# Patient Record
Sex: Female | Born: 1965 | Race: Black or African American | Hispanic: No | Smoking: Former smoker
Health system: Southern US, Community
[De-identification: ages and names within clinical notes are randomized; demographics above are authoritative.]

## PROBLEM LIST (undated history)

## (undated) DIAGNOSIS — N186 End stage renal disease: Secondary | ICD-10-CM

## (undated) DIAGNOSIS — I10 Essential (primary) hypertension: Secondary | ICD-10-CM

## (undated) DIAGNOSIS — J984 Other disorders of lung: Secondary | ICD-10-CM

## (undated) DIAGNOSIS — J449 Chronic obstructive pulmonary disease, unspecified: Secondary | ICD-10-CM

## (undated) DIAGNOSIS — J84116 Cryptogenic organizing pneumonia: Secondary | ICD-10-CM

## (undated) HISTORY — PX: OTHER SURGICAL HISTORY: SHX169

---

## 2006-11-15 ENCOUNTER — Encounter: Admission: RE | Admit: 2006-11-15 | Discharge: 2006-11-15 | Payer: Self-pay | Admitting: Sports Medicine

## 2007-10-19 ENCOUNTER — Encounter: Admission: RE | Admit: 2007-10-19 | Discharge: 2007-10-19 | Payer: Self-pay | Admitting: Orthopedic Surgery

## 2007-11-16 ENCOUNTER — Encounter: Admission: RE | Admit: 2007-11-16 | Discharge: 2007-11-16 | Payer: Self-pay | Admitting: Orthopedic Surgery

## 2008-01-07 ENCOUNTER — Emergency Department (HOSPITAL_COMMUNITY): Admission: EM | Admit: 2008-01-07 | Discharge: 2008-01-07 | Payer: Self-pay | Admitting: Emergency Medicine

## 2008-01-23 ENCOUNTER — Emergency Department (HOSPITAL_COMMUNITY): Admission: EM | Admit: 2008-01-23 | Discharge: 2008-01-23 | Payer: Self-pay | Admitting: Emergency Medicine

## 2008-04-04 ENCOUNTER — Emergency Department (HOSPITAL_COMMUNITY): Admission: EM | Admit: 2008-04-04 | Discharge: 2008-04-04 | Payer: Self-pay | Admitting: Emergency Medicine

## 2009-02-13 ENCOUNTER — Emergency Department (HOSPITAL_COMMUNITY): Admission: EM | Admit: 2009-02-13 | Discharge: 2009-02-13 | Payer: Self-pay | Admitting: Emergency Medicine

## 2009-05-22 ENCOUNTER — Emergency Department (HOSPITAL_COMMUNITY): Admission: EM | Admit: 2009-05-22 | Discharge: 2009-05-22 | Payer: Self-pay | Admitting: Emergency Medicine

## 2009-10-05 ENCOUNTER — Emergency Department (HOSPITAL_COMMUNITY): Admission: EM | Admit: 2009-10-05 | Discharge: 2009-10-05 | Payer: Self-pay | Admitting: Emergency Medicine

## 2009-10-16 ENCOUNTER — Emergency Department (HOSPITAL_COMMUNITY): Admission: EM | Admit: 2009-10-16 | Discharge: 2009-10-16 | Payer: Self-pay | Admitting: Emergency Medicine

## 2009-10-17 ENCOUNTER — Inpatient Hospital Stay (HOSPITAL_COMMUNITY): Admission: EM | Admit: 2009-10-17 | Discharge: 2009-10-23 | Payer: Self-pay | Admitting: Emergency Medicine

## 2010-04-17 LAB — CULTURE, BLOOD (SINGLE): Culture  Setup Time: 201109160839

## 2010-04-17 LAB — RENAL FUNCTION PANEL
Albumin: 3.2 g/dL — ABNORMAL LOW (ref 3.5–5.2)
Albumin: 3.4 g/dL — ABNORMAL LOW (ref 3.5–5.2)
BUN: 20 mg/dL (ref 6–23)
BUN: 30 mg/dL — ABNORMAL HIGH (ref 6–23)
BUN: 63 mg/dL — ABNORMAL HIGH (ref 6–23)
CO2: 28 mEq/L (ref 19–32)
Calcium: 8.3 mg/dL — ABNORMAL LOW (ref 8.4–10.5)
Calcium: 8.5 mg/dL (ref 8.4–10.5)
Chloride: 100 mEq/L (ref 96–112)
Chloride: 92 mEq/L — ABNORMAL LOW (ref 96–112)
Chloride: 99 mEq/L (ref 96–112)
Creatinine, Ser: 11.04 mg/dL — ABNORMAL HIGH (ref 0.4–1.2)
Creatinine, Ser: 16.44 mg/dL — ABNORMAL HIGH (ref 0.4–1.2)
Creatinine, Ser: 8.64 mg/dL — ABNORMAL HIGH (ref 0.4–1.2)
GFR calc Af Amer: 5 mL/min — ABNORMAL LOW (ref >60)
GFR calc Af Amer: 6 mL/min — ABNORMAL LOW (ref >60)
GFR calc non Af Amer: 4 mL/min — ABNORMAL LOW (ref >60)
GFR calc non Af Amer: 5 mL/min — ABNORMAL LOW (ref >60)
Glucose, Bld: 105 mg/dL — ABNORMAL HIGH (ref 70–99)
Phosphorus: 3.2 mg/dL (ref 2.3–4.6)

## 2010-04-17 LAB — CBC
HCT: 26.7 % — ABNORMAL LOW (ref 36.0–46.0)
HCT: 28.7 % — ABNORMAL LOW (ref 36.0–46.0)
Hemoglobin: 9.1 g/dL — ABNORMAL LOW (ref 12.0–15.0)
Hemoglobin: 9.3 g/dL — ABNORMAL LOW (ref 12.0–15.0)
Hemoglobin: 9.8 g/dL — ABNORMAL LOW (ref 12.0–15.0)
MCH: 28.5 pg (ref 26.0–34.0)
MCH: 28.5 pg (ref 26.0–34.0)
MCH: 28.7 pg (ref 26.0–34.0)
MCH: 28.9 pg (ref 26.0–34.0)
MCHC: 34.1 g/dL (ref 30.0–36.0)
MCHC: 34.1 g/dL (ref 30.0–36.0)
MCHC: 34.6 g/dL (ref 30.0–36.0)
MCHC: 34.9 g/dL (ref 30.0–36.0)
MCV: 81 fL (ref 78.0–100.0)
MCV: 83.7 fL (ref 78.0–100.0)
Platelets: 124 10*3/uL — ABNORMAL LOW (ref 150–400)
Platelets: 143 10*3/uL — ABNORMAL LOW (ref 150–400)
Platelets: 147 10*3/uL — ABNORMAL LOW (ref 150–400)
RBC: 3.19 MIL/uL — ABNORMAL LOW (ref 3.87–5.11)
RBC: 3.62 MIL/uL — ABNORMAL LOW (ref 3.87–5.11)
RDW: 16 % — ABNORMAL HIGH (ref 11.5–15.5)
RDW: 16.2 % — ABNORMAL HIGH (ref 11.5–15.5)
RDW: 16.2 % — ABNORMAL HIGH (ref 11.5–15.5)
RDW: 16.2 % — ABNORMAL HIGH (ref 11.5–15.5)
WBC: 4.4 10*3/uL (ref 4.0–10.5)
WBC: 6.4 10*3/uL (ref 4.0–10.5)

## 2010-04-17 LAB — DIFFERENTIAL
Basophils Absolute: 0 10*3/uL (ref 0.0–0.1)
Basophils Relative: 0 % (ref 0–1)
Lymphocytes Relative: 22 % (ref 12–46)
Neutro Abs: 5.1 10*3/uL (ref 1.7–7.7)
Neutrophils Relative %: 64 % (ref 43–77)

## 2010-04-17 LAB — BASIC METABOLIC PANEL WITH GFR
BUN: 42 mg/dL — ABNORMAL HIGH (ref 6–23)
CO2: 30 meq/L (ref 19–32)
Calcium: 9.2 mg/dL (ref 8.4–10.5)
Chloride: 94 meq/L — ABNORMAL LOW (ref 96–112)
Creatinine, Ser: 13.77 mg/dL — ABNORMAL HIGH (ref 0.4–1.2)
GFR calc non Af Amer: 3 mL/min — ABNORMAL LOW
Glucose, Bld: 102 mg/dL — ABNORMAL HIGH (ref 70–99)
Potassium: 5 meq/L (ref 3.5–5.1)
Sodium: 137 meq/L (ref 135–145)

## 2010-04-17 LAB — POCT I-STAT, CHEM 8
Calcium, Ion: 1.06 mmol/L — ABNORMAL LOW (ref 1.12–1.32)
Chloride: 99 mEq/L (ref 96–112)
Creatinine, Ser: 15.1 mg/dL — ABNORMAL HIGH (ref 0.4–1.2)
Potassium: 4.9 mEq/L (ref 3.5–5.1)
Sodium: 135 mEq/L (ref 135–145)
TCO2: 29 mmol/L (ref 0–100)

## 2010-04-17 LAB — BASIC METABOLIC PANEL
BUN: 14 mg/dL (ref 6–23)
BUN: 26 mg/dL — ABNORMAL HIGH (ref 6–23)
BUN: 66 mg/dL — ABNORMAL HIGH (ref 6–23)
CO2: 28 mEq/L (ref 19–32)
Calcium: 8.6 mg/dL (ref 8.4–10.5)
Calcium: 9.6 mg/dL (ref 8.4–10.5)
Chloride: 100 mEq/L (ref 96–112)
Chloride: 93 mEq/L — ABNORMAL LOW (ref 96–112)
Creatinine, Ser: 6.77 mg/dL — ABNORMAL HIGH (ref 0.4–1.2)
Creatinine, Ser: 9.34 mg/dL — ABNORMAL HIGH (ref 0.4–1.2)
Creatinine, Ser: 19.04 mg/dL — ABNORMAL HIGH (ref 0.4–1.2)
GFR calc non Af Amer: 7 mL/min — ABNORMAL LOW (ref >60)
Glucose, Bld: 90 mg/dL (ref 70–99)
Potassium: 4.5 mEq/L (ref 3.5–5.1)
Potassium: 4.7 mEq/L (ref 3.5–5.1)

## 2010-04-17 LAB — POCT I-STAT 4, (NA,K, GLUC, HGB,HCT)
Glucose, Bld: 95 mg/dL (ref 70–99)
HCT: 36 % (ref 36.0–46.0)
Hemoglobin: 12.2 g/dL (ref 12.0–15.0)
Potassium: 4.1 meq/L (ref 3.5–5.1)
Sodium: 137 meq/L (ref 135–145)

## 2010-04-17 LAB — CULTURE, BLOOD (ROUTINE X 2)
Culture  Setup Time: 201109181201
Culture  Setup Time: 201109181204
Culture: NO GROWTH

## 2010-04-17 LAB — BLOOD GAS, ARTERIAL
Acid-Base Excess: 5.5 mmol/L — ABNORMAL HIGH (ref 0.0–2.0)
Drawn by: 252031
O2 Content: 4 L/min
O2 Saturation: 85.1 %
pO2, Arterial: 51.8 mmHg — ABNORMAL LOW (ref 80.0–100.0)

## 2010-04-17 LAB — COMPREHENSIVE METABOLIC PANEL
ALT: <8 U/L (ref 0–35)
AST: 14 U/L (ref 0–37)
Albumin: 3.8 g/dL (ref 3.5–5.2)
BUN: 54 mg/dL — ABNORMAL HIGH (ref 6–23)
Chloride: 93 mEq/L — ABNORMAL LOW (ref 96–112)
GFR calc non Af Amer: 3 mL/min — ABNORMAL LOW (ref >60)
Potassium: 5.9 mEq/L — ABNORMAL HIGH (ref 3.5–5.1)
Sodium: 136 mEq/L (ref 135–145)
Total Protein: 7.7 g/dL (ref 6.0–8.3)

## 2010-04-17 LAB — PHOSPHORUS: Phosphorus: 4.5 mg/dL (ref 2.3–4.6)

## 2010-04-17 LAB — MRSA PCR SCREENING: MRSA by PCR: NEGATIVE

## 2010-04-17 LAB — VANCOMYCIN, RANDOM

## 2010-04-20 LAB — DIFFERENTIAL
Eosinophils Absolute: 0.5 10*3/uL (ref 0.0–0.7)
Eosinophils Relative: 7 % — ABNORMAL HIGH (ref 0–5)
Lymphs Abs: 0.7 10*3/uL (ref 0.7–4.0)
Monocytes Absolute: 0.6 10*3/uL (ref 0.1–1.0)
Monocytes Relative: 8 % (ref 3–12)

## 2010-04-20 LAB — POCT CARDIAC MARKERS
Myoglobin, poc: 251 ng/mL (ref 12–200)
Troponin i, poc: <0.05 ng/mL (ref 0.00–0.09)
Troponin i, poc: <0.05 ng/mL (ref 0.00–0.09)

## 2010-04-20 LAB — PROTIME-INR: INR: 1 (ref 0.00–1.49)

## 2010-04-20 LAB — CBC
HCT: 36 % (ref 36.0–46.0)
Hemoglobin: 12.3 g/dL (ref 12.0–15.0)
MCHC: 34.1 g/dL (ref 30.0–36.0)
MCV: 94.3 fL (ref 78.0–100.0)
RBC: 3.82 MIL/uL — ABNORMAL LOW (ref 3.87–5.11)
WBC: 7.3 10*3/uL (ref 4.0–10.5)

## 2010-04-20 LAB — POCT I-STAT, CHEM 8
BUN: 19 mg/dL (ref 6–23)
Calcium, Ion: 1.24 mmol/L (ref 1.12–1.32)
Chloride: 96 mEq/L (ref 96–112)
Creatinine, Ser: 8.3 mg/dL — ABNORMAL HIGH (ref 0.4–1.2)
Glucose, Bld: 85 mg/dL (ref 70–99)

## 2010-04-22 LAB — CBC
HCT: 32.5 % — ABNORMAL LOW (ref 36.0–46.0)
Platelets: 160 10*3/uL (ref 150–400)
RDW: 15.6 % — ABNORMAL HIGH (ref 11.5–15.5)

## 2010-04-22 LAB — DIFFERENTIAL
Basophils Absolute: 0 10*3/uL (ref 0.0–0.1)
Eosinophils Absolute: 0.5 10*3/uL (ref 0.0–0.7)
Eosinophils Relative: 6 % — ABNORMAL HIGH (ref 0–5)
Lymphocytes Relative: 19 % (ref 12–46)

## 2010-04-22 LAB — POCT I-STAT, CHEM 8
Hemoglobin: 11.9 g/dL — ABNORMAL LOW (ref 12.0–15.0)
Sodium: 134 mEq/L — ABNORMAL LOW (ref 135–145)
TCO2: 34 mmol/L (ref 0–100)

## 2010-05-15 LAB — BASIC METABOLIC PANEL
Calcium: 10.6 mg/dL — ABNORMAL HIGH (ref 8.4–10.5)
GFR calc Af Amer: 4 mL/min — ABNORMAL LOW (ref >60)
GFR calc non Af Amer: 3 mL/min — ABNORMAL LOW (ref >60)
Sodium: 135 mEq/L (ref 135–145)

## 2010-05-15 LAB — CBC
Hemoglobin: 13.5 g/dL (ref 12.0–15.0)
RBC: 4.31 MIL/uL (ref 3.87–5.11)

## 2010-11-07 LAB — POCT I-STAT, CHEM 8
BUN: 46 mg/dL — ABNORMAL HIGH (ref 6–23)
Calcium, Ion: 1.15 mmol/L (ref 1.12–1.32)
Chloride: 97 mEq/L (ref 96–112)
Creatinine, Ser: 10.4 mg/dL — ABNORMAL HIGH (ref 0.4–1.2)
Glucose, Bld: 76 mg/dL (ref 70–99)
HCT: 43 % (ref 36.0–46.0)
Hemoglobin: 14.6 g/dL (ref 12.0–15.0)
Potassium: 5.4 mEq/L — ABNORMAL HIGH (ref 3.5–5.1)
Sodium: 136 meq/L (ref 135–145)
TCO2: 36 mmol/L (ref 0–100)

## 2011-06-25 ENCOUNTER — Other Ambulatory Visit: Payer: Self-pay | Admitting: Nephrology

## 2013-02-03 DIAGNOSIS — D631 Anemia in chronic kidney disease: Secondary | ICD-10-CM | POA: Diagnosis not present

## 2013-02-03 DIAGNOSIS — L299 Pruritus, unspecified: Secondary | ICD-10-CM | POA: Diagnosis not present

## 2013-02-03 DIAGNOSIS — N039 Chronic nephritic syndrome with unspecified morphologic changes: Secondary | ICD-10-CM | POA: Diagnosis not present

## 2013-02-03 DIAGNOSIS — N186 End stage renal disease: Secondary | ICD-10-CM | POA: Diagnosis not present

## 2013-02-04 DIAGNOSIS — L299 Pruritus, unspecified: Secondary | ICD-10-CM | POA: Diagnosis not present

## 2013-02-04 DIAGNOSIS — D631 Anemia in chronic kidney disease: Secondary | ICD-10-CM | POA: Diagnosis not present

## 2013-02-04 DIAGNOSIS — N186 End stage renal disease: Secondary | ICD-10-CM | POA: Diagnosis not present

## 2013-02-07 DIAGNOSIS — N186 End stage renal disease: Secondary | ICD-10-CM | POA: Diagnosis not present

## 2013-02-07 DIAGNOSIS — N039 Chronic nephritic syndrome with unspecified morphologic changes: Secondary | ICD-10-CM | POA: Diagnosis not present

## 2013-02-07 DIAGNOSIS — D631 Anemia in chronic kidney disease: Secondary | ICD-10-CM | POA: Diagnosis not present

## 2013-02-07 DIAGNOSIS — L299 Pruritus, unspecified: Secondary | ICD-10-CM | POA: Diagnosis not present

## 2013-02-09 DIAGNOSIS — N186 End stage renal disease: Secondary | ICD-10-CM | POA: Diagnosis not present

## 2013-02-09 DIAGNOSIS — N039 Chronic nephritic syndrome with unspecified morphologic changes: Secondary | ICD-10-CM | POA: Diagnosis not present

## 2013-02-09 DIAGNOSIS — D631 Anemia in chronic kidney disease: Secondary | ICD-10-CM | POA: Diagnosis not present

## 2013-02-09 DIAGNOSIS — L299 Pruritus, unspecified: Secondary | ICD-10-CM | POA: Diagnosis not present

## 2013-02-11 DIAGNOSIS — N186 End stage renal disease: Secondary | ICD-10-CM | POA: Diagnosis not present

## 2013-02-11 DIAGNOSIS — L299 Pruritus, unspecified: Secondary | ICD-10-CM | POA: Diagnosis not present

## 2013-02-11 DIAGNOSIS — D631 Anemia in chronic kidney disease: Secondary | ICD-10-CM | POA: Diagnosis not present

## 2013-02-14 DIAGNOSIS — N039 Chronic nephritic syndrome with unspecified morphologic changes: Secondary | ICD-10-CM | POA: Diagnosis not present

## 2013-02-14 DIAGNOSIS — L299 Pruritus, unspecified: Secondary | ICD-10-CM | POA: Diagnosis not present

## 2013-02-14 DIAGNOSIS — N186 End stage renal disease: Secondary | ICD-10-CM | POA: Diagnosis not present

## 2013-02-14 DIAGNOSIS — D631 Anemia in chronic kidney disease: Secondary | ICD-10-CM | POA: Diagnosis not present

## 2013-02-16 DIAGNOSIS — N186 End stage renal disease: Secondary | ICD-10-CM | POA: Diagnosis not present

## 2013-02-16 DIAGNOSIS — D631 Anemia in chronic kidney disease: Secondary | ICD-10-CM | POA: Diagnosis not present

## 2013-02-16 DIAGNOSIS — L299 Pruritus, unspecified: Secondary | ICD-10-CM | POA: Diagnosis not present

## 2013-02-17 DIAGNOSIS — N186 End stage renal disease: Secondary | ICD-10-CM | POA: Diagnosis not present

## 2013-02-17 DIAGNOSIS — D631 Anemia in chronic kidney disease: Secondary | ICD-10-CM | POA: Diagnosis not present

## 2013-02-20 DIAGNOSIS — N186 End stage renal disease: Secondary | ICD-10-CM | POA: Diagnosis not present

## 2013-02-20 DIAGNOSIS — D631 Anemia in chronic kidney disease: Secondary | ICD-10-CM | POA: Diagnosis not present

## 2013-02-20 DIAGNOSIS — N039 Chronic nephritic syndrome with unspecified morphologic changes: Secondary | ICD-10-CM | POA: Diagnosis not present

## 2013-02-22 DIAGNOSIS — N186 End stage renal disease: Secondary | ICD-10-CM | POA: Diagnosis not present

## 2013-02-22 DIAGNOSIS — D631 Anemia in chronic kidney disease: Secondary | ICD-10-CM | POA: Diagnosis not present

## 2013-02-24 DIAGNOSIS — N186 End stage renal disease: Secondary | ICD-10-CM | POA: Diagnosis not present

## 2013-02-24 DIAGNOSIS — D631 Anemia in chronic kidney disease: Secondary | ICD-10-CM | POA: Diagnosis not present

## 2013-02-27 DIAGNOSIS — N186 End stage renal disease: Secondary | ICD-10-CM | POA: Diagnosis not present

## 2013-02-27 DIAGNOSIS — N039 Chronic nephritic syndrome with unspecified morphologic changes: Secondary | ICD-10-CM | POA: Diagnosis not present

## 2013-02-27 DIAGNOSIS — D631 Anemia in chronic kidney disease: Secondary | ICD-10-CM | POA: Diagnosis not present

## 2013-03-01 DIAGNOSIS — D631 Anemia in chronic kidney disease: Secondary | ICD-10-CM | POA: Diagnosis not present

## 2013-03-01 DIAGNOSIS — N186 End stage renal disease: Secondary | ICD-10-CM | POA: Diagnosis not present

## 2013-03-03 DIAGNOSIS — D631 Anemia in chronic kidney disease: Secondary | ICD-10-CM | POA: Diagnosis not present

## 2013-03-03 DIAGNOSIS — N186 End stage renal disease: Secondary | ICD-10-CM | POA: Diagnosis not present

## 2013-03-04 DIAGNOSIS — N186 End stage renal disease: Secondary | ICD-10-CM | POA: Diagnosis not present

## 2013-03-06 DIAGNOSIS — N186 End stage renal disease: Secondary | ICD-10-CM | POA: Diagnosis not present

## 2013-03-06 DIAGNOSIS — N039 Chronic nephritic syndrome with unspecified morphologic changes: Secondary | ICD-10-CM | POA: Diagnosis not present

## 2013-03-06 DIAGNOSIS — D631 Anemia in chronic kidney disease: Secondary | ICD-10-CM | POA: Diagnosis not present

## 2013-03-08 DIAGNOSIS — N186 End stage renal disease: Secondary | ICD-10-CM | POA: Diagnosis not present

## 2013-03-08 DIAGNOSIS — N039 Chronic nephritic syndrome with unspecified morphologic changes: Secondary | ICD-10-CM | POA: Diagnosis not present

## 2013-03-08 DIAGNOSIS — D631 Anemia in chronic kidney disease: Secondary | ICD-10-CM | POA: Diagnosis not present

## 2013-03-10 DIAGNOSIS — N039 Chronic nephritic syndrome with unspecified morphologic changes: Secondary | ICD-10-CM | POA: Diagnosis not present

## 2013-03-10 DIAGNOSIS — D631 Anemia in chronic kidney disease: Secondary | ICD-10-CM | POA: Diagnosis not present

## 2013-03-10 DIAGNOSIS — N186 End stage renal disease: Secondary | ICD-10-CM | POA: Diagnosis not present

## 2013-03-13 DIAGNOSIS — N186 End stage renal disease: Secondary | ICD-10-CM | POA: Diagnosis not present

## 2013-03-13 DIAGNOSIS — D631 Anemia in chronic kidney disease: Secondary | ICD-10-CM | POA: Diagnosis not present

## 2013-03-15 DIAGNOSIS — D631 Anemia in chronic kidney disease: Secondary | ICD-10-CM | POA: Diagnosis not present

## 2013-03-15 DIAGNOSIS — N186 End stage renal disease: Secondary | ICD-10-CM | POA: Diagnosis not present

## 2013-03-15 DIAGNOSIS — N039 Chronic nephritic syndrome with unspecified morphologic changes: Secondary | ICD-10-CM | POA: Diagnosis not present

## 2013-03-17 DIAGNOSIS — N039 Chronic nephritic syndrome with unspecified morphologic changes: Secondary | ICD-10-CM | POA: Diagnosis not present

## 2013-03-17 DIAGNOSIS — D631 Anemia in chronic kidney disease: Secondary | ICD-10-CM | POA: Diagnosis not present

## 2013-03-17 DIAGNOSIS — N186 End stage renal disease: Secondary | ICD-10-CM | POA: Diagnosis not present

## 2013-03-20 DIAGNOSIS — D631 Anemia in chronic kidney disease: Secondary | ICD-10-CM | POA: Diagnosis not present

## 2013-03-20 DIAGNOSIS — N186 End stage renal disease: Secondary | ICD-10-CM | POA: Diagnosis not present

## 2013-03-22 DIAGNOSIS — N039 Chronic nephritic syndrome with unspecified morphologic changes: Secondary | ICD-10-CM | POA: Diagnosis not present

## 2013-03-22 DIAGNOSIS — N186 End stage renal disease: Secondary | ICD-10-CM | POA: Diagnosis not present

## 2013-03-22 DIAGNOSIS — D631 Anemia in chronic kidney disease: Secondary | ICD-10-CM | POA: Diagnosis not present

## 2013-03-24 DIAGNOSIS — N186 End stage renal disease: Secondary | ICD-10-CM | POA: Diagnosis not present

## 2013-03-24 DIAGNOSIS — D631 Anemia in chronic kidney disease: Secondary | ICD-10-CM | POA: Diagnosis not present

## 2013-03-27 DIAGNOSIS — D631 Anemia in chronic kidney disease: Secondary | ICD-10-CM | POA: Diagnosis not present

## 2013-03-27 DIAGNOSIS — N039 Chronic nephritic syndrome with unspecified morphologic changes: Secondary | ICD-10-CM | POA: Diagnosis not present

## 2013-03-27 DIAGNOSIS — N186 End stage renal disease: Secondary | ICD-10-CM | POA: Diagnosis not present

## 2013-03-29 DIAGNOSIS — D631 Anemia in chronic kidney disease: Secondary | ICD-10-CM | POA: Diagnosis not present

## 2013-03-29 DIAGNOSIS — N186 End stage renal disease: Secondary | ICD-10-CM | POA: Diagnosis not present

## 2013-03-31 DIAGNOSIS — N039 Chronic nephritic syndrome with unspecified morphologic changes: Secondary | ICD-10-CM | POA: Diagnosis not present

## 2013-03-31 DIAGNOSIS — D631 Anemia in chronic kidney disease: Secondary | ICD-10-CM | POA: Diagnosis not present

## 2013-03-31 DIAGNOSIS — N186 End stage renal disease: Secondary | ICD-10-CM | POA: Diagnosis not present

## 2013-04-01 DIAGNOSIS — N186 End stage renal disease: Secondary | ICD-10-CM | POA: Diagnosis not present

## 2013-04-03 DIAGNOSIS — D631 Anemia in chronic kidney disease: Secondary | ICD-10-CM | POA: Diagnosis not present

## 2013-04-03 DIAGNOSIS — N186 End stage renal disease: Secondary | ICD-10-CM | POA: Diagnosis not present

## 2013-04-05 DIAGNOSIS — N186 End stage renal disease: Secondary | ICD-10-CM | POA: Diagnosis not present

## 2013-04-05 DIAGNOSIS — D631 Anemia in chronic kidney disease: Secondary | ICD-10-CM | POA: Diagnosis not present

## 2013-04-05 DIAGNOSIS — N039 Chronic nephritic syndrome with unspecified morphologic changes: Secondary | ICD-10-CM | POA: Diagnosis not present

## 2013-04-07 DIAGNOSIS — N039 Chronic nephritic syndrome with unspecified morphologic changes: Secondary | ICD-10-CM | POA: Diagnosis not present

## 2013-04-07 DIAGNOSIS — N186 End stage renal disease: Secondary | ICD-10-CM | POA: Diagnosis not present

## 2013-04-07 DIAGNOSIS — D631 Anemia in chronic kidney disease: Secondary | ICD-10-CM | POA: Diagnosis not present

## 2013-04-10 DIAGNOSIS — N039 Chronic nephritic syndrome with unspecified morphologic changes: Secondary | ICD-10-CM | POA: Diagnosis not present

## 2013-04-10 DIAGNOSIS — D631 Anemia in chronic kidney disease: Secondary | ICD-10-CM | POA: Diagnosis not present

## 2013-04-10 DIAGNOSIS — N186 End stage renal disease: Secondary | ICD-10-CM | POA: Diagnosis not present

## 2013-04-12 DIAGNOSIS — D631 Anemia in chronic kidney disease: Secondary | ICD-10-CM | POA: Diagnosis not present

## 2013-04-12 DIAGNOSIS — N186 End stage renal disease: Secondary | ICD-10-CM | POA: Diagnosis not present

## 2013-04-14 DIAGNOSIS — N186 End stage renal disease: Secondary | ICD-10-CM | POA: Diagnosis not present

## 2013-04-14 DIAGNOSIS — D631 Anemia in chronic kidney disease: Secondary | ICD-10-CM | POA: Diagnosis not present

## 2013-04-17 DIAGNOSIS — N186 End stage renal disease: Secondary | ICD-10-CM | POA: Diagnosis not present

## 2013-04-17 DIAGNOSIS — D631 Anemia in chronic kidney disease: Secondary | ICD-10-CM | POA: Diagnosis not present

## 2013-04-19 DIAGNOSIS — N186 End stage renal disease: Secondary | ICD-10-CM | POA: Diagnosis not present

## 2013-04-19 DIAGNOSIS — D631 Anemia in chronic kidney disease: Secondary | ICD-10-CM | POA: Diagnosis not present

## 2013-04-21 DIAGNOSIS — N039 Chronic nephritic syndrome with unspecified morphologic changes: Secondary | ICD-10-CM | POA: Diagnosis not present

## 2013-04-21 DIAGNOSIS — D631 Anemia in chronic kidney disease: Secondary | ICD-10-CM | POA: Diagnosis not present

## 2013-04-21 DIAGNOSIS — N186 End stage renal disease: Secondary | ICD-10-CM | POA: Diagnosis not present

## 2013-04-24 DIAGNOSIS — N186 End stage renal disease: Secondary | ICD-10-CM | POA: Diagnosis not present

## 2013-04-24 DIAGNOSIS — D631 Anemia in chronic kidney disease: Secondary | ICD-10-CM | POA: Diagnosis not present

## 2013-04-26 DIAGNOSIS — D631 Anemia in chronic kidney disease: Secondary | ICD-10-CM | POA: Diagnosis not present

## 2013-04-26 DIAGNOSIS — N186 End stage renal disease: Secondary | ICD-10-CM | POA: Diagnosis not present

## 2013-04-28 DIAGNOSIS — D631 Anemia in chronic kidney disease: Secondary | ICD-10-CM | POA: Diagnosis not present

## 2013-04-28 DIAGNOSIS — N186 End stage renal disease: Secondary | ICD-10-CM | POA: Diagnosis not present

## 2013-04-28 DIAGNOSIS — N039 Chronic nephritic syndrome with unspecified morphologic changes: Secondary | ICD-10-CM | POA: Diagnosis not present

## 2013-05-01 DIAGNOSIS — N186 End stage renal disease: Secondary | ICD-10-CM | POA: Diagnosis not present

## 2013-05-01 DIAGNOSIS — D631 Anemia in chronic kidney disease: Secondary | ICD-10-CM | POA: Diagnosis not present

## 2013-05-02 DIAGNOSIS — N186 End stage renal disease: Secondary | ICD-10-CM | POA: Diagnosis not present

## 2013-05-03 DIAGNOSIS — D631 Anemia in chronic kidney disease: Secondary | ICD-10-CM | POA: Diagnosis not present

## 2013-05-03 DIAGNOSIS — N039 Chronic nephritic syndrome with unspecified morphologic changes: Secondary | ICD-10-CM | POA: Diagnosis not present

## 2013-05-03 DIAGNOSIS — N186 End stage renal disease: Secondary | ICD-10-CM | POA: Diagnosis not present

## 2013-05-04 DIAGNOSIS — J449 Chronic obstructive pulmonary disease, unspecified: Secondary | ICD-10-CM | POA: Diagnosis not present

## 2013-05-05 DIAGNOSIS — N039 Chronic nephritic syndrome with unspecified morphologic changes: Secondary | ICD-10-CM | POA: Diagnosis not present

## 2013-05-05 DIAGNOSIS — N186 End stage renal disease: Secondary | ICD-10-CM | POA: Diagnosis not present

## 2013-05-05 DIAGNOSIS — D631 Anemia in chronic kidney disease: Secondary | ICD-10-CM | POA: Diagnosis not present

## 2013-05-08 DIAGNOSIS — D631 Anemia in chronic kidney disease: Secondary | ICD-10-CM | POA: Diagnosis not present

## 2013-05-08 DIAGNOSIS — N186 End stage renal disease: Secondary | ICD-10-CM | POA: Diagnosis not present

## 2013-05-10 DIAGNOSIS — N186 End stage renal disease: Secondary | ICD-10-CM | POA: Diagnosis not present

## 2013-05-10 DIAGNOSIS — D631 Anemia in chronic kidney disease: Secondary | ICD-10-CM | POA: Diagnosis not present

## 2013-05-12 DIAGNOSIS — N186 End stage renal disease: Secondary | ICD-10-CM | POA: Diagnosis not present

## 2013-05-12 DIAGNOSIS — N039 Chronic nephritic syndrome with unspecified morphologic changes: Secondary | ICD-10-CM | POA: Diagnosis not present

## 2013-05-12 DIAGNOSIS — D631 Anemia in chronic kidney disease: Secondary | ICD-10-CM | POA: Diagnosis not present

## 2013-05-15 DIAGNOSIS — N186 End stage renal disease: Secondary | ICD-10-CM | POA: Diagnosis not present

## 2013-05-15 DIAGNOSIS — D631 Anemia in chronic kidney disease: Secondary | ICD-10-CM | POA: Diagnosis not present

## 2013-05-17 DIAGNOSIS — D631 Anemia in chronic kidney disease: Secondary | ICD-10-CM | POA: Diagnosis not present

## 2013-05-17 DIAGNOSIS — N186 End stage renal disease: Secondary | ICD-10-CM | POA: Diagnosis not present

## 2013-05-19 DIAGNOSIS — N039 Chronic nephritic syndrome with unspecified morphologic changes: Secondary | ICD-10-CM | POA: Diagnosis not present

## 2013-05-19 DIAGNOSIS — D631 Anemia in chronic kidney disease: Secondary | ICD-10-CM | POA: Diagnosis not present

## 2013-05-19 DIAGNOSIS — N186 End stage renal disease: Secondary | ICD-10-CM | POA: Diagnosis not present

## 2013-05-22 DIAGNOSIS — D631 Anemia in chronic kidney disease: Secondary | ICD-10-CM | POA: Diagnosis not present

## 2013-05-22 DIAGNOSIS — N039 Chronic nephritic syndrome with unspecified morphologic changes: Secondary | ICD-10-CM | POA: Diagnosis not present

## 2013-05-22 DIAGNOSIS — N186 End stage renal disease: Secondary | ICD-10-CM | POA: Diagnosis not present

## 2013-05-24 DIAGNOSIS — N039 Chronic nephritic syndrome with unspecified morphologic changes: Secondary | ICD-10-CM | POA: Diagnosis not present

## 2013-05-24 DIAGNOSIS — N186 End stage renal disease: Secondary | ICD-10-CM | POA: Diagnosis not present

## 2013-05-24 DIAGNOSIS — D631 Anemia in chronic kidney disease: Secondary | ICD-10-CM | POA: Diagnosis not present

## 2013-05-26 DIAGNOSIS — D631 Anemia in chronic kidney disease: Secondary | ICD-10-CM | POA: Diagnosis not present

## 2013-05-26 DIAGNOSIS — N186 End stage renal disease: Secondary | ICD-10-CM | POA: Diagnosis not present

## 2013-05-26 DIAGNOSIS — N039 Chronic nephritic syndrome with unspecified morphologic changes: Secondary | ICD-10-CM | POA: Diagnosis not present

## 2013-05-29 DIAGNOSIS — D631 Anemia in chronic kidney disease: Secondary | ICD-10-CM | POA: Diagnosis not present

## 2013-05-29 DIAGNOSIS — N186 End stage renal disease: Secondary | ICD-10-CM | POA: Diagnosis not present

## 2013-05-31 DIAGNOSIS — I871 Compression of vein: Secondary | ICD-10-CM | POA: Diagnosis not present

## 2013-05-31 DIAGNOSIS — I771 Stricture of artery: Secondary | ICD-10-CM | POA: Diagnosis not present

## 2013-05-31 DIAGNOSIS — T82898A Other specified complication of vascular prosthetic devices, implants and grafts, initial encounter: Secondary | ICD-10-CM | POA: Diagnosis not present

## 2013-06-01 DIAGNOSIS — N186 End stage renal disease: Secondary | ICD-10-CM | POA: Diagnosis not present

## 2013-06-01 DIAGNOSIS — T82898A Other specified complication of vascular prosthetic devices, implants and grafts, initial encounter: Secondary | ICD-10-CM | POA: Diagnosis not present

## 2013-06-01 DIAGNOSIS — I871 Compression of vein: Secondary | ICD-10-CM | POA: Diagnosis not present

## 2013-06-02 DIAGNOSIS — N039 Chronic nephritic syndrome with unspecified morphologic changes: Secondary | ICD-10-CM | POA: Diagnosis not present

## 2013-06-02 DIAGNOSIS — T827XXA Infection and inflammatory reaction due to other cardiac and vascular devices, implants and grafts, initial encounter: Secondary | ICD-10-CM | POA: Diagnosis not present

## 2013-06-02 DIAGNOSIS — D631 Anemia in chronic kidney disease: Secondary | ICD-10-CM | POA: Diagnosis not present

## 2013-06-02 DIAGNOSIS — N186 End stage renal disease: Secondary | ICD-10-CM | POA: Diagnosis not present

## 2013-06-05 DIAGNOSIS — N186 End stage renal disease: Secondary | ICD-10-CM | POA: Diagnosis not present

## 2013-06-05 DIAGNOSIS — T827XXA Infection and inflammatory reaction due to other cardiac and vascular devices, implants and grafts, initial encounter: Secondary | ICD-10-CM | POA: Diagnosis not present

## 2013-06-05 DIAGNOSIS — N039 Chronic nephritic syndrome with unspecified morphologic changes: Secondary | ICD-10-CM | POA: Diagnosis not present

## 2013-06-05 DIAGNOSIS — D631 Anemia in chronic kidney disease: Secondary | ICD-10-CM | POA: Diagnosis not present

## 2013-06-07 DIAGNOSIS — D631 Anemia in chronic kidney disease: Secondary | ICD-10-CM | POA: Diagnosis not present

## 2013-06-07 DIAGNOSIS — N039 Chronic nephritic syndrome with unspecified morphologic changes: Secondary | ICD-10-CM | POA: Diagnosis not present

## 2013-06-07 DIAGNOSIS — T827XXA Infection and inflammatory reaction due to other cardiac and vascular devices, implants and grafts, initial encounter: Secondary | ICD-10-CM | POA: Diagnosis not present

## 2013-06-07 DIAGNOSIS — N186 End stage renal disease: Secondary | ICD-10-CM | POA: Diagnosis not present

## 2013-06-09 DIAGNOSIS — N186 End stage renal disease: Secondary | ICD-10-CM | POA: Diagnosis not present

## 2013-06-09 DIAGNOSIS — T827XXA Infection and inflammatory reaction due to other cardiac and vascular devices, implants and grafts, initial encounter: Secondary | ICD-10-CM | POA: Diagnosis not present

## 2013-06-09 DIAGNOSIS — D631 Anemia in chronic kidney disease: Secondary | ICD-10-CM | POA: Diagnosis not present

## 2013-06-12 DIAGNOSIS — N186 End stage renal disease: Secondary | ICD-10-CM | POA: Diagnosis not present

## 2013-06-12 DIAGNOSIS — T827XXA Infection and inflammatory reaction due to other cardiac and vascular devices, implants and grafts, initial encounter: Secondary | ICD-10-CM | POA: Diagnosis not present

## 2013-06-12 DIAGNOSIS — D631 Anemia in chronic kidney disease: Secondary | ICD-10-CM | POA: Diagnosis not present

## 2013-06-14 DIAGNOSIS — D631 Anemia in chronic kidney disease: Secondary | ICD-10-CM | POA: Diagnosis not present

## 2013-06-14 DIAGNOSIS — N186 End stage renal disease: Secondary | ICD-10-CM | POA: Diagnosis not present

## 2013-06-14 DIAGNOSIS — T827XXA Infection and inflammatory reaction due to other cardiac and vascular devices, implants and grafts, initial encounter: Secondary | ICD-10-CM | POA: Diagnosis not present

## 2013-06-16 DIAGNOSIS — D631 Anemia in chronic kidney disease: Secondary | ICD-10-CM | POA: Diagnosis not present

## 2013-06-16 DIAGNOSIS — N186 End stage renal disease: Secondary | ICD-10-CM | POA: Diagnosis not present

## 2013-06-16 DIAGNOSIS — T827XXA Infection and inflammatory reaction due to other cardiac and vascular devices, implants and grafts, initial encounter: Secondary | ICD-10-CM | POA: Diagnosis not present

## 2013-06-19 DIAGNOSIS — D631 Anemia in chronic kidney disease: Secondary | ICD-10-CM | POA: Diagnosis not present

## 2013-06-19 DIAGNOSIS — N186 End stage renal disease: Secondary | ICD-10-CM | POA: Diagnosis not present

## 2013-06-19 DIAGNOSIS — T827XXA Infection and inflammatory reaction due to other cardiac and vascular devices, implants and grafts, initial encounter: Secondary | ICD-10-CM | POA: Diagnosis not present

## 2013-06-21 DIAGNOSIS — D631 Anemia in chronic kidney disease: Secondary | ICD-10-CM | POA: Diagnosis not present

## 2013-06-21 DIAGNOSIS — N186 End stage renal disease: Secondary | ICD-10-CM | POA: Diagnosis not present

## 2013-06-21 DIAGNOSIS — T827XXA Infection and inflammatory reaction due to other cardiac and vascular devices, implants and grafts, initial encounter: Secondary | ICD-10-CM | POA: Diagnosis not present

## 2013-06-22 DIAGNOSIS — T82898A Other specified complication of vascular prosthetic devices, implants and grafts, initial encounter: Secondary | ICD-10-CM | POA: Diagnosis not present

## 2013-06-22 DIAGNOSIS — I871 Compression of vein: Secondary | ICD-10-CM | POA: Diagnosis not present

## 2013-06-23 DIAGNOSIS — T827XXA Infection and inflammatory reaction due to other cardiac and vascular devices, implants and grafts, initial encounter: Secondary | ICD-10-CM | POA: Diagnosis not present

## 2013-06-23 DIAGNOSIS — N039 Chronic nephritic syndrome with unspecified morphologic changes: Secondary | ICD-10-CM | POA: Diagnosis not present

## 2013-06-23 DIAGNOSIS — D631 Anemia in chronic kidney disease: Secondary | ICD-10-CM | POA: Diagnosis not present

## 2013-06-23 DIAGNOSIS — N186 End stage renal disease: Secondary | ICD-10-CM | POA: Diagnosis not present

## 2013-06-26 DIAGNOSIS — N039 Chronic nephritic syndrome with unspecified morphologic changes: Secondary | ICD-10-CM | POA: Diagnosis not present

## 2013-06-26 DIAGNOSIS — D631 Anemia in chronic kidney disease: Secondary | ICD-10-CM | POA: Diagnosis not present

## 2013-06-26 DIAGNOSIS — N186 End stage renal disease: Secondary | ICD-10-CM | POA: Diagnosis not present

## 2013-06-26 DIAGNOSIS — T827XXA Infection and inflammatory reaction due to other cardiac and vascular devices, implants and grafts, initial encounter: Secondary | ICD-10-CM | POA: Diagnosis not present

## 2013-06-28 DIAGNOSIS — N039 Chronic nephritic syndrome with unspecified morphologic changes: Secondary | ICD-10-CM | POA: Diagnosis not present

## 2013-06-28 DIAGNOSIS — N186 End stage renal disease: Secondary | ICD-10-CM | POA: Diagnosis not present

## 2013-06-28 DIAGNOSIS — T827XXA Infection and inflammatory reaction due to other cardiac and vascular devices, implants and grafts, initial encounter: Secondary | ICD-10-CM | POA: Diagnosis not present

## 2013-06-28 DIAGNOSIS — D631 Anemia in chronic kidney disease: Secondary | ICD-10-CM | POA: Diagnosis not present

## 2013-06-30 DIAGNOSIS — D631 Anemia in chronic kidney disease: Secondary | ICD-10-CM | POA: Diagnosis not present

## 2013-06-30 DIAGNOSIS — N186 End stage renal disease: Secondary | ICD-10-CM | POA: Diagnosis not present

## 2013-06-30 DIAGNOSIS — T827XXA Infection and inflammatory reaction due to other cardiac and vascular devices, implants and grafts, initial encounter: Secondary | ICD-10-CM | POA: Diagnosis not present

## 2013-07-02 DIAGNOSIS — N186 End stage renal disease: Secondary | ICD-10-CM | POA: Diagnosis not present

## 2013-07-03 DIAGNOSIS — N2581 Secondary hyperparathyroidism of renal origin: Secondary | ICD-10-CM | POA: Diagnosis not present

## 2013-07-03 DIAGNOSIS — N039 Chronic nephritic syndrome with unspecified morphologic changes: Secondary | ICD-10-CM | POA: Diagnosis not present

## 2013-07-03 DIAGNOSIS — N186 End stage renal disease: Secondary | ICD-10-CM | POA: Diagnosis not present

## 2013-07-03 DIAGNOSIS — D631 Anemia in chronic kidney disease: Secondary | ICD-10-CM | POA: Diagnosis not present

## 2013-07-05 DIAGNOSIS — N2581 Secondary hyperparathyroidism of renal origin: Secondary | ICD-10-CM | POA: Diagnosis not present

## 2013-07-05 DIAGNOSIS — D631 Anemia in chronic kidney disease: Secondary | ICD-10-CM | POA: Diagnosis not present

## 2013-07-05 DIAGNOSIS — N186 End stage renal disease: Secondary | ICD-10-CM | POA: Diagnosis not present

## 2013-07-07 DIAGNOSIS — N186 End stage renal disease: Secondary | ICD-10-CM | POA: Diagnosis not present

## 2013-07-07 DIAGNOSIS — N2581 Secondary hyperparathyroidism of renal origin: Secondary | ICD-10-CM | POA: Diagnosis not present

## 2013-07-07 DIAGNOSIS — D631 Anemia in chronic kidney disease: Secondary | ICD-10-CM | POA: Diagnosis not present

## 2013-07-10 DIAGNOSIS — N186 End stage renal disease: Secondary | ICD-10-CM | POA: Diagnosis not present

## 2013-07-10 DIAGNOSIS — D631 Anemia in chronic kidney disease: Secondary | ICD-10-CM | POA: Diagnosis not present

## 2013-07-10 DIAGNOSIS — N2581 Secondary hyperparathyroidism of renal origin: Secondary | ICD-10-CM | POA: Diagnosis not present

## 2013-07-12 DIAGNOSIS — N2581 Secondary hyperparathyroidism of renal origin: Secondary | ICD-10-CM | POA: Diagnosis not present

## 2013-07-12 DIAGNOSIS — D631 Anemia in chronic kidney disease: Secondary | ICD-10-CM | POA: Diagnosis not present

## 2013-07-12 DIAGNOSIS — N186 End stage renal disease: Secondary | ICD-10-CM | POA: Diagnosis not present

## 2013-07-14 DIAGNOSIS — N186 End stage renal disease: Secondary | ICD-10-CM | POA: Diagnosis not present

## 2013-07-14 DIAGNOSIS — N2581 Secondary hyperparathyroidism of renal origin: Secondary | ICD-10-CM | POA: Diagnosis not present

## 2013-07-14 DIAGNOSIS — D631 Anemia in chronic kidney disease: Secondary | ICD-10-CM | POA: Diagnosis not present

## 2013-07-14 DIAGNOSIS — N039 Chronic nephritic syndrome with unspecified morphologic changes: Secondary | ICD-10-CM | POA: Diagnosis not present

## 2013-07-17 DIAGNOSIS — N039 Chronic nephritic syndrome with unspecified morphologic changes: Secondary | ICD-10-CM | POA: Diagnosis not present

## 2013-07-17 DIAGNOSIS — D631 Anemia in chronic kidney disease: Secondary | ICD-10-CM | POA: Diagnosis not present

## 2013-07-17 DIAGNOSIS — N2581 Secondary hyperparathyroidism of renal origin: Secondary | ICD-10-CM | POA: Diagnosis not present

## 2013-07-17 DIAGNOSIS — N186 End stage renal disease: Secondary | ICD-10-CM | POA: Diagnosis not present

## 2013-07-19 DIAGNOSIS — D631 Anemia in chronic kidney disease: Secondary | ICD-10-CM | POA: Diagnosis not present

## 2013-07-19 DIAGNOSIS — N186 End stage renal disease: Secondary | ICD-10-CM | POA: Diagnosis not present

## 2013-07-19 DIAGNOSIS — N039 Chronic nephritic syndrome with unspecified morphologic changes: Secondary | ICD-10-CM | POA: Diagnosis not present

## 2013-07-19 DIAGNOSIS — N2581 Secondary hyperparathyroidism of renal origin: Secondary | ICD-10-CM | POA: Diagnosis not present

## 2013-07-21 DIAGNOSIS — N039 Chronic nephritic syndrome with unspecified morphologic changes: Secondary | ICD-10-CM | POA: Diagnosis not present

## 2013-07-21 DIAGNOSIS — D631 Anemia in chronic kidney disease: Secondary | ICD-10-CM | POA: Diagnosis not present

## 2013-07-21 DIAGNOSIS — N186 End stage renal disease: Secondary | ICD-10-CM | POA: Diagnosis not present

## 2013-07-21 DIAGNOSIS — N2581 Secondary hyperparathyroidism of renal origin: Secondary | ICD-10-CM | POA: Diagnosis not present

## 2013-07-24 DIAGNOSIS — D631 Anemia in chronic kidney disease: Secondary | ICD-10-CM | POA: Diagnosis not present

## 2013-07-24 DIAGNOSIS — N186 End stage renal disease: Secondary | ICD-10-CM | POA: Diagnosis not present

## 2013-07-24 DIAGNOSIS — N039 Chronic nephritic syndrome with unspecified morphologic changes: Secondary | ICD-10-CM | POA: Diagnosis not present

## 2013-07-24 DIAGNOSIS — N2581 Secondary hyperparathyroidism of renal origin: Secondary | ICD-10-CM | POA: Diagnosis not present

## 2013-07-26 DIAGNOSIS — D631 Anemia in chronic kidney disease: Secondary | ICD-10-CM | POA: Diagnosis not present

## 2013-07-26 DIAGNOSIS — N186 End stage renal disease: Secondary | ICD-10-CM | POA: Diagnosis not present

## 2013-07-26 DIAGNOSIS — N2581 Secondary hyperparathyroidism of renal origin: Secondary | ICD-10-CM | POA: Diagnosis not present

## 2013-07-26 DIAGNOSIS — N039 Chronic nephritic syndrome with unspecified morphologic changes: Secondary | ICD-10-CM | POA: Diagnosis not present

## 2013-07-28 DIAGNOSIS — D631 Anemia in chronic kidney disease: Secondary | ICD-10-CM | POA: Diagnosis not present

## 2013-07-28 DIAGNOSIS — N186 End stage renal disease: Secondary | ICD-10-CM | POA: Diagnosis not present

## 2013-07-28 DIAGNOSIS — N2581 Secondary hyperparathyroidism of renal origin: Secondary | ICD-10-CM | POA: Diagnosis not present

## 2013-07-31 DIAGNOSIS — N186 End stage renal disease: Secondary | ICD-10-CM | POA: Diagnosis not present

## 2013-07-31 DIAGNOSIS — D631 Anemia in chronic kidney disease: Secondary | ICD-10-CM | POA: Diagnosis not present

## 2013-07-31 DIAGNOSIS — N2581 Secondary hyperparathyroidism of renal origin: Secondary | ICD-10-CM | POA: Diagnosis not present

## 2013-08-01 DIAGNOSIS — N186 End stage renal disease: Secondary | ICD-10-CM | POA: Diagnosis not present

## 2013-08-02 DIAGNOSIS — N039 Chronic nephritic syndrome with unspecified morphologic changes: Secondary | ICD-10-CM | POA: Diagnosis not present

## 2013-08-02 DIAGNOSIS — D631 Anemia in chronic kidney disease: Secondary | ICD-10-CM | POA: Diagnosis not present

## 2013-08-02 DIAGNOSIS — N186 End stage renal disease: Secondary | ICD-10-CM | POA: Diagnosis not present

## 2013-08-02 DIAGNOSIS — N2581 Secondary hyperparathyroidism of renal origin: Secondary | ICD-10-CM | POA: Diagnosis not present

## 2013-08-04 DIAGNOSIS — D631 Anemia in chronic kidney disease: Secondary | ICD-10-CM | POA: Diagnosis not present

## 2013-08-04 DIAGNOSIS — N2581 Secondary hyperparathyroidism of renal origin: Secondary | ICD-10-CM | POA: Diagnosis not present

## 2013-08-04 DIAGNOSIS — N186 End stage renal disease: Secondary | ICD-10-CM | POA: Diagnosis not present

## 2013-08-07 DIAGNOSIS — D631 Anemia in chronic kidney disease: Secondary | ICD-10-CM | POA: Diagnosis not present

## 2013-08-07 DIAGNOSIS — N186 End stage renal disease: Secondary | ICD-10-CM | POA: Diagnosis not present

## 2013-08-07 DIAGNOSIS — N2581 Secondary hyperparathyroidism of renal origin: Secondary | ICD-10-CM | POA: Diagnosis not present

## 2013-08-09 DIAGNOSIS — N2581 Secondary hyperparathyroidism of renal origin: Secondary | ICD-10-CM | POA: Diagnosis not present

## 2013-08-09 DIAGNOSIS — N186 End stage renal disease: Secondary | ICD-10-CM | POA: Diagnosis not present

## 2013-08-09 DIAGNOSIS — D631 Anemia in chronic kidney disease: Secondary | ICD-10-CM | POA: Diagnosis not present

## 2013-08-11 DIAGNOSIS — N039 Chronic nephritic syndrome with unspecified morphologic changes: Secondary | ICD-10-CM | POA: Diagnosis not present

## 2013-08-11 DIAGNOSIS — N2581 Secondary hyperparathyroidism of renal origin: Secondary | ICD-10-CM | POA: Diagnosis not present

## 2013-08-11 DIAGNOSIS — N186 End stage renal disease: Secondary | ICD-10-CM | POA: Diagnosis not present

## 2013-08-11 DIAGNOSIS — D631 Anemia in chronic kidney disease: Secondary | ICD-10-CM | POA: Diagnosis not present

## 2013-08-14 DIAGNOSIS — N186 End stage renal disease: Secondary | ICD-10-CM | POA: Diagnosis not present

## 2013-08-14 DIAGNOSIS — N2581 Secondary hyperparathyroidism of renal origin: Secondary | ICD-10-CM | POA: Diagnosis not present

## 2013-08-14 DIAGNOSIS — D631 Anemia in chronic kidney disease: Secondary | ICD-10-CM | POA: Diagnosis not present

## 2013-08-16 DIAGNOSIS — N2581 Secondary hyperparathyroidism of renal origin: Secondary | ICD-10-CM | POA: Diagnosis not present

## 2013-08-16 DIAGNOSIS — D631 Anemia in chronic kidney disease: Secondary | ICD-10-CM | POA: Diagnosis not present

## 2013-08-16 DIAGNOSIS — N186 End stage renal disease: Secondary | ICD-10-CM | POA: Diagnosis not present

## 2013-08-18 DIAGNOSIS — N039 Chronic nephritic syndrome with unspecified morphologic changes: Secondary | ICD-10-CM | POA: Diagnosis not present

## 2013-08-18 DIAGNOSIS — N2581 Secondary hyperparathyroidism of renal origin: Secondary | ICD-10-CM | POA: Diagnosis not present

## 2013-08-18 DIAGNOSIS — D631 Anemia in chronic kidney disease: Secondary | ICD-10-CM | POA: Diagnosis not present

## 2013-08-18 DIAGNOSIS — N186 End stage renal disease: Secondary | ICD-10-CM | POA: Diagnosis not present

## 2013-08-21 DIAGNOSIS — D631 Anemia in chronic kidney disease: Secondary | ICD-10-CM | POA: Diagnosis not present

## 2013-08-21 DIAGNOSIS — N2581 Secondary hyperparathyroidism of renal origin: Secondary | ICD-10-CM | POA: Diagnosis not present

## 2013-08-21 DIAGNOSIS — N186 End stage renal disease: Secondary | ICD-10-CM | POA: Diagnosis not present

## 2013-08-25 DIAGNOSIS — N2581 Secondary hyperparathyroidism of renal origin: Secondary | ICD-10-CM | POA: Diagnosis not present

## 2013-08-25 DIAGNOSIS — D631 Anemia in chronic kidney disease: Secondary | ICD-10-CM | POA: Diagnosis not present

## 2013-08-25 DIAGNOSIS — N186 End stage renal disease: Secondary | ICD-10-CM | POA: Diagnosis not present

## 2013-08-28 DIAGNOSIS — D631 Anemia in chronic kidney disease: Secondary | ICD-10-CM | POA: Diagnosis not present

## 2013-08-28 DIAGNOSIS — N186 End stage renal disease: Secondary | ICD-10-CM | POA: Diagnosis not present

## 2013-08-28 DIAGNOSIS — N2581 Secondary hyperparathyroidism of renal origin: Secondary | ICD-10-CM | POA: Diagnosis not present

## 2013-08-30 DIAGNOSIS — N186 End stage renal disease: Secondary | ICD-10-CM | POA: Diagnosis not present

## 2013-08-30 DIAGNOSIS — N2581 Secondary hyperparathyroidism of renal origin: Secondary | ICD-10-CM | POA: Diagnosis not present

## 2013-08-30 DIAGNOSIS — D631 Anemia in chronic kidney disease: Secondary | ICD-10-CM | POA: Diagnosis not present

## 2013-09-01 DIAGNOSIS — N2581 Secondary hyperparathyroidism of renal origin: Secondary | ICD-10-CM | POA: Diagnosis not present

## 2013-09-01 DIAGNOSIS — N186 End stage renal disease: Secondary | ICD-10-CM | POA: Diagnosis not present

## 2013-09-01 DIAGNOSIS — N039 Chronic nephritic syndrome with unspecified morphologic changes: Secondary | ICD-10-CM | POA: Diagnosis not present

## 2013-09-01 DIAGNOSIS — D631 Anemia in chronic kidney disease: Secondary | ICD-10-CM | POA: Diagnosis not present

## 2013-09-04 DIAGNOSIS — N186 End stage renal disease: Secondary | ICD-10-CM | POA: Diagnosis not present

## 2013-09-04 DIAGNOSIS — N2581 Secondary hyperparathyroidism of renal origin: Secondary | ICD-10-CM | POA: Diagnosis not present

## 2013-09-04 DIAGNOSIS — D631 Anemia in chronic kidney disease: Secondary | ICD-10-CM | POA: Diagnosis not present

## 2013-09-06 DIAGNOSIS — N186 End stage renal disease: Secondary | ICD-10-CM | POA: Diagnosis not present

## 2013-09-06 DIAGNOSIS — N2581 Secondary hyperparathyroidism of renal origin: Secondary | ICD-10-CM | POA: Diagnosis not present

## 2013-09-06 DIAGNOSIS — D631 Anemia in chronic kidney disease: Secondary | ICD-10-CM | POA: Diagnosis not present

## 2013-09-08 DIAGNOSIS — N186 End stage renal disease: Secondary | ICD-10-CM | POA: Diagnosis not present

## 2013-09-08 DIAGNOSIS — N2581 Secondary hyperparathyroidism of renal origin: Secondary | ICD-10-CM | POA: Diagnosis not present

## 2013-09-08 DIAGNOSIS — D631 Anemia in chronic kidney disease: Secondary | ICD-10-CM | POA: Diagnosis not present

## 2013-09-11 DIAGNOSIS — D631 Anemia in chronic kidney disease: Secondary | ICD-10-CM | POA: Diagnosis not present

## 2013-09-11 DIAGNOSIS — N2581 Secondary hyperparathyroidism of renal origin: Secondary | ICD-10-CM | POA: Diagnosis not present

## 2013-09-11 DIAGNOSIS — N186 End stage renal disease: Secondary | ICD-10-CM | POA: Diagnosis not present

## 2013-09-13 DIAGNOSIS — N2581 Secondary hyperparathyroidism of renal origin: Secondary | ICD-10-CM | POA: Diagnosis not present

## 2013-09-13 DIAGNOSIS — N186 End stage renal disease: Secondary | ICD-10-CM | POA: Diagnosis not present

## 2013-09-13 DIAGNOSIS — D631 Anemia in chronic kidney disease: Secondary | ICD-10-CM | POA: Diagnosis not present

## 2013-09-15 DIAGNOSIS — N186 End stage renal disease: Secondary | ICD-10-CM | POA: Diagnosis not present

## 2013-09-15 DIAGNOSIS — D631 Anemia in chronic kidney disease: Secondary | ICD-10-CM | POA: Diagnosis not present

## 2013-09-15 DIAGNOSIS — N2581 Secondary hyperparathyroidism of renal origin: Secondary | ICD-10-CM | POA: Diagnosis not present

## 2013-09-18 DIAGNOSIS — D631 Anemia in chronic kidney disease: Secondary | ICD-10-CM | POA: Diagnosis not present

## 2013-09-18 DIAGNOSIS — N186 End stage renal disease: Secondary | ICD-10-CM | POA: Diagnosis not present

## 2013-09-18 DIAGNOSIS — N2581 Secondary hyperparathyroidism of renal origin: Secondary | ICD-10-CM | POA: Diagnosis not present

## 2013-09-20 DIAGNOSIS — N2581 Secondary hyperparathyroidism of renal origin: Secondary | ICD-10-CM | POA: Diagnosis not present

## 2013-09-20 DIAGNOSIS — N186 End stage renal disease: Secondary | ICD-10-CM | POA: Diagnosis not present

## 2013-09-20 DIAGNOSIS — D631 Anemia in chronic kidney disease: Secondary | ICD-10-CM | POA: Diagnosis not present

## 2013-09-21 DIAGNOSIS — N2581 Secondary hyperparathyroidism of renal origin: Secondary | ICD-10-CM | POA: Diagnosis not present

## 2013-09-21 DIAGNOSIS — N186 End stage renal disease: Secondary | ICD-10-CM | POA: Diagnosis not present

## 2013-09-21 DIAGNOSIS — N039 Chronic nephritic syndrome with unspecified morphologic changes: Secondary | ICD-10-CM | POA: Diagnosis not present

## 2013-09-21 DIAGNOSIS — D631 Anemia in chronic kidney disease: Secondary | ICD-10-CM | POA: Diagnosis not present

## 2013-09-22 DIAGNOSIS — D631 Anemia in chronic kidney disease: Secondary | ICD-10-CM | POA: Diagnosis not present

## 2013-09-22 DIAGNOSIS — N186 End stage renal disease: Secondary | ICD-10-CM | POA: Diagnosis not present

## 2013-09-22 DIAGNOSIS — N2581 Secondary hyperparathyroidism of renal origin: Secondary | ICD-10-CM | POA: Diagnosis not present

## 2013-09-25 DIAGNOSIS — N186 End stage renal disease: Secondary | ICD-10-CM | POA: Diagnosis not present

## 2013-09-25 DIAGNOSIS — N039 Chronic nephritic syndrome with unspecified morphologic changes: Secondary | ICD-10-CM | POA: Diagnosis not present

## 2013-09-25 DIAGNOSIS — D631 Anemia in chronic kidney disease: Secondary | ICD-10-CM | POA: Diagnosis not present

## 2013-09-25 DIAGNOSIS — N2581 Secondary hyperparathyroidism of renal origin: Secondary | ICD-10-CM | POA: Diagnosis not present

## 2013-09-27 DIAGNOSIS — N186 End stage renal disease: Secondary | ICD-10-CM | POA: Diagnosis not present

## 2013-09-27 DIAGNOSIS — N2581 Secondary hyperparathyroidism of renal origin: Secondary | ICD-10-CM | POA: Diagnosis not present

## 2013-09-27 DIAGNOSIS — D631 Anemia in chronic kidney disease: Secondary | ICD-10-CM | POA: Diagnosis not present

## 2013-09-29 DIAGNOSIS — N039 Chronic nephritic syndrome with unspecified morphologic changes: Secondary | ICD-10-CM | POA: Diagnosis not present

## 2013-09-29 DIAGNOSIS — N186 End stage renal disease: Secondary | ICD-10-CM | POA: Diagnosis not present

## 2013-09-29 DIAGNOSIS — N2581 Secondary hyperparathyroidism of renal origin: Secondary | ICD-10-CM | POA: Diagnosis not present

## 2013-09-29 DIAGNOSIS — D631 Anemia in chronic kidney disease: Secondary | ICD-10-CM | POA: Diagnosis not present

## 2013-10-02 DIAGNOSIS — N186 End stage renal disease: Secondary | ICD-10-CM | POA: Diagnosis not present

## 2013-10-02 DIAGNOSIS — N2581 Secondary hyperparathyroidism of renal origin: Secondary | ICD-10-CM | POA: Diagnosis not present

## 2013-10-02 DIAGNOSIS — D631 Anemia in chronic kidney disease: Secondary | ICD-10-CM | POA: Diagnosis not present

## 2013-10-04 DIAGNOSIS — D631 Anemia in chronic kidney disease: Secondary | ICD-10-CM | POA: Diagnosis not present

## 2013-10-04 DIAGNOSIS — N186 End stage renal disease: Secondary | ICD-10-CM | POA: Diagnosis not present

## 2013-10-04 DIAGNOSIS — N2581 Secondary hyperparathyroidism of renal origin: Secondary | ICD-10-CM | POA: Diagnosis not present

## 2013-10-06 DIAGNOSIS — N186 End stage renal disease: Secondary | ICD-10-CM | POA: Diagnosis not present

## 2013-10-06 DIAGNOSIS — N2581 Secondary hyperparathyroidism of renal origin: Secondary | ICD-10-CM | POA: Diagnosis not present

## 2013-10-06 DIAGNOSIS — D631 Anemia in chronic kidney disease: Secondary | ICD-10-CM | POA: Diagnosis not present

## 2013-10-09 DIAGNOSIS — D631 Anemia in chronic kidney disease: Secondary | ICD-10-CM | POA: Diagnosis not present

## 2013-10-09 DIAGNOSIS — N039 Chronic nephritic syndrome with unspecified morphologic changes: Secondary | ICD-10-CM | POA: Diagnosis not present

## 2013-10-09 DIAGNOSIS — N2581 Secondary hyperparathyroidism of renal origin: Secondary | ICD-10-CM | POA: Diagnosis not present

## 2013-10-09 DIAGNOSIS — N186 End stage renal disease: Secondary | ICD-10-CM | POA: Diagnosis not present

## 2013-10-11 DIAGNOSIS — N2581 Secondary hyperparathyroidism of renal origin: Secondary | ICD-10-CM | POA: Diagnosis not present

## 2013-10-11 DIAGNOSIS — N186 End stage renal disease: Secondary | ICD-10-CM | POA: Diagnosis not present

## 2013-10-11 DIAGNOSIS — D631 Anemia in chronic kidney disease: Secondary | ICD-10-CM | POA: Diagnosis not present

## 2013-10-13 DIAGNOSIS — D631 Anemia in chronic kidney disease: Secondary | ICD-10-CM | POA: Diagnosis not present

## 2013-10-13 DIAGNOSIS — N2581 Secondary hyperparathyroidism of renal origin: Secondary | ICD-10-CM | POA: Diagnosis not present

## 2013-10-13 DIAGNOSIS — N186 End stage renal disease: Secondary | ICD-10-CM | POA: Diagnosis not present

## 2013-10-16 DIAGNOSIS — N186 End stage renal disease: Secondary | ICD-10-CM | POA: Diagnosis not present

## 2013-10-16 DIAGNOSIS — D631 Anemia in chronic kidney disease: Secondary | ICD-10-CM | POA: Diagnosis not present

## 2013-10-16 DIAGNOSIS — N2581 Secondary hyperparathyroidism of renal origin: Secondary | ICD-10-CM | POA: Diagnosis not present

## 2013-10-18 DIAGNOSIS — N2581 Secondary hyperparathyroidism of renal origin: Secondary | ICD-10-CM | POA: Diagnosis not present

## 2013-10-18 DIAGNOSIS — D631 Anemia in chronic kidney disease: Secondary | ICD-10-CM | POA: Diagnosis not present

## 2013-10-18 DIAGNOSIS — N186 End stage renal disease: Secondary | ICD-10-CM | POA: Diagnosis not present

## 2013-10-18 DIAGNOSIS — N039 Chronic nephritic syndrome with unspecified morphologic changes: Secondary | ICD-10-CM | POA: Diagnosis not present

## 2013-10-20 DIAGNOSIS — D631 Anemia in chronic kidney disease: Secondary | ICD-10-CM | POA: Diagnosis not present

## 2013-10-20 DIAGNOSIS — N2581 Secondary hyperparathyroidism of renal origin: Secondary | ICD-10-CM | POA: Diagnosis not present

## 2013-10-20 DIAGNOSIS — N186 End stage renal disease: Secondary | ICD-10-CM | POA: Diagnosis not present

## 2013-10-23 DIAGNOSIS — D631 Anemia in chronic kidney disease: Secondary | ICD-10-CM | POA: Diagnosis not present

## 2013-10-23 DIAGNOSIS — N039 Chronic nephritic syndrome with unspecified morphologic changes: Secondary | ICD-10-CM | POA: Diagnosis not present

## 2013-10-23 DIAGNOSIS — N2581 Secondary hyperparathyroidism of renal origin: Secondary | ICD-10-CM | POA: Diagnosis not present

## 2013-10-23 DIAGNOSIS — N186 End stage renal disease: Secondary | ICD-10-CM | POA: Diagnosis not present

## 2013-10-25 DIAGNOSIS — N2581 Secondary hyperparathyroidism of renal origin: Secondary | ICD-10-CM | POA: Diagnosis not present

## 2013-10-25 DIAGNOSIS — N186 End stage renal disease: Secondary | ICD-10-CM | POA: Diagnosis not present

## 2013-10-25 DIAGNOSIS — D631 Anemia in chronic kidney disease: Secondary | ICD-10-CM | POA: Diagnosis not present

## 2013-10-27 DIAGNOSIS — N186 End stage renal disease: Secondary | ICD-10-CM | POA: Diagnosis not present

## 2013-10-27 DIAGNOSIS — D631 Anemia in chronic kidney disease: Secondary | ICD-10-CM | POA: Diagnosis not present

## 2013-10-27 DIAGNOSIS — N2581 Secondary hyperparathyroidism of renal origin: Secondary | ICD-10-CM | POA: Diagnosis not present

## 2013-10-27 DIAGNOSIS — N039 Chronic nephritic syndrome with unspecified morphologic changes: Secondary | ICD-10-CM | POA: Diagnosis not present

## 2013-10-30 DIAGNOSIS — N186 End stage renal disease: Secondary | ICD-10-CM | POA: Diagnosis not present

## 2013-10-30 DIAGNOSIS — N2581 Secondary hyperparathyroidism of renal origin: Secondary | ICD-10-CM | POA: Diagnosis not present

## 2013-10-30 DIAGNOSIS — D631 Anemia in chronic kidney disease: Secondary | ICD-10-CM | POA: Diagnosis not present

## 2013-11-01 DIAGNOSIS — N186 End stage renal disease: Secondary | ICD-10-CM | POA: Diagnosis not present

## 2013-11-01 DIAGNOSIS — N2581 Secondary hyperparathyroidism of renal origin: Secondary | ICD-10-CM | POA: Diagnosis not present

## 2013-11-01 DIAGNOSIS — D631 Anemia in chronic kidney disease: Secondary | ICD-10-CM | POA: Diagnosis not present

## 2013-11-03 DIAGNOSIS — N2581 Secondary hyperparathyroidism of renal origin: Secondary | ICD-10-CM | POA: Diagnosis not present

## 2013-11-03 DIAGNOSIS — D631 Anemia in chronic kidney disease: Secondary | ICD-10-CM | POA: Diagnosis not present

## 2013-11-03 DIAGNOSIS — N186 End stage renal disease: Secondary | ICD-10-CM | POA: Diagnosis not present

## 2013-11-06 DIAGNOSIS — N2581 Secondary hyperparathyroidism of renal origin: Secondary | ICD-10-CM | POA: Diagnosis not present

## 2013-11-06 DIAGNOSIS — N186 End stage renal disease: Secondary | ICD-10-CM | POA: Diagnosis not present

## 2013-11-06 DIAGNOSIS — D631 Anemia in chronic kidney disease: Secondary | ICD-10-CM | POA: Diagnosis not present

## 2013-11-10 DIAGNOSIS — D631 Anemia in chronic kidney disease: Secondary | ICD-10-CM | POA: Diagnosis not present

## 2013-11-10 DIAGNOSIS — N2581 Secondary hyperparathyroidism of renal origin: Secondary | ICD-10-CM | POA: Diagnosis not present

## 2013-11-10 DIAGNOSIS — N186 End stage renal disease: Secondary | ICD-10-CM | POA: Diagnosis not present

## 2013-11-13 DIAGNOSIS — D631 Anemia in chronic kidney disease: Secondary | ICD-10-CM | POA: Diagnosis not present

## 2013-11-13 DIAGNOSIS — N2581 Secondary hyperparathyroidism of renal origin: Secondary | ICD-10-CM | POA: Diagnosis not present

## 2013-11-13 DIAGNOSIS — N186 End stage renal disease: Secondary | ICD-10-CM | POA: Diagnosis not present

## 2013-11-15 DIAGNOSIS — D631 Anemia in chronic kidney disease: Secondary | ICD-10-CM | POA: Diagnosis not present

## 2013-11-15 DIAGNOSIS — N186 End stage renal disease: Secondary | ICD-10-CM | POA: Diagnosis not present

## 2013-11-15 DIAGNOSIS — N2581 Secondary hyperparathyroidism of renal origin: Secondary | ICD-10-CM | POA: Diagnosis not present

## 2013-11-17 DIAGNOSIS — N186 End stage renal disease: Secondary | ICD-10-CM | POA: Diagnosis not present

## 2013-11-17 DIAGNOSIS — D631 Anemia in chronic kidney disease: Secondary | ICD-10-CM | POA: Diagnosis not present

## 2013-11-17 DIAGNOSIS — N2581 Secondary hyperparathyroidism of renal origin: Secondary | ICD-10-CM | POA: Diagnosis not present

## 2013-11-20 DIAGNOSIS — N186 End stage renal disease: Secondary | ICD-10-CM | POA: Diagnosis not present

## 2013-11-20 DIAGNOSIS — N2581 Secondary hyperparathyroidism of renal origin: Secondary | ICD-10-CM | POA: Diagnosis not present

## 2013-11-20 DIAGNOSIS — D631 Anemia in chronic kidney disease: Secondary | ICD-10-CM | POA: Diagnosis not present

## 2013-11-22 DIAGNOSIS — N186 End stage renal disease: Secondary | ICD-10-CM | POA: Diagnosis not present

## 2013-11-22 DIAGNOSIS — N2581 Secondary hyperparathyroidism of renal origin: Secondary | ICD-10-CM | POA: Diagnosis not present

## 2013-11-22 DIAGNOSIS — D631 Anemia in chronic kidney disease: Secondary | ICD-10-CM | POA: Diagnosis not present

## 2013-11-24 DIAGNOSIS — N186 End stage renal disease: Secondary | ICD-10-CM | POA: Diagnosis not present

## 2013-11-24 DIAGNOSIS — N2581 Secondary hyperparathyroidism of renal origin: Secondary | ICD-10-CM | POA: Diagnosis not present

## 2013-11-24 DIAGNOSIS — D631 Anemia in chronic kidney disease: Secondary | ICD-10-CM | POA: Diagnosis not present

## 2013-11-27 DIAGNOSIS — D631 Anemia in chronic kidney disease: Secondary | ICD-10-CM | POA: Diagnosis not present

## 2013-11-27 DIAGNOSIS — N186 End stage renal disease: Secondary | ICD-10-CM | POA: Diagnosis not present

## 2013-11-27 DIAGNOSIS — N2581 Secondary hyperparathyroidism of renal origin: Secondary | ICD-10-CM | POA: Diagnosis not present

## 2013-11-29 DIAGNOSIS — N186 End stage renal disease: Secondary | ICD-10-CM | POA: Diagnosis not present

## 2013-11-29 DIAGNOSIS — N2581 Secondary hyperparathyroidism of renal origin: Secondary | ICD-10-CM | POA: Diagnosis not present

## 2013-11-29 DIAGNOSIS — D631 Anemia in chronic kidney disease: Secondary | ICD-10-CM | POA: Diagnosis not present

## 2013-12-01 DIAGNOSIS — N186 End stage renal disease: Secondary | ICD-10-CM | POA: Diagnosis not present

## 2013-12-01 DIAGNOSIS — N2581 Secondary hyperparathyroidism of renal origin: Secondary | ICD-10-CM | POA: Diagnosis not present

## 2013-12-01 DIAGNOSIS — D631 Anemia in chronic kidney disease: Secondary | ICD-10-CM | POA: Diagnosis not present

## 2013-12-02 DIAGNOSIS — Z992 Dependence on renal dialysis: Secondary | ICD-10-CM | POA: Diagnosis not present

## 2013-12-02 DIAGNOSIS — N186 End stage renal disease: Secondary | ICD-10-CM | POA: Diagnosis not present

## 2013-12-04 DIAGNOSIS — L299 Pruritus, unspecified: Secondary | ICD-10-CM | POA: Diagnosis not present

## 2013-12-04 DIAGNOSIS — D631 Anemia in chronic kidney disease: Secondary | ICD-10-CM | POA: Diagnosis not present

## 2013-12-04 DIAGNOSIS — N2581 Secondary hyperparathyroidism of renal origin: Secondary | ICD-10-CM | POA: Diagnosis not present

## 2013-12-04 DIAGNOSIS — N186 End stage renal disease: Secondary | ICD-10-CM | POA: Diagnosis not present

## 2013-12-06 DIAGNOSIS — D631 Anemia in chronic kidney disease: Secondary | ICD-10-CM | POA: Diagnosis not present

## 2013-12-06 DIAGNOSIS — L299 Pruritus, unspecified: Secondary | ICD-10-CM | POA: Diagnosis not present

## 2013-12-06 DIAGNOSIS — N2581 Secondary hyperparathyroidism of renal origin: Secondary | ICD-10-CM | POA: Diagnosis not present

## 2013-12-06 DIAGNOSIS — N186 End stage renal disease: Secondary | ICD-10-CM | POA: Diagnosis not present

## 2013-12-08 DIAGNOSIS — D631 Anemia in chronic kidney disease: Secondary | ICD-10-CM | POA: Diagnosis not present

## 2013-12-08 DIAGNOSIS — N186 End stage renal disease: Secondary | ICD-10-CM | POA: Diagnosis not present

## 2013-12-08 DIAGNOSIS — L299 Pruritus, unspecified: Secondary | ICD-10-CM | POA: Diagnosis not present

## 2013-12-08 DIAGNOSIS — N2581 Secondary hyperparathyroidism of renal origin: Secondary | ICD-10-CM | POA: Diagnosis not present

## 2013-12-11 DIAGNOSIS — D631 Anemia in chronic kidney disease: Secondary | ICD-10-CM | POA: Diagnosis not present

## 2013-12-11 DIAGNOSIS — L299 Pruritus, unspecified: Secondary | ICD-10-CM | POA: Diagnosis not present

## 2013-12-11 DIAGNOSIS — N186 End stage renal disease: Secondary | ICD-10-CM | POA: Diagnosis not present

## 2013-12-11 DIAGNOSIS — N2581 Secondary hyperparathyroidism of renal origin: Secondary | ICD-10-CM | POA: Diagnosis not present

## 2013-12-14 DIAGNOSIS — N250 Renal osteodystrophy: Secondary | ICD-10-CM | POA: Diagnosis not present

## 2013-12-14 DIAGNOSIS — N2581 Secondary hyperparathyroidism of renal origin: Secondary | ICD-10-CM | POA: Diagnosis not present

## 2013-12-14 DIAGNOSIS — R05 Cough: Secondary | ICD-10-CM | POA: Diagnosis not present

## 2013-12-14 DIAGNOSIS — J441 Chronic obstructive pulmonary disease with (acute) exacerbation: Secondary | ICD-10-CM | POA: Diagnosis not present

## 2013-12-15 DIAGNOSIS — D631 Anemia in chronic kidney disease: Secondary | ICD-10-CM | POA: Diagnosis not present

## 2013-12-15 DIAGNOSIS — N186 End stage renal disease: Secondary | ICD-10-CM | POA: Diagnosis not present

## 2013-12-15 DIAGNOSIS — N2581 Secondary hyperparathyroidism of renal origin: Secondary | ICD-10-CM | POA: Diagnosis not present

## 2013-12-15 DIAGNOSIS — L299 Pruritus, unspecified: Secondary | ICD-10-CM | POA: Diagnosis not present

## 2013-12-18 DIAGNOSIS — L299 Pruritus, unspecified: Secondary | ICD-10-CM | POA: Diagnosis not present

## 2013-12-18 DIAGNOSIS — N186 End stage renal disease: Secondary | ICD-10-CM | POA: Diagnosis not present

## 2013-12-18 DIAGNOSIS — D631 Anemia in chronic kidney disease: Secondary | ICD-10-CM | POA: Diagnosis not present

## 2013-12-18 DIAGNOSIS — N2581 Secondary hyperparathyroidism of renal origin: Secondary | ICD-10-CM | POA: Diagnosis not present

## 2013-12-20 DIAGNOSIS — N2581 Secondary hyperparathyroidism of renal origin: Secondary | ICD-10-CM | POA: Diagnosis not present

## 2013-12-20 DIAGNOSIS — D631 Anemia in chronic kidney disease: Secondary | ICD-10-CM | POA: Diagnosis not present

## 2013-12-20 DIAGNOSIS — L299 Pruritus, unspecified: Secondary | ICD-10-CM | POA: Diagnosis not present

## 2013-12-20 DIAGNOSIS — N186 End stage renal disease: Secondary | ICD-10-CM | POA: Diagnosis not present

## 2013-12-22 DIAGNOSIS — L299 Pruritus, unspecified: Secondary | ICD-10-CM | POA: Diagnosis not present

## 2013-12-22 DIAGNOSIS — D631 Anemia in chronic kidney disease: Secondary | ICD-10-CM | POA: Diagnosis not present

## 2013-12-22 DIAGNOSIS — N186 End stage renal disease: Secondary | ICD-10-CM | POA: Diagnosis not present

## 2013-12-22 DIAGNOSIS — N2581 Secondary hyperparathyroidism of renal origin: Secondary | ICD-10-CM | POA: Diagnosis not present

## 2013-12-25 DIAGNOSIS — D631 Anemia in chronic kidney disease: Secondary | ICD-10-CM | POA: Diagnosis not present

## 2013-12-25 DIAGNOSIS — N186 End stage renal disease: Secondary | ICD-10-CM | POA: Diagnosis not present

## 2013-12-25 DIAGNOSIS — L299 Pruritus, unspecified: Secondary | ICD-10-CM | POA: Diagnosis not present

## 2013-12-25 DIAGNOSIS — N2581 Secondary hyperparathyroidism of renal origin: Secondary | ICD-10-CM | POA: Diagnosis not present

## 2013-12-27 DIAGNOSIS — D631 Anemia in chronic kidney disease: Secondary | ICD-10-CM | POA: Diagnosis not present

## 2013-12-27 DIAGNOSIS — L299 Pruritus, unspecified: Secondary | ICD-10-CM | POA: Diagnosis not present

## 2013-12-27 DIAGNOSIS — N2581 Secondary hyperparathyroidism of renal origin: Secondary | ICD-10-CM | POA: Diagnosis not present

## 2013-12-27 DIAGNOSIS — N186 End stage renal disease: Secondary | ICD-10-CM | POA: Diagnosis not present

## 2013-12-30 DIAGNOSIS — N2581 Secondary hyperparathyroidism of renal origin: Secondary | ICD-10-CM | POA: Diagnosis not present

## 2013-12-30 DIAGNOSIS — N186 End stage renal disease: Secondary | ICD-10-CM | POA: Diagnosis not present

## 2013-12-30 DIAGNOSIS — D631 Anemia in chronic kidney disease: Secondary | ICD-10-CM | POA: Diagnosis not present

## 2013-12-30 DIAGNOSIS — L299 Pruritus, unspecified: Secondary | ICD-10-CM | POA: Diagnosis not present

## 2014-01-01 DIAGNOSIS — D631 Anemia in chronic kidney disease: Secondary | ICD-10-CM | POA: Diagnosis not present

## 2014-01-01 DIAGNOSIS — N2581 Secondary hyperparathyroidism of renal origin: Secondary | ICD-10-CM | POA: Diagnosis not present

## 2014-01-01 DIAGNOSIS — L299 Pruritus, unspecified: Secondary | ICD-10-CM | POA: Diagnosis not present

## 2014-01-01 DIAGNOSIS — N186 End stage renal disease: Secondary | ICD-10-CM | POA: Diagnosis not present

## 2014-01-01 DIAGNOSIS — Z992 Dependence on renal dialysis: Secondary | ICD-10-CM | POA: Diagnosis not present

## 2014-01-05 DIAGNOSIS — N186 End stage renal disease: Secondary | ICD-10-CM | POA: Diagnosis not present

## 2014-01-05 DIAGNOSIS — D631 Anemia in chronic kidney disease: Secondary | ICD-10-CM | POA: Diagnosis not present

## 2014-01-05 DIAGNOSIS — N2581 Secondary hyperparathyroidism of renal origin: Secondary | ICD-10-CM | POA: Diagnosis not present

## 2014-01-08 DIAGNOSIS — D631 Anemia in chronic kidney disease: Secondary | ICD-10-CM | POA: Diagnosis not present

## 2014-01-08 DIAGNOSIS — N186 End stage renal disease: Secondary | ICD-10-CM | POA: Diagnosis not present

## 2014-01-08 DIAGNOSIS — N2581 Secondary hyperparathyroidism of renal origin: Secondary | ICD-10-CM | POA: Diagnosis not present

## 2014-01-10 DIAGNOSIS — D631 Anemia in chronic kidney disease: Secondary | ICD-10-CM | POA: Diagnosis not present

## 2014-01-10 DIAGNOSIS — N186 End stage renal disease: Secondary | ICD-10-CM | POA: Diagnosis not present

## 2014-01-10 DIAGNOSIS — N2581 Secondary hyperparathyroidism of renal origin: Secondary | ICD-10-CM | POA: Diagnosis not present

## 2014-01-13 DIAGNOSIS — N186 End stage renal disease: Secondary | ICD-10-CM | POA: Diagnosis not present

## 2014-01-13 DIAGNOSIS — D631 Anemia in chronic kidney disease: Secondary | ICD-10-CM | POA: Diagnosis not present

## 2014-01-13 DIAGNOSIS — N2581 Secondary hyperparathyroidism of renal origin: Secondary | ICD-10-CM | POA: Diagnosis not present

## 2014-01-15 DIAGNOSIS — D631 Anemia in chronic kidney disease: Secondary | ICD-10-CM | POA: Diagnosis not present

## 2014-01-15 DIAGNOSIS — N2581 Secondary hyperparathyroidism of renal origin: Secondary | ICD-10-CM | POA: Diagnosis not present

## 2014-01-15 DIAGNOSIS — N186 End stage renal disease: Secondary | ICD-10-CM | POA: Diagnosis not present

## 2014-01-17 DIAGNOSIS — D631 Anemia in chronic kidney disease: Secondary | ICD-10-CM | POA: Diagnosis not present

## 2014-01-17 DIAGNOSIS — N2581 Secondary hyperparathyroidism of renal origin: Secondary | ICD-10-CM | POA: Diagnosis not present

## 2014-01-17 DIAGNOSIS — N186 End stage renal disease: Secondary | ICD-10-CM | POA: Diagnosis not present

## 2014-01-19 DIAGNOSIS — N186 End stage renal disease: Secondary | ICD-10-CM | POA: Diagnosis not present

## 2014-01-19 DIAGNOSIS — D631 Anemia in chronic kidney disease: Secondary | ICD-10-CM | POA: Diagnosis not present

## 2014-01-19 DIAGNOSIS — N2581 Secondary hyperparathyroidism of renal origin: Secondary | ICD-10-CM | POA: Diagnosis not present

## 2014-01-22 DIAGNOSIS — D631 Anemia in chronic kidney disease: Secondary | ICD-10-CM | POA: Diagnosis not present

## 2014-01-22 DIAGNOSIS — N2581 Secondary hyperparathyroidism of renal origin: Secondary | ICD-10-CM | POA: Diagnosis not present

## 2014-01-22 DIAGNOSIS — N186 End stage renal disease: Secondary | ICD-10-CM | POA: Diagnosis not present

## 2014-01-24 DIAGNOSIS — D631 Anemia in chronic kidney disease: Secondary | ICD-10-CM | POA: Diagnosis not present

## 2014-01-24 DIAGNOSIS — N186 End stage renal disease: Secondary | ICD-10-CM | POA: Diagnosis not present

## 2014-01-24 DIAGNOSIS — N2581 Secondary hyperparathyroidism of renal origin: Secondary | ICD-10-CM | POA: Diagnosis not present

## 2014-01-27 DIAGNOSIS — N186 End stage renal disease: Secondary | ICD-10-CM | POA: Diagnosis not present

## 2014-01-27 DIAGNOSIS — D631 Anemia in chronic kidney disease: Secondary | ICD-10-CM | POA: Diagnosis not present

## 2014-01-27 DIAGNOSIS — N2581 Secondary hyperparathyroidism of renal origin: Secondary | ICD-10-CM | POA: Diagnosis not present

## 2014-01-29 DIAGNOSIS — N186 End stage renal disease: Secondary | ICD-10-CM | POA: Diagnosis not present

## 2014-01-29 DIAGNOSIS — N2581 Secondary hyperparathyroidism of renal origin: Secondary | ICD-10-CM | POA: Diagnosis not present

## 2014-01-29 DIAGNOSIS — D631 Anemia in chronic kidney disease: Secondary | ICD-10-CM | POA: Diagnosis not present

## 2014-01-31 ENCOUNTER — Ambulatory Visit: Payer: Self-pay | Admitting: Family Medicine

## 2014-01-31 DIAGNOSIS — M25511 Pain in right shoulder: Secondary | ICD-10-CM | POA: Diagnosis not present

## 2014-01-31 DIAGNOSIS — M898X1 Other specified disorders of bone, shoulder: Secondary | ICD-10-CM | POA: Diagnosis not present

## 2014-01-31 DIAGNOSIS — Z940 Kidney transplant status: Secondary | ICD-10-CM | POA: Diagnosis not present

## 2014-01-31 DIAGNOSIS — F17200 Nicotine dependence, unspecified, uncomplicated: Secondary | ICD-10-CM | POA: Diagnosis not present

## 2014-01-31 DIAGNOSIS — M19011 Primary osteoarthritis, right shoulder: Secondary | ICD-10-CM | POA: Diagnosis not present

## 2014-01-31 DIAGNOSIS — Z992 Dependence on renal dialysis: Secondary | ICD-10-CM | POA: Diagnosis not present

## 2014-02-01 DIAGNOSIS — Z992 Dependence on renal dialysis: Secondary | ICD-10-CM | POA: Diagnosis not present

## 2014-02-01 DIAGNOSIS — D631 Anemia in chronic kidney disease: Secondary | ICD-10-CM | POA: Diagnosis not present

## 2014-02-01 DIAGNOSIS — N2581 Secondary hyperparathyroidism of renal origin: Secondary | ICD-10-CM | POA: Diagnosis not present

## 2014-02-01 DIAGNOSIS — N186 End stage renal disease: Secondary | ICD-10-CM | POA: Diagnosis not present

## 2014-02-03 DIAGNOSIS — N2581 Secondary hyperparathyroidism of renal origin: Secondary | ICD-10-CM | POA: Diagnosis not present

## 2014-02-03 DIAGNOSIS — D631 Anemia in chronic kidney disease: Secondary | ICD-10-CM | POA: Diagnosis not present

## 2014-02-03 DIAGNOSIS — N186 End stage renal disease: Secondary | ICD-10-CM | POA: Diagnosis not present

## 2014-02-05 DIAGNOSIS — N2581 Secondary hyperparathyroidism of renal origin: Secondary | ICD-10-CM | POA: Diagnosis not present

## 2014-02-05 DIAGNOSIS — D631 Anemia in chronic kidney disease: Secondary | ICD-10-CM | POA: Diagnosis not present

## 2014-02-05 DIAGNOSIS — N186 End stage renal disease: Secondary | ICD-10-CM | POA: Diagnosis not present

## 2014-02-07 DIAGNOSIS — D631 Anemia in chronic kidney disease: Secondary | ICD-10-CM | POA: Diagnosis not present

## 2014-02-07 DIAGNOSIS — N2581 Secondary hyperparathyroidism of renal origin: Secondary | ICD-10-CM | POA: Diagnosis not present

## 2014-02-07 DIAGNOSIS — N186 End stage renal disease: Secondary | ICD-10-CM | POA: Diagnosis not present

## 2014-02-09 DIAGNOSIS — N2581 Secondary hyperparathyroidism of renal origin: Secondary | ICD-10-CM | POA: Diagnosis not present

## 2014-02-09 DIAGNOSIS — D631 Anemia in chronic kidney disease: Secondary | ICD-10-CM | POA: Diagnosis not present

## 2014-02-09 DIAGNOSIS — N186 End stage renal disease: Secondary | ICD-10-CM | POA: Diagnosis not present

## 2014-02-12 DIAGNOSIS — N2581 Secondary hyperparathyroidism of renal origin: Secondary | ICD-10-CM | POA: Diagnosis not present

## 2014-02-12 DIAGNOSIS — N186 End stage renal disease: Secondary | ICD-10-CM | POA: Diagnosis not present

## 2014-02-12 DIAGNOSIS — D631 Anemia in chronic kidney disease: Secondary | ICD-10-CM | POA: Diagnosis not present

## 2014-02-14 DIAGNOSIS — N186 End stage renal disease: Secondary | ICD-10-CM | POA: Diagnosis not present

## 2014-02-14 DIAGNOSIS — D631 Anemia in chronic kidney disease: Secondary | ICD-10-CM | POA: Diagnosis not present

## 2014-02-14 DIAGNOSIS — N2581 Secondary hyperparathyroidism of renal origin: Secondary | ICD-10-CM | POA: Diagnosis not present

## 2014-02-16 DIAGNOSIS — D631 Anemia in chronic kidney disease: Secondary | ICD-10-CM | POA: Diagnosis not present

## 2014-02-16 DIAGNOSIS — N186 End stage renal disease: Secondary | ICD-10-CM | POA: Diagnosis not present

## 2014-02-16 DIAGNOSIS — N2581 Secondary hyperparathyroidism of renal origin: Secondary | ICD-10-CM | POA: Diagnosis not present

## 2014-02-19 DIAGNOSIS — N186 End stage renal disease: Secondary | ICD-10-CM | POA: Diagnosis not present

## 2014-02-19 DIAGNOSIS — D631 Anemia in chronic kidney disease: Secondary | ICD-10-CM | POA: Diagnosis not present

## 2014-02-19 DIAGNOSIS — N2581 Secondary hyperparathyroidism of renal origin: Secondary | ICD-10-CM | POA: Diagnosis not present

## 2014-02-23 DIAGNOSIS — N186 End stage renal disease: Secondary | ICD-10-CM | POA: Diagnosis not present

## 2014-02-23 DIAGNOSIS — D631 Anemia in chronic kidney disease: Secondary | ICD-10-CM | POA: Diagnosis not present

## 2014-02-23 DIAGNOSIS — N2581 Secondary hyperparathyroidism of renal origin: Secondary | ICD-10-CM | POA: Diagnosis not present

## 2014-02-26 DIAGNOSIS — D631 Anemia in chronic kidney disease: Secondary | ICD-10-CM | POA: Diagnosis not present

## 2014-02-26 DIAGNOSIS — N2581 Secondary hyperparathyroidism of renal origin: Secondary | ICD-10-CM | POA: Diagnosis not present

## 2014-02-26 DIAGNOSIS — N186 End stage renal disease: Secondary | ICD-10-CM | POA: Diagnosis not present

## 2014-02-28 DIAGNOSIS — N2581 Secondary hyperparathyroidism of renal origin: Secondary | ICD-10-CM | POA: Diagnosis not present

## 2014-02-28 DIAGNOSIS — N186 End stage renal disease: Secondary | ICD-10-CM | POA: Diagnosis not present

## 2014-02-28 DIAGNOSIS — D631 Anemia in chronic kidney disease: Secondary | ICD-10-CM | POA: Diagnosis not present

## 2014-03-02 DIAGNOSIS — N2581 Secondary hyperparathyroidism of renal origin: Secondary | ICD-10-CM | POA: Diagnosis not present

## 2014-03-02 DIAGNOSIS — D631 Anemia in chronic kidney disease: Secondary | ICD-10-CM | POA: Diagnosis not present

## 2014-03-02 DIAGNOSIS — N186 End stage renal disease: Secondary | ICD-10-CM | POA: Diagnosis not present

## 2014-03-04 DIAGNOSIS — N186 End stage renal disease: Secondary | ICD-10-CM | POA: Diagnosis not present

## 2014-03-04 DIAGNOSIS — Z992 Dependence on renal dialysis: Secondary | ICD-10-CM | POA: Diagnosis not present

## 2014-03-05 DIAGNOSIS — N186 End stage renal disease: Secondary | ICD-10-CM | POA: Diagnosis not present

## 2014-03-05 DIAGNOSIS — N2581 Secondary hyperparathyroidism of renal origin: Secondary | ICD-10-CM | POA: Diagnosis not present

## 2014-03-05 DIAGNOSIS — D631 Anemia in chronic kidney disease: Secondary | ICD-10-CM | POA: Diagnosis not present

## 2014-03-07 DIAGNOSIS — D631 Anemia in chronic kidney disease: Secondary | ICD-10-CM | POA: Diagnosis not present

## 2014-03-07 DIAGNOSIS — N2581 Secondary hyperparathyroidism of renal origin: Secondary | ICD-10-CM | POA: Diagnosis not present

## 2014-03-07 DIAGNOSIS — N186 End stage renal disease: Secondary | ICD-10-CM | POA: Diagnosis not present

## 2014-03-09 DIAGNOSIS — N2581 Secondary hyperparathyroidism of renal origin: Secondary | ICD-10-CM | POA: Diagnosis not present

## 2014-03-09 DIAGNOSIS — N186 End stage renal disease: Secondary | ICD-10-CM | POA: Diagnosis not present

## 2014-03-09 DIAGNOSIS — D631 Anemia in chronic kidney disease: Secondary | ICD-10-CM | POA: Diagnosis not present

## 2014-03-12 DIAGNOSIS — N186 End stage renal disease: Secondary | ICD-10-CM | POA: Diagnosis not present

## 2014-03-12 DIAGNOSIS — D631 Anemia in chronic kidney disease: Secondary | ICD-10-CM | POA: Diagnosis not present

## 2014-03-12 DIAGNOSIS — N2581 Secondary hyperparathyroidism of renal origin: Secondary | ICD-10-CM | POA: Diagnosis not present

## 2014-03-14 DIAGNOSIS — N2581 Secondary hyperparathyroidism of renal origin: Secondary | ICD-10-CM | POA: Diagnosis not present

## 2014-03-14 DIAGNOSIS — D631 Anemia in chronic kidney disease: Secondary | ICD-10-CM | POA: Diagnosis not present

## 2014-03-14 DIAGNOSIS — N186 End stage renal disease: Secondary | ICD-10-CM | POA: Diagnosis not present

## 2014-03-16 DIAGNOSIS — N2581 Secondary hyperparathyroidism of renal origin: Secondary | ICD-10-CM | POA: Diagnosis not present

## 2014-03-16 DIAGNOSIS — N186 End stage renal disease: Secondary | ICD-10-CM | POA: Diagnosis not present

## 2014-03-16 DIAGNOSIS — D631 Anemia in chronic kidney disease: Secondary | ICD-10-CM | POA: Diagnosis not present

## 2014-03-19 DIAGNOSIS — N2581 Secondary hyperparathyroidism of renal origin: Secondary | ICD-10-CM | POA: Diagnosis not present

## 2014-03-19 DIAGNOSIS — N186 End stage renal disease: Secondary | ICD-10-CM | POA: Diagnosis not present

## 2014-03-19 DIAGNOSIS — D631 Anemia in chronic kidney disease: Secondary | ICD-10-CM | POA: Diagnosis not present

## 2014-03-21 DIAGNOSIS — N2581 Secondary hyperparathyroidism of renal origin: Secondary | ICD-10-CM | POA: Diagnosis not present

## 2014-03-21 DIAGNOSIS — D631 Anemia in chronic kidney disease: Secondary | ICD-10-CM | POA: Diagnosis not present

## 2014-03-21 DIAGNOSIS — N186 End stage renal disease: Secondary | ICD-10-CM | POA: Diagnosis not present

## 2014-03-22 DIAGNOSIS — M79604 Pain in right leg: Secondary | ICD-10-CM | POA: Diagnosis not present

## 2014-03-22 DIAGNOSIS — M79651 Pain in right thigh: Secondary | ICD-10-CM | POA: Diagnosis not present

## 2014-03-22 DIAGNOSIS — Z992 Dependence on renal dialysis: Secondary | ICD-10-CM | POA: Diagnosis not present

## 2014-03-22 DIAGNOSIS — Z833 Family history of diabetes mellitus: Secondary | ICD-10-CM | POA: Diagnosis not present

## 2014-03-22 DIAGNOSIS — N186 End stage renal disease: Secondary | ICD-10-CM | POA: Diagnosis not present

## 2014-03-22 DIAGNOSIS — M25551 Pain in right hip: Secondary | ICD-10-CM | POA: Diagnosis not present

## 2014-03-22 DIAGNOSIS — I120 Hypertensive chronic kidney disease with stage 5 chronic kidney disease or end stage renal disease: Secondary | ICD-10-CM | POA: Diagnosis not present

## 2014-03-22 DIAGNOSIS — Z79899 Other long term (current) drug therapy: Secondary | ICD-10-CM | POA: Diagnosis not present

## 2014-03-22 DIAGNOSIS — Z5181 Encounter for therapeutic drug level monitoring: Secondary | ICD-10-CM | POA: Diagnosis not present

## 2014-03-22 DIAGNOSIS — Z8249 Family history of ischemic heart disease and other diseases of the circulatory system: Secondary | ICD-10-CM | POA: Diagnosis not present

## 2014-03-23 DIAGNOSIS — M79604 Pain in right leg: Secondary | ICD-10-CM | POA: Diagnosis not present

## 2014-03-23 DIAGNOSIS — N2581 Secondary hyperparathyroidism of renal origin: Secondary | ICD-10-CM | POA: Diagnosis not present

## 2014-03-23 DIAGNOSIS — D631 Anemia in chronic kidney disease: Secondary | ICD-10-CM | POA: Diagnosis not present

## 2014-03-23 DIAGNOSIS — N186 End stage renal disease: Secondary | ICD-10-CM | POA: Diagnosis not present

## 2014-03-25 DIAGNOSIS — Z992 Dependence on renal dialysis: Secondary | ICD-10-CM | POA: Diagnosis not present

## 2014-03-25 DIAGNOSIS — N186 End stage renal disease: Secondary | ICD-10-CM | POA: Diagnosis not present

## 2014-03-26 DIAGNOSIS — R0602 Shortness of breath: Secondary | ICD-10-CM | POA: Diagnosis not present

## 2014-03-26 DIAGNOSIS — G92 Toxic encephalopathy: Secondary | ICD-10-CM | POA: Diagnosis not present

## 2014-03-26 DIAGNOSIS — J9601 Acute respiratory failure with hypoxia: Secondary | ICD-10-CM | POA: Diagnosis not present

## 2014-03-26 DIAGNOSIS — N185 Chronic kidney disease, stage 5: Secondary | ICD-10-CM | POA: Diagnosis not present

## 2014-03-26 DIAGNOSIS — J810 Acute pulmonary edema: Secondary | ICD-10-CM | POA: Diagnosis not present

## 2014-03-26 DIAGNOSIS — T8612 Kidney transplant failure: Secondary | ICD-10-CM | POA: Diagnosis not present

## 2014-03-26 DIAGNOSIS — J811 Chronic pulmonary edema: Secondary | ICD-10-CM | POA: Diagnosis not present

## 2014-03-26 DIAGNOSIS — R0600 Dyspnea, unspecified: Secondary | ICD-10-CM | POA: Diagnosis not present

## 2014-03-26 DIAGNOSIS — J189 Pneumonia, unspecified organism: Secondary | ICD-10-CM | POA: Diagnosis not present

## 2014-03-26 DIAGNOSIS — J9602 Acute respiratory failure with hypercapnia: Secondary | ICD-10-CM | POA: Diagnosis not present

## 2014-03-26 DIAGNOSIS — N2581 Secondary hyperparathyroidism of renal origin: Secondary | ICD-10-CM | POA: Diagnosis not present

## 2014-03-26 DIAGNOSIS — D631 Anemia in chronic kidney disease: Secondary | ICD-10-CM | POA: Diagnosis not present

## 2014-03-26 DIAGNOSIS — N186 End stage renal disease: Secondary | ICD-10-CM | POA: Diagnosis not present

## 2014-03-26 DIAGNOSIS — I959 Hypotension, unspecified: Secondary | ICD-10-CM | POA: Diagnosis not present

## 2014-03-27 DIAGNOSIS — J8489 Other specified interstitial pulmonary diseases: Secondary | ICD-10-CM | POA: Diagnosis present

## 2014-03-27 DIAGNOSIS — I5032 Chronic diastolic (congestive) heart failure: Secondary | ICD-10-CM | POA: Diagnosis present

## 2014-03-27 DIAGNOSIS — J811 Chronic pulmonary edema: Secondary | ICD-10-CM | POA: Diagnosis present

## 2014-03-27 DIAGNOSIS — I953 Hypotension of hemodialysis: Secondary | ICD-10-CM | POA: Diagnosis present

## 2014-03-27 DIAGNOSIS — M13851 Other specified arthritis, right hip: Secondary | ICD-10-CM | POA: Diagnosis present

## 2014-03-27 DIAGNOSIS — G92 Toxic encephalopathy: Secondary | ICD-10-CM | POA: Diagnosis not present

## 2014-03-27 DIAGNOSIS — I959 Hypotension, unspecified: Secondary | ICD-10-CM | POA: Diagnosis not present

## 2014-03-27 DIAGNOSIS — J9692 Respiratory failure, unspecified with hypercapnia: Secondary | ICD-10-CM | POA: Diagnosis not present

## 2014-03-27 DIAGNOSIS — J9621 Acute and chronic respiratory failure with hypoxia: Secondary | ICD-10-CM | POA: Diagnosis not present

## 2014-03-27 DIAGNOSIS — J984 Other disorders of lung: Secondary | ICD-10-CM | POA: Diagnosis present

## 2014-03-27 DIAGNOSIS — J9690 Respiratory failure, unspecified, unspecified whether with hypoxia or hypercapnia: Secondary | ICD-10-CM | POA: Diagnosis not present

## 2014-03-27 DIAGNOSIS — I272 Other secondary pulmonary hypertension: Secondary | ICD-10-CM | POA: Diagnosis not present

## 2014-03-27 DIAGNOSIS — J9601 Acute respiratory failure with hypoxia: Secondary | ICD-10-CM | POA: Diagnosis not present

## 2014-03-27 DIAGNOSIS — D649 Anemia, unspecified: Secondary | ICD-10-CM | POA: Diagnosis not present

## 2014-03-27 DIAGNOSIS — J159 Unspecified bacterial pneumonia: Secondary | ICD-10-CM | POA: Diagnosis not present

## 2014-03-27 DIAGNOSIS — N186 End stage renal disease: Secondary | ICD-10-CM | POA: Diagnosis present

## 2014-03-27 DIAGNOSIS — Z992 Dependence on renal dialysis: Secondary | ICD-10-CM | POA: Diagnosis not present

## 2014-03-27 DIAGNOSIS — T8612 Kidney transplant failure: Secondary | ICD-10-CM | POA: Diagnosis present

## 2014-03-27 DIAGNOSIS — N185 Chronic kidney disease, stage 5: Secondary | ICD-10-CM | POA: Diagnosis not present

## 2014-03-27 DIAGNOSIS — J386 Stenosis of larynx: Secondary | ICD-10-CM | POA: Diagnosis present

## 2014-03-27 DIAGNOSIS — F17210 Nicotine dependence, cigarettes, uncomplicated: Secondary | ICD-10-CM | POA: Diagnosis present

## 2014-03-27 DIAGNOSIS — J189 Pneumonia, unspecified organism: Secondary | ICD-10-CM | POA: Diagnosis present

## 2014-03-27 DIAGNOSIS — J810 Acute pulmonary edema: Secondary | ICD-10-CM | POA: Diagnosis not present

## 2014-03-27 DIAGNOSIS — I150 Renovascular hypertension: Secondary | ICD-10-CM | POA: Diagnosis not present

## 2014-03-27 DIAGNOSIS — J9691 Respiratory failure, unspecified with hypoxia: Secondary | ICD-10-CM | POA: Diagnosis not present

## 2014-03-27 DIAGNOSIS — J9602 Acute respiratory failure with hypercapnia: Secondary | ICD-10-CM | POA: Diagnosis present

## 2014-03-27 DIAGNOSIS — D631 Anemia in chronic kidney disease: Secondary | ICD-10-CM | POA: Diagnosis present

## 2014-03-27 DIAGNOSIS — R0602 Shortness of breath: Secondary | ICD-10-CM | POA: Diagnosis not present

## 2014-03-27 DIAGNOSIS — I120 Hypertensive chronic kidney disease with stage 5 chronic kidney disease or end stage renal disease: Secondary | ICD-10-CM | POA: Diagnosis present

## 2014-03-27 DIAGNOSIS — R0600 Dyspnea, unspecified: Secondary | ICD-10-CM | POA: Diagnosis not present

## 2014-03-27 DIAGNOSIS — T50995A Adverse effect of other drugs, medicaments and biological substances, initial encounter: Secondary | ICD-10-CM | POA: Diagnosis present

## 2014-03-28 DIAGNOSIS — J810 Acute pulmonary edema: Secondary | ICD-10-CM | POA: Diagnosis not present

## 2014-04-04 DIAGNOSIS — N2581 Secondary hyperparathyroidism of renal origin: Secondary | ICD-10-CM | POA: Diagnosis not present

## 2014-04-04 DIAGNOSIS — N186 End stage renal disease: Secondary | ICD-10-CM | POA: Diagnosis not present

## 2014-04-04 DIAGNOSIS — D631 Anemia in chronic kidney disease: Secondary | ICD-10-CM | POA: Diagnosis not present

## 2014-04-06 DIAGNOSIS — N2581 Secondary hyperparathyroidism of renal origin: Secondary | ICD-10-CM | POA: Diagnosis not present

## 2014-04-06 DIAGNOSIS — N186 End stage renal disease: Secondary | ICD-10-CM | POA: Diagnosis not present

## 2014-04-06 DIAGNOSIS — D631 Anemia in chronic kidney disease: Secondary | ICD-10-CM | POA: Diagnosis not present

## 2014-04-09 DIAGNOSIS — D631 Anemia in chronic kidney disease: Secondary | ICD-10-CM | POA: Diagnosis not present

## 2014-04-09 DIAGNOSIS — N2581 Secondary hyperparathyroidism of renal origin: Secondary | ICD-10-CM | POA: Diagnosis not present

## 2014-04-09 DIAGNOSIS — N186 End stage renal disease: Secondary | ICD-10-CM | POA: Diagnosis not present

## 2014-04-11 DIAGNOSIS — J849 Interstitial pulmonary disease, unspecified: Secondary | ICD-10-CM | POA: Diagnosis not present

## 2014-04-13 DIAGNOSIS — N2581 Secondary hyperparathyroidism of renal origin: Secondary | ICD-10-CM | POA: Diagnosis not present

## 2014-04-13 DIAGNOSIS — D631 Anemia in chronic kidney disease: Secondary | ICD-10-CM | POA: Diagnosis not present

## 2014-04-13 DIAGNOSIS — N186 End stage renal disease: Secondary | ICD-10-CM | POA: Diagnosis not present

## 2014-04-16 DIAGNOSIS — N186 End stage renal disease: Secondary | ICD-10-CM | POA: Diagnosis not present

## 2014-04-16 DIAGNOSIS — N2581 Secondary hyperparathyroidism of renal origin: Secondary | ICD-10-CM | POA: Diagnosis not present

## 2014-04-16 DIAGNOSIS — D631 Anemia in chronic kidney disease: Secondary | ICD-10-CM | POA: Diagnosis not present

## 2014-04-18 DIAGNOSIS — N2581 Secondary hyperparathyroidism of renal origin: Secondary | ICD-10-CM | POA: Diagnosis not present

## 2014-04-18 DIAGNOSIS — N186 End stage renal disease: Secondary | ICD-10-CM | POA: Diagnosis not present

## 2014-04-18 DIAGNOSIS — D631 Anemia in chronic kidney disease: Secondary | ICD-10-CM | POA: Diagnosis not present

## 2014-04-20 DIAGNOSIS — D631 Anemia in chronic kidney disease: Secondary | ICD-10-CM | POA: Diagnosis not present

## 2014-04-20 DIAGNOSIS — N2581 Secondary hyperparathyroidism of renal origin: Secondary | ICD-10-CM | POA: Diagnosis not present

## 2014-04-20 DIAGNOSIS — N186 End stage renal disease: Secondary | ICD-10-CM | POA: Diagnosis not present

## 2014-04-23 DIAGNOSIS — N186 End stage renal disease: Secondary | ICD-10-CM | POA: Diagnosis not present

## 2014-04-23 DIAGNOSIS — D631 Anemia in chronic kidney disease: Secondary | ICD-10-CM | POA: Diagnosis not present

## 2014-04-23 DIAGNOSIS — N2581 Secondary hyperparathyroidism of renal origin: Secondary | ICD-10-CM | POA: Diagnosis not present

## 2014-04-27 DIAGNOSIS — N2581 Secondary hyperparathyroidism of renal origin: Secondary | ICD-10-CM | POA: Diagnosis not present

## 2014-04-27 DIAGNOSIS — D631 Anemia in chronic kidney disease: Secondary | ICD-10-CM | POA: Diagnosis not present

## 2014-04-27 DIAGNOSIS — N186 End stage renal disease: Secondary | ICD-10-CM | POA: Diagnosis not present

## 2014-04-30 DIAGNOSIS — N186 End stage renal disease: Secondary | ICD-10-CM | POA: Diagnosis not present

## 2014-04-30 DIAGNOSIS — D631 Anemia in chronic kidney disease: Secondary | ICD-10-CM | POA: Diagnosis not present

## 2014-04-30 DIAGNOSIS — N2581 Secondary hyperparathyroidism of renal origin: Secondary | ICD-10-CM | POA: Diagnosis not present

## 2014-05-02 DIAGNOSIS — N2581 Secondary hyperparathyroidism of renal origin: Secondary | ICD-10-CM | POA: Diagnosis not present

## 2014-05-02 DIAGNOSIS — D631 Anemia in chronic kidney disease: Secondary | ICD-10-CM | POA: Diagnosis not present

## 2014-05-02 DIAGNOSIS — N186 End stage renal disease: Secondary | ICD-10-CM | POA: Diagnosis not present

## 2014-05-03 DIAGNOSIS — N186 End stage renal disease: Secondary | ICD-10-CM | POA: Diagnosis not present

## 2014-05-03 DIAGNOSIS — Z992 Dependence on renal dialysis: Secondary | ICD-10-CM | POA: Diagnosis not present

## 2014-05-04 DIAGNOSIS — D631 Anemia in chronic kidney disease: Secondary | ICD-10-CM | POA: Diagnosis not present

## 2014-05-04 DIAGNOSIS — N2581 Secondary hyperparathyroidism of renal origin: Secondary | ICD-10-CM | POA: Diagnosis not present

## 2014-05-04 DIAGNOSIS — N186 End stage renal disease: Secondary | ICD-10-CM | POA: Diagnosis not present

## 2014-05-07 DIAGNOSIS — D631 Anemia in chronic kidney disease: Secondary | ICD-10-CM | POA: Diagnosis not present

## 2014-05-07 DIAGNOSIS — N2581 Secondary hyperparathyroidism of renal origin: Secondary | ICD-10-CM | POA: Diagnosis not present

## 2014-05-07 DIAGNOSIS — N186 End stage renal disease: Secondary | ICD-10-CM | POA: Diagnosis not present

## 2014-05-09 DIAGNOSIS — D631 Anemia in chronic kidney disease: Secondary | ICD-10-CM | POA: Diagnosis not present

## 2014-05-09 DIAGNOSIS — N186 End stage renal disease: Secondary | ICD-10-CM | POA: Diagnosis not present

## 2014-05-09 DIAGNOSIS — N2581 Secondary hyperparathyroidism of renal origin: Secondary | ICD-10-CM | POA: Diagnosis not present

## 2014-05-11 DIAGNOSIS — D631 Anemia in chronic kidney disease: Secondary | ICD-10-CM | POA: Diagnosis not present

## 2014-05-11 DIAGNOSIS — N2581 Secondary hyperparathyroidism of renal origin: Secondary | ICD-10-CM | POA: Diagnosis not present

## 2014-05-11 DIAGNOSIS — N186 End stage renal disease: Secondary | ICD-10-CM | POA: Diagnosis not present

## 2014-05-14 DIAGNOSIS — N186 End stage renal disease: Secondary | ICD-10-CM | POA: Diagnosis not present

## 2014-05-14 DIAGNOSIS — N2581 Secondary hyperparathyroidism of renal origin: Secondary | ICD-10-CM | POA: Diagnosis not present

## 2014-05-14 DIAGNOSIS — D631 Anemia in chronic kidney disease: Secondary | ICD-10-CM | POA: Diagnosis not present

## 2014-05-16 DIAGNOSIS — D631 Anemia in chronic kidney disease: Secondary | ICD-10-CM | POA: Diagnosis not present

## 2014-05-16 DIAGNOSIS — N2581 Secondary hyperparathyroidism of renal origin: Secondary | ICD-10-CM | POA: Diagnosis not present

## 2014-05-16 DIAGNOSIS — N186 End stage renal disease: Secondary | ICD-10-CM | POA: Diagnosis not present

## 2014-05-18 DIAGNOSIS — N186 End stage renal disease: Secondary | ICD-10-CM | POA: Diagnosis not present

## 2014-05-18 DIAGNOSIS — N2581 Secondary hyperparathyroidism of renal origin: Secondary | ICD-10-CM | POA: Diagnosis not present

## 2014-05-18 DIAGNOSIS — D631 Anemia in chronic kidney disease: Secondary | ICD-10-CM | POA: Diagnosis not present

## 2014-05-23 DIAGNOSIS — N186 End stage renal disease: Secondary | ICD-10-CM | POA: Diagnosis not present

## 2014-05-23 DIAGNOSIS — N2581 Secondary hyperparathyroidism of renal origin: Secondary | ICD-10-CM | POA: Diagnosis not present

## 2014-05-23 DIAGNOSIS — D631 Anemia in chronic kidney disease: Secondary | ICD-10-CM | POA: Diagnosis not present

## 2014-05-25 DIAGNOSIS — N186 End stage renal disease: Secondary | ICD-10-CM | POA: Diagnosis not present

## 2014-05-25 DIAGNOSIS — D631 Anemia in chronic kidney disease: Secondary | ICD-10-CM | POA: Diagnosis not present

## 2014-05-25 DIAGNOSIS — N2581 Secondary hyperparathyroidism of renal origin: Secondary | ICD-10-CM | POA: Diagnosis not present

## 2014-05-28 DIAGNOSIS — D631 Anemia in chronic kidney disease: Secondary | ICD-10-CM | POA: Diagnosis not present

## 2014-05-28 DIAGNOSIS — N186 End stage renal disease: Secondary | ICD-10-CM | POA: Diagnosis not present

## 2014-05-28 DIAGNOSIS — N2581 Secondary hyperparathyroidism of renal origin: Secondary | ICD-10-CM | POA: Diagnosis not present

## 2014-06-01 DIAGNOSIS — N2581 Secondary hyperparathyroidism of renal origin: Secondary | ICD-10-CM | POA: Diagnosis not present

## 2014-06-01 DIAGNOSIS — N186 End stage renal disease: Secondary | ICD-10-CM | POA: Diagnosis not present

## 2014-06-01 DIAGNOSIS — D631 Anemia in chronic kidney disease: Secondary | ICD-10-CM | POA: Diagnosis not present

## 2014-06-02 DIAGNOSIS — Z992 Dependence on renal dialysis: Secondary | ICD-10-CM | POA: Diagnosis not present

## 2014-06-02 DIAGNOSIS — N186 End stage renal disease: Secondary | ICD-10-CM | POA: Diagnosis not present

## 2014-06-05 DIAGNOSIS — N186 End stage renal disease: Secondary | ICD-10-CM | POA: Diagnosis not present

## 2014-06-05 DIAGNOSIS — N2581 Secondary hyperparathyroidism of renal origin: Secondary | ICD-10-CM | POA: Diagnosis not present

## 2014-06-05 DIAGNOSIS — D631 Anemia in chronic kidney disease: Secondary | ICD-10-CM | POA: Diagnosis not present

## 2014-06-08 DIAGNOSIS — D631 Anemia in chronic kidney disease: Secondary | ICD-10-CM | POA: Diagnosis not present

## 2014-06-08 DIAGNOSIS — N186 End stage renal disease: Secondary | ICD-10-CM | POA: Diagnosis not present

## 2014-06-08 DIAGNOSIS — N2581 Secondary hyperparathyroidism of renal origin: Secondary | ICD-10-CM | POA: Diagnosis not present

## 2014-06-12 DIAGNOSIS — D631 Anemia in chronic kidney disease: Secondary | ICD-10-CM | POA: Diagnosis not present

## 2014-06-12 DIAGNOSIS — N2581 Secondary hyperparathyroidism of renal origin: Secondary | ICD-10-CM | POA: Diagnosis not present

## 2014-06-12 DIAGNOSIS — N186 End stage renal disease: Secondary | ICD-10-CM | POA: Diagnosis not present

## 2014-06-15 DIAGNOSIS — N186 End stage renal disease: Secondary | ICD-10-CM | POA: Diagnosis not present

## 2014-06-15 DIAGNOSIS — D631 Anemia in chronic kidney disease: Secondary | ICD-10-CM | POA: Diagnosis not present

## 2014-06-15 DIAGNOSIS — N2581 Secondary hyperparathyroidism of renal origin: Secondary | ICD-10-CM | POA: Diagnosis not present

## 2014-06-19 DIAGNOSIS — N2581 Secondary hyperparathyroidism of renal origin: Secondary | ICD-10-CM | POA: Diagnosis not present

## 2014-06-19 DIAGNOSIS — N186 End stage renal disease: Secondary | ICD-10-CM | POA: Diagnosis not present

## 2014-06-19 DIAGNOSIS — Z992 Dependence on renal dialysis: Secondary | ICD-10-CM | POA: Diagnosis not present

## 2014-06-19 DIAGNOSIS — D631 Anemia in chronic kidney disease: Secondary | ICD-10-CM | POA: Diagnosis not present

## 2014-06-22 DIAGNOSIS — N186 End stage renal disease: Secondary | ICD-10-CM | POA: Diagnosis not present

## 2014-06-22 DIAGNOSIS — N2581 Secondary hyperparathyroidism of renal origin: Secondary | ICD-10-CM | POA: Diagnosis not present

## 2014-06-22 DIAGNOSIS — Z992 Dependence on renal dialysis: Secondary | ICD-10-CM | POA: Diagnosis not present

## 2014-06-22 DIAGNOSIS — D631 Anemia in chronic kidney disease: Secondary | ICD-10-CM | POA: Diagnosis not present

## 2014-06-28 DIAGNOSIS — D631 Anemia in chronic kidney disease: Secondary | ICD-10-CM | POA: Diagnosis not present

## 2014-06-28 DIAGNOSIS — Z992 Dependence on renal dialysis: Secondary | ICD-10-CM | POA: Diagnosis not present

## 2014-06-28 DIAGNOSIS — N2581 Secondary hyperparathyroidism of renal origin: Secondary | ICD-10-CM | POA: Diagnosis not present

## 2014-06-28 DIAGNOSIS — N186 End stage renal disease: Secondary | ICD-10-CM | POA: Diagnosis not present

## 2014-06-30 DIAGNOSIS — N186 End stage renal disease: Secondary | ICD-10-CM | POA: Diagnosis not present

## 2014-06-30 DIAGNOSIS — N2581 Secondary hyperparathyroidism of renal origin: Secondary | ICD-10-CM | POA: Diagnosis not present

## 2014-06-30 DIAGNOSIS — Z992 Dependence on renal dialysis: Secondary | ICD-10-CM | POA: Diagnosis not present

## 2014-06-30 DIAGNOSIS — D631 Anemia in chronic kidney disease: Secondary | ICD-10-CM | POA: Diagnosis not present

## 2014-07-03 DIAGNOSIS — D631 Anemia in chronic kidney disease: Secondary | ICD-10-CM | POA: Diagnosis not present

## 2014-07-03 DIAGNOSIS — Z992 Dependence on renal dialysis: Secondary | ICD-10-CM | POA: Diagnosis not present

## 2014-07-03 DIAGNOSIS — N186 End stage renal disease: Secondary | ICD-10-CM | POA: Diagnosis not present

## 2014-07-03 DIAGNOSIS — N2581 Secondary hyperparathyroidism of renal origin: Secondary | ICD-10-CM | POA: Diagnosis not present

## 2014-07-05 DIAGNOSIS — D509 Iron deficiency anemia, unspecified: Secondary | ICD-10-CM | POA: Diagnosis not present

## 2014-07-05 DIAGNOSIS — D631 Anemia in chronic kidney disease: Secondary | ICD-10-CM | POA: Diagnosis not present

## 2014-07-05 DIAGNOSIS — N186 End stage renal disease: Secondary | ICD-10-CM | POA: Diagnosis not present

## 2014-07-05 DIAGNOSIS — L299 Pruritus, unspecified: Secondary | ICD-10-CM | POA: Diagnosis not present

## 2014-07-05 DIAGNOSIS — N2581 Secondary hyperparathyroidism of renal origin: Secondary | ICD-10-CM | POA: Diagnosis not present

## 2014-07-07 DIAGNOSIS — N2581 Secondary hyperparathyroidism of renal origin: Secondary | ICD-10-CM | POA: Diagnosis not present

## 2014-07-07 DIAGNOSIS — N186 End stage renal disease: Secondary | ICD-10-CM | POA: Diagnosis not present

## 2014-07-07 DIAGNOSIS — L299 Pruritus, unspecified: Secondary | ICD-10-CM | POA: Diagnosis not present

## 2014-07-07 DIAGNOSIS — D509 Iron deficiency anemia, unspecified: Secondary | ICD-10-CM | POA: Diagnosis not present

## 2014-07-07 DIAGNOSIS — D631 Anemia in chronic kidney disease: Secondary | ICD-10-CM | POA: Diagnosis not present

## 2014-07-10 DIAGNOSIS — D509 Iron deficiency anemia, unspecified: Secondary | ICD-10-CM | POA: Diagnosis not present

## 2014-07-10 DIAGNOSIS — N186 End stage renal disease: Secondary | ICD-10-CM | POA: Diagnosis not present

## 2014-07-10 DIAGNOSIS — D631 Anemia in chronic kidney disease: Secondary | ICD-10-CM | POA: Diagnosis not present

## 2014-07-10 DIAGNOSIS — L299 Pruritus, unspecified: Secondary | ICD-10-CM | POA: Diagnosis not present

## 2014-07-10 DIAGNOSIS — N2581 Secondary hyperparathyroidism of renal origin: Secondary | ICD-10-CM | POA: Diagnosis not present

## 2014-07-12 DIAGNOSIS — D631 Anemia in chronic kidney disease: Secondary | ICD-10-CM | POA: Diagnosis not present

## 2014-07-12 DIAGNOSIS — N2581 Secondary hyperparathyroidism of renal origin: Secondary | ICD-10-CM | POA: Diagnosis not present

## 2014-07-12 DIAGNOSIS — D509 Iron deficiency anemia, unspecified: Secondary | ICD-10-CM | POA: Diagnosis not present

## 2014-07-12 DIAGNOSIS — N186 End stage renal disease: Secondary | ICD-10-CM | POA: Diagnosis not present

## 2014-07-12 DIAGNOSIS — L299 Pruritus, unspecified: Secondary | ICD-10-CM | POA: Diagnosis not present

## 2014-07-14 DIAGNOSIS — D631 Anemia in chronic kidney disease: Secondary | ICD-10-CM | POA: Diagnosis not present

## 2014-07-14 DIAGNOSIS — N2581 Secondary hyperparathyroidism of renal origin: Secondary | ICD-10-CM | POA: Diagnosis not present

## 2014-07-14 DIAGNOSIS — N186 End stage renal disease: Secondary | ICD-10-CM | POA: Diagnosis not present

## 2014-07-14 DIAGNOSIS — L299 Pruritus, unspecified: Secondary | ICD-10-CM | POA: Diagnosis not present

## 2014-07-14 DIAGNOSIS — D509 Iron deficiency anemia, unspecified: Secondary | ICD-10-CM | POA: Diagnosis not present

## 2014-07-17 DIAGNOSIS — D509 Iron deficiency anemia, unspecified: Secondary | ICD-10-CM | POA: Diagnosis not present

## 2014-07-17 DIAGNOSIS — L299 Pruritus, unspecified: Secondary | ICD-10-CM | POA: Diagnosis not present

## 2014-07-17 DIAGNOSIS — N2581 Secondary hyperparathyroidism of renal origin: Secondary | ICD-10-CM | POA: Diagnosis not present

## 2014-07-17 DIAGNOSIS — N186 End stage renal disease: Secondary | ICD-10-CM | POA: Diagnosis not present

## 2014-07-17 DIAGNOSIS — D631 Anemia in chronic kidney disease: Secondary | ICD-10-CM | POA: Diagnosis not present

## 2014-07-19 DIAGNOSIS — D509 Iron deficiency anemia, unspecified: Secondary | ICD-10-CM | POA: Diagnosis not present

## 2014-07-19 DIAGNOSIS — N186 End stage renal disease: Secondary | ICD-10-CM | POA: Diagnosis not present

## 2014-07-19 DIAGNOSIS — L299 Pruritus, unspecified: Secondary | ICD-10-CM | POA: Diagnosis not present

## 2014-07-19 DIAGNOSIS — D631 Anemia in chronic kidney disease: Secondary | ICD-10-CM | POA: Diagnosis not present

## 2014-07-19 DIAGNOSIS — N2581 Secondary hyperparathyroidism of renal origin: Secondary | ICD-10-CM | POA: Diagnosis not present

## 2014-07-21 DIAGNOSIS — D631 Anemia in chronic kidney disease: Secondary | ICD-10-CM | POA: Diagnosis not present

## 2014-07-21 DIAGNOSIS — N2581 Secondary hyperparathyroidism of renal origin: Secondary | ICD-10-CM | POA: Diagnosis not present

## 2014-07-21 DIAGNOSIS — L299 Pruritus, unspecified: Secondary | ICD-10-CM | POA: Diagnosis not present

## 2014-07-21 DIAGNOSIS — D509 Iron deficiency anemia, unspecified: Secondary | ICD-10-CM | POA: Diagnosis not present

## 2014-07-21 DIAGNOSIS — N186 End stage renal disease: Secondary | ICD-10-CM | POA: Diagnosis not present

## 2014-07-24 DIAGNOSIS — D631 Anemia in chronic kidney disease: Secondary | ICD-10-CM | POA: Diagnosis not present

## 2014-07-24 DIAGNOSIS — L299 Pruritus, unspecified: Secondary | ICD-10-CM | POA: Diagnosis not present

## 2014-07-24 DIAGNOSIS — N186 End stage renal disease: Secondary | ICD-10-CM | POA: Diagnosis not present

## 2014-07-24 DIAGNOSIS — N2581 Secondary hyperparathyroidism of renal origin: Secondary | ICD-10-CM | POA: Diagnosis not present

## 2014-07-24 DIAGNOSIS — D509 Iron deficiency anemia, unspecified: Secondary | ICD-10-CM | POA: Diagnosis not present

## 2014-07-26 DIAGNOSIS — L299 Pruritus, unspecified: Secondary | ICD-10-CM | POA: Diagnosis not present

## 2014-07-26 DIAGNOSIS — N186 End stage renal disease: Secondary | ICD-10-CM | POA: Diagnosis not present

## 2014-07-26 DIAGNOSIS — D631 Anemia in chronic kidney disease: Secondary | ICD-10-CM | POA: Diagnosis not present

## 2014-07-26 DIAGNOSIS — D509 Iron deficiency anemia, unspecified: Secondary | ICD-10-CM | POA: Diagnosis not present

## 2014-07-26 DIAGNOSIS — N2581 Secondary hyperparathyroidism of renal origin: Secondary | ICD-10-CM | POA: Diagnosis not present

## 2014-07-28 DIAGNOSIS — D509 Iron deficiency anemia, unspecified: Secondary | ICD-10-CM | POA: Diagnosis not present

## 2014-07-28 DIAGNOSIS — D631 Anemia in chronic kidney disease: Secondary | ICD-10-CM | POA: Diagnosis not present

## 2014-07-28 DIAGNOSIS — L299 Pruritus, unspecified: Secondary | ICD-10-CM | POA: Diagnosis not present

## 2014-07-28 DIAGNOSIS — N2581 Secondary hyperparathyroidism of renal origin: Secondary | ICD-10-CM | POA: Diagnosis not present

## 2014-07-28 DIAGNOSIS — N186 End stage renal disease: Secondary | ICD-10-CM | POA: Diagnosis not present

## 2014-07-31 DIAGNOSIS — L299 Pruritus, unspecified: Secondary | ICD-10-CM | POA: Diagnosis not present

## 2014-07-31 DIAGNOSIS — D631 Anemia in chronic kidney disease: Secondary | ICD-10-CM | POA: Diagnosis not present

## 2014-07-31 DIAGNOSIS — N186 End stage renal disease: Secondary | ICD-10-CM | POA: Diagnosis not present

## 2014-07-31 DIAGNOSIS — D509 Iron deficiency anemia, unspecified: Secondary | ICD-10-CM | POA: Diagnosis not present

## 2014-07-31 DIAGNOSIS — N2581 Secondary hyperparathyroidism of renal origin: Secondary | ICD-10-CM | POA: Diagnosis not present

## 2014-08-02 DIAGNOSIS — Z992 Dependence on renal dialysis: Secondary | ICD-10-CM | POA: Diagnosis not present

## 2014-08-02 DIAGNOSIS — L299 Pruritus, unspecified: Secondary | ICD-10-CM | POA: Diagnosis not present

## 2014-08-02 DIAGNOSIS — N186 End stage renal disease: Secondary | ICD-10-CM | POA: Diagnosis not present

## 2014-08-02 DIAGNOSIS — D509 Iron deficiency anemia, unspecified: Secondary | ICD-10-CM | POA: Diagnosis not present

## 2014-08-02 DIAGNOSIS — N2581 Secondary hyperparathyroidism of renal origin: Secondary | ICD-10-CM | POA: Diagnosis not present

## 2014-08-02 DIAGNOSIS — D631 Anemia in chronic kidney disease: Secondary | ICD-10-CM | POA: Diagnosis not present

## 2014-08-04 DIAGNOSIS — N2581 Secondary hyperparathyroidism of renal origin: Secondary | ICD-10-CM | POA: Diagnosis not present

## 2014-08-04 DIAGNOSIS — D631 Anemia in chronic kidney disease: Secondary | ICD-10-CM | POA: Diagnosis not present

## 2014-08-04 DIAGNOSIS — N186 End stage renal disease: Secondary | ICD-10-CM | POA: Diagnosis not present

## 2014-08-04 DIAGNOSIS — D509 Iron deficiency anemia, unspecified: Secondary | ICD-10-CM | POA: Diagnosis not present

## 2014-08-07 DIAGNOSIS — N186 End stage renal disease: Secondary | ICD-10-CM | POA: Diagnosis not present

## 2014-08-07 DIAGNOSIS — D509 Iron deficiency anemia, unspecified: Secondary | ICD-10-CM | POA: Diagnosis not present

## 2014-08-07 DIAGNOSIS — N2581 Secondary hyperparathyroidism of renal origin: Secondary | ICD-10-CM | POA: Diagnosis not present

## 2014-08-07 DIAGNOSIS — D631 Anemia in chronic kidney disease: Secondary | ICD-10-CM | POA: Diagnosis not present

## 2014-08-09 DIAGNOSIS — N186 End stage renal disease: Secondary | ICD-10-CM | POA: Diagnosis not present

## 2014-08-09 DIAGNOSIS — D631 Anemia in chronic kidney disease: Secondary | ICD-10-CM | POA: Diagnosis not present

## 2014-08-09 DIAGNOSIS — N2581 Secondary hyperparathyroidism of renal origin: Secondary | ICD-10-CM | POA: Diagnosis not present

## 2014-08-09 DIAGNOSIS — D509 Iron deficiency anemia, unspecified: Secondary | ICD-10-CM | POA: Diagnosis not present

## 2014-08-11 DIAGNOSIS — D509 Iron deficiency anemia, unspecified: Secondary | ICD-10-CM | POA: Diagnosis not present

## 2014-08-11 DIAGNOSIS — N186 End stage renal disease: Secondary | ICD-10-CM | POA: Diagnosis not present

## 2014-08-11 DIAGNOSIS — N2581 Secondary hyperparathyroidism of renal origin: Secondary | ICD-10-CM | POA: Diagnosis not present

## 2014-08-11 DIAGNOSIS — D631 Anemia in chronic kidney disease: Secondary | ICD-10-CM | POA: Diagnosis not present

## 2014-08-14 DIAGNOSIS — D631 Anemia in chronic kidney disease: Secondary | ICD-10-CM | POA: Diagnosis not present

## 2014-08-14 DIAGNOSIS — N186 End stage renal disease: Secondary | ICD-10-CM | POA: Diagnosis not present

## 2014-08-14 DIAGNOSIS — D509 Iron deficiency anemia, unspecified: Secondary | ICD-10-CM | POA: Diagnosis not present

## 2014-08-14 DIAGNOSIS — N2581 Secondary hyperparathyroidism of renal origin: Secondary | ICD-10-CM | POA: Diagnosis not present

## 2014-08-16 DIAGNOSIS — N186 End stage renal disease: Secondary | ICD-10-CM | POA: Diagnosis not present

## 2014-08-16 DIAGNOSIS — D631 Anemia in chronic kidney disease: Secondary | ICD-10-CM | POA: Diagnosis not present

## 2014-08-16 DIAGNOSIS — N2581 Secondary hyperparathyroidism of renal origin: Secondary | ICD-10-CM | POA: Diagnosis not present

## 2014-08-16 DIAGNOSIS — D509 Iron deficiency anemia, unspecified: Secondary | ICD-10-CM | POA: Diagnosis not present

## 2014-08-18 DIAGNOSIS — N2581 Secondary hyperparathyroidism of renal origin: Secondary | ICD-10-CM | POA: Diagnosis not present

## 2014-08-18 DIAGNOSIS — D509 Iron deficiency anemia, unspecified: Secondary | ICD-10-CM | POA: Diagnosis not present

## 2014-08-18 DIAGNOSIS — N186 End stage renal disease: Secondary | ICD-10-CM | POA: Diagnosis not present

## 2014-08-18 DIAGNOSIS — D631 Anemia in chronic kidney disease: Secondary | ICD-10-CM | POA: Diagnosis not present

## 2014-08-21 DIAGNOSIS — N2581 Secondary hyperparathyroidism of renal origin: Secondary | ICD-10-CM | POA: Diagnosis not present

## 2014-08-21 DIAGNOSIS — D631 Anemia in chronic kidney disease: Secondary | ICD-10-CM | POA: Diagnosis not present

## 2014-08-21 DIAGNOSIS — D509 Iron deficiency anemia, unspecified: Secondary | ICD-10-CM | POA: Diagnosis not present

## 2014-08-21 DIAGNOSIS — N186 End stage renal disease: Secondary | ICD-10-CM | POA: Diagnosis not present

## 2014-08-23 DIAGNOSIS — N2581 Secondary hyperparathyroidism of renal origin: Secondary | ICD-10-CM | POA: Diagnosis not present

## 2014-08-23 DIAGNOSIS — D631 Anemia in chronic kidney disease: Secondary | ICD-10-CM | POA: Diagnosis not present

## 2014-08-23 DIAGNOSIS — N186 End stage renal disease: Secondary | ICD-10-CM | POA: Diagnosis not present

## 2014-08-23 DIAGNOSIS — D509 Iron deficiency anemia, unspecified: Secondary | ICD-10-CM | POA: Diagnosis not present

## 2014-08-25 DIAGNOSIS — D631 Anemia in chronic kidney disease: Secondary | ICD-10-CM | POA: Diagnosis not present

## 2014-08-25 DIAGNOSIS — N2581 Secondary hyperparathyroidism of renal origin: Secondary | ICD-10-CM | POA: Diagnosis not present

## 2014-08-25 DIAGNOSIS — N186 End stage renal disease: Secondary | ICD-10-CM | POA: Diagnosis not present

## 2014-08-25 DIAGNOSIS — D509 Iron deficiency anemia, unspecified: Secondary | ICD-10-CM | POA: Diagnosis not present

## 2014-08-28 DIAGNOSIS — N2581 Secondary hyperparathyroidism of renal origin: Secondary | ICD-10-CM | POA: Diagnosis not present

## 2014-08-28 DIAGNOSIS — D509 Iron deficiency anemia, unspecified: Secondary | ICD-10-CM | POA: Diagnosis not present

## 2014-08-28 DIAGNOSIS — D631 Anemia in chronic kidney disease: Secondary | ICD-10-CM | POA: Diagnosis not present

## 2014-08-28 DIAGNOSIS — N186 End stage renal disease: Secondary | ICD-10-CM | POA: Diagnosis not present

## 2014-08-30 DIAGNOSIS — N2581 Secondary hyperparathyroidism of renal origin: Secondary | ICD-10-CM | POA: Diagnosis not present

## 2014-08-30 DIAGNOSIS — N186 End stage renal disease: Secondary | ICD-10-CM | POA: Diagnosis not present

## 2014-08-30 DIAGNOSIS — D509 Iron deficiency anemia, unspecified: Secondary | ICD-10-CM | POA: Diagnosis not present

## 2014-08-30 DIAGNOSIS — D631 Anemia in chronic kidney disease: Secondary | ICD-10-CM | POA: Diagnosis not present

## 2014-09-01 DIAGNOSIS — D631 Anemia in chronic kidney disease: Secondary | ICD-10-CM | POA: Diagnosis not present

## 2014-09-01 DIAGNOSIS — D509 Iron deficiency anemia, unspecified: Secondary | ICD-10-CM | POA: Diagnosis not present

## 2014-09-01 DIAGNOSIS — N186 End stage renal disease: Secondary | ICD-10-CM | POA: Diagnosis not present

## 2014-09-01 DIAGNOSIS — N2581 Secondary hyperparathyroidism of renal origin: Secondary | ICD-10-CM | POA: Diagnosis not present

## 2014-09-02 DIAGNOSIS — N186 End stage renal disease: Secondary | ICD-10-CM | POA: Diagnosis not present

## 2014-09-02 DIAGNOSIS — Z992 Dependence on renal dialysis: Secondary | ICD-10-CM | POA: Diagnosis not present

## 2014-09-03 DIAGNOSIS — R87612 Low grade squamous intraepithelial lesion on cytologic smear of cervix (LGSIL): Secondary | ICD-10-CM | POA: Diagnosis not present

## 2014-09-03 DIAGNOSIS — Z01419 Encounter for gynecological examination (general) (routine) without abnormal findings: Secondary | ICD-10-CM | POA: Diagnosis not present

## 2014-09-03 DIAGNOSIS — Z01411 Encounter for gynecological examination (general) (routine) with abnormal findings: Secondary | ICD-10-CM | POA: Diagnosis not present

## 2014-09-03 DIAGNOSIS — Z124 Encounter for screening for malignant neoplasm of cervix: Secondary | ICD-10-CM | POA: Diagnosis not present

## 2014-09-04 DIAGNOSIS — N186 End stage renal disease: Secondary | ICD-10-CM | POA: Diagnosis not present

## 2014-09-04 DIAGNOSIS — L299 Pruritus, unspecified: Secondary | ICD-10-CM | POA: Diagnosis not present

## 2014-09-04 DIAGNOSIS — D631 Anemia in chronic kidney disease: Secondary | ICD-10-CM | POA: Diagnosis not present

## 2014-09-04 DIAGNOSIS — N2581 Secondary hyperparathyroidism of renal origin: Secondary | ICD-10-CM | POA: Diagnosis not present

## 2014-09-04 DIAGNOSIS — D509 Iron deficiency anemia, unspecified: Secondary | ICD-10-CM | POA: Diagnosis not present

## 2014-09-06 DIAGNOSIS — N186 End stage renal disease: Secondary | ICD-10-CM | POA: Diagnosis not present

## 2014-09-06 DIAGNOSIS — D631 Anemia in chronic kidney disease: Secondary | ICD-10-CM | POA: Diagnosis not present

## 2014-09-06 DIAGNOSIS — D509 Iron deficiency anemia, unspecified: Secondary | ICD-10-CM | POA: Diagnosis not present

## 2014-09-06 DIAGNOSIS — L299 Pruritus, unspecified: Secondary | ICD-10-CM | POA: Diagnosis not present

## 2014-09-06 DIAGNOSIS — N2581 Secondary hyperparathyroidism of renal origin: Secondary | ICD-10-CM | POA: Diagnosis not present

## 2014-09-08 DIAGNOSIS — D631 Anemia in chronic kidney disease: Secondary | ICD-10-CM | POA: Diagnosis not present

## 2014-09-08 DIAGNOSIS — L299 Pruritus, unspecified: Secondary | ICD-10-CM | POA: Diagnosis not present

## 2014-09-08 DIAGNOSIS — N186 End stage renal disease: Secondary | ICD-10-CM | POA: Diagnosis not present

## 2014-09-08 DIAGNOSIS — D509 Iron deficiency anemia, unspecified: Secondary | ICD-10-CM | POA: Diagnosis not present

## 2014-09-08 DIAGNOSIS — N2581 Secondary hyperparathyroidism of renal origin: Secondary | ICD-10-CM | POA: Diagnosis not present

## 2014-09-11 DIAGNOSIS — L299 Pruritus, unspecified: Secondary | ICD-10-CM | POA: Diagnosis not present

## 2014-09-11 DIAGNOSIS — D509 Iron deficiency anemia, unspecified: Secondary | ICD-10-CM | POA: Diagnosis not present

## 2014-09-11 DIAGNOSIS — D631 Anemia in chronic kidney disease: Secondary | ICD-10-CM | POA: Diagnosis not present

## 2014-09-11 DIAGNOSIS — N2581 Secondary hyperparathyroidism of renal origin: Secondary | ICD-10-CM | POA: Diagnosis not present

## 2014-09-11 DIAGNOSIS — N186 End stage renal disease: Secondary | ICD-10-CM | POA: Diagnosis not present

## 2014-09-13 DIAGNOSIS — N186 End stage renal disease: Secondary | ICD-10-CM | POA: Diagnosis not present

## 2014-09-13 DIAGNOSIS — N2581 Secondary hyperparathyroidism of renal origin: Secondary | ICD-10-CM | POA: Diagnosis not present

## 2014-09-13 DIAGNOSIS — D509 Iron deficiency anemia, unspecified: Secondary | ICD-10-CM | POA: Diagnosis not present

## 2014-09-13 DIAGNOSIS — L299 Pruritus, unspecified: Secondary | ICD-10-CM | POA: Diagnosis not present

## 2014-09-13 DIAGNOSIS — D631 Anemia in chronic kidney disease: Secondary | ICD-10-CM | POA: Diagnosis not present

## 2014-09-15 DIAGNOSIS — L299 Pruritus, unspecified: Secondary | ICD-10-CM | POA: Diagnosis not present

## 2014-09-15 DIAGNOSIS — N2581 Secondary hyperparathyroidism of renal origin: Secondary | ICD-10-CM | POA: Diagnosis not present

## 2014-09-15 DIAGNOSIS — D509 Iron deficiency anemia, unspecified: Secondary | ICD-10-CM | POA: Diagnosis not present

## 2014-09-15 DIAGNOSIS — N186 End stage renal disease: Secondary | ICD-10-CM | POA: Diagnosis not present

## 2014-09-15 DIAGNOSIS — D631 Anemia in chronic kidney disease: Secondary | ICD-10-CM | POA: Diagnosis not present

## 2014-09-17 DIAGNOSIS — A630 Anogenital (venereal) warts: Secondary | ICD-10-CM | POA: Diagnosis not present

## 2014-09-18 DIAGNOSIS — D509 Iron deficiency anemia, unspecified: Secondary | ICD-10-CM | POA: Diagnosis not present

## 2014-09-18 DIAGNOSIS — N2581 Secondary hyperparathyroidism of renal origin: Secondary | ICD-10-CM | POA: Diagnosis not present

## 2014-09-18 DIAGNOSIS — L299 Pruritus, unspecified: Secondary | ICD-10-CM | POA: Diagnosis not present

## 2014-09-18 DIAGNOSIS — N186 End stage renal disease: Secondary | ICD-10-CM | POA: Diagnosis not present

## 2014-09-18 DIAGNOSIS — D631 Anemia in chronic kidney disease: Secondary | ICD-10-CM | POA: Diagnosis not present

## 2014-09-20 DIAGNOSIS — D631 Anemia in chronic kidney disease: Secondary | ICD-10-CM | POA: Diagnosis not present

## 2014-09-20 DIAGNOSIS — L299 Pruritus, unspecified: Secondary | ICD-10-CM | POA: Diagnosis not present

## 2014-09-20 DIAGNOSIS — N186 End stage renal disease: Secondary | ICD-10-CM | POA: Diagnosis not present

## 2014-09-20 DIAGNOSIS — N2581 Secondary hyperparathyroidism of renal origin: Secondary | ICD-10-CM | POA: Diagnosis not present

## 2014-09-20 DIAGNOSIS — D509 Iron deficiency anemia, unspecified: Secondary | ICD-10-CM | POA: Diagnosis not present

## 2014-09-22 DIAGNOSIS — D509 Iron deficiency anemia, unspecified: Secondary | ICD-10-CM | POA: Diagnosis not present

## 2014-09-22 DIAGNOSIS — N186 End stage renal disease: Secondary | ICD-10-CM | POA: Diagnosis not present

## 2014-09-22 DIAGNOSIS — D631 Anemia in chronic kidney disease: Secondary | ICD-10-CM | POA: Diagnosis not present

## 2014-09-22 DIAGNOSIS — L299 Pruritus, unspecified: Secondary | ICD-10-CM | POA: Diagnosis not present

## 2014-09-22 DIAGNOSIS — N2581 Secondary hyperparathyroidism of renal origin: Secondary | ICD-10-CM | POA: Diagnosis not present

## 2014-09-25 DIAGNOSIS — D631 Anemia in chronic kidney disease: Secondary | ICD-10-CM | POA: Diagnosis not present

## 2014-09-25 DIAGNOSIS — L299 Pruritus, unspecified: Secondary | ICD-10-CM | POA: Diagnosis not present

## 2014-09-25 DIAGNOSIS — N186 End stage renal disease: Secondary | ICD-10-CM | POA: Diagnosis not present

## 2014-09-25 DIAGNOSIS — N2581 Secondary hyperparathyroidism of renal origin: Secondary | ICD-10-CM | POA: Diagnosis not present

## 2014-09-25 DIAGNOSIS — D509 Iron deficiency anemia, unspecified: Secondary | ICD-10-CM | POA: Diagnosis not present

## 2014-09-27 DIAGNOSIS — D631 Anemia in chronic kidney disease: Secondary | ICD-10-CM | POA: Diagnosis not present

## 2014-09-27 DIAGNOSIS — N186 End stage renal disease: Secondary | ICD-10-CM | POA: Diagnosis not present

## 2014-09-27 DIAGNOSIS — N2581 Secondary hyperparathyroidism of renal origin: Secondary | ICD-10-CM | POA: Diagnosis not present

## 2014-09-27 DIAGNOSIS — L299 Pruritus, unspecified: Secondary | ICD-10-CM | POA: Diagnosis not present

## 2014-09-27 DIAGNOSIS — D509 Iron deficiency anemia, unspecified: Secondary | ICD-10-CM | POA: Diagnosis not present

## 2014-09-29 DIAGNOSIS — D509 Iron deficiency anemia, unspecified: Secondary | ICD-10-CM | POA: Diagnosis not present

## 2014-09-29 DIAGNOSIS — D631 Anemia in chronic kidney disease: Secondary | ICD-10-CM | POA: Diagnosis not present

## 2014-09-29 DIAGNOSIS — N186 End stage renal disease: Secondary | ICD-10-CM | POA: Diagnosis not present

## 2014-09-29 DIAGNOSIS — N2581 Secondary hyperparathyroidism of renal origin: Secondary | ICD-10-CM | POA: Diagnosis not present

## 2014-09-29 DIAGNOSIS — L299 Pruritus, unspecified: Secondary | ICD-10-CM | POA: Diagnosis not present

## 2014-10-02 DIAGNOSIS — D509 Iron deficiency anemia, unspecified: Secondary | ICD-10-CM | POA: Diagnosis not present

## 2014-10-02 DIAGNOSIS — D631 Anemia in chronic kidney disease: Secondary | ICD-10-CM | POA: Diagnosis not present

## 2014-10-02 DIAGNOSIS — L299 Pruritus, unspecified: Secondary | ICD-10-CM | POA: Diagnosis not present

## 2014-10-02 DIAGNOSIS — N2581 Secondary hyperparathyroidism of renal origin: Secondary | ICD-10-CM | POA: Diagnosis not present

## 2014-10-02 DIAGNOSIS — N186 End stage renal disease: Secondary | ICD-10-CM | POA: Diagnosis not present

## 2014-10-03 DIAGNOSIS — N186 End stage renal disease: Secondary | ICD-10-CM | POA: Diagnosis not present

## 2014-10-03 DIAGNOSIS — Z992 Dependence on renal dialysis: Secondary | ICD-10-CM | POA: Diagnosis not present

## 2014-10-04 DIAGNOSIS — L299 Pruritus, unspecified: Secondary | ICD-10-CM | POA: Diagnosis not present

## 2014-10-04 DIAGNOSIS — N186 End stage renal disease: Secondary | ICD-10-CM | POA: Diagnosis not present

## 2014-10-04 DIAGNOSIS — D631 Anemia in chronic kidney disease: Secondary | ICD-10-CM | POA: Diagnosis not present

## 2014-10-04 DIAGNOSIS — D509 Iron deficiency anemia, unspecified: Secondary | ICD-10-CM | POA: Diagnosis not present

## 2014-10-04 DIAGNOSIS — N2581 Secondary hyperparathyroidism of renal origin: Secondary | ICD-10-CM | POA: Diagnosis not present

## 2014-10-06 DIAGNOSIS — N186 End stage renal disease: Secondary | ICD-10-CM | POA: Diagnosis not present

## 2014-10-06 DIAGNOSIS — L299 Pruritus, unspecified: Secondary | ICD-10-CM | POA: Diagnosis not present

## 2014-10-06 DIAGNOSIS — D631 Anemia in chronic kidney disease: Secondary | ICD-10-CM | POA: Diagnosis not present

## 2014-10-06 DIAGNOSIS — N2581 Secondary hyperparathyroidism of renal origin: Secondary | ICD-10-CM | POA: Diagnosis not present

## 2014-10-06 DIAGNOSIS — D509 Iron deficiency anemia, unspecified: Secondary | ICD-10-CM | POA: Diagnosis not present

## 2014-10-09 DIAGNOSIS — N186 End stage renal disease: Secondary | ICD-10-CM | POA: Diagnosis not present

## 2014-10-09 DIAGNOSIS — N2581 Secondary hyperparathyroidism of renal origin: Secondary | ICD-10-CM | POA: Diagnosis not present

## 2014-10-09 DIAGNOSIS — L299 Pruritus, unspecified: Secondary | ICD-10-CM | POA: Diagnosis not present

## 2014-10-09 DIAGNOSIS — D631 Anemia in chronic kidney disease: Secondary | ICD-10-CM | POA: Diagnosis not present

## 2014-10-09 DIAGNOSIS — D509 Iron deficiency anemia, unspecified: Secondary | ICD-10-CM | POA: Diagnosis not present

## 2014-10-11 DIAGNOSIS — D509 Iron deficiency anemia, unspecified: Secondary | ICD-10-CM | POA: Diagnosis not present

## 2014-10-11 DIAGNOSIS — D631 Anemia in chronic kidney disease: Secondary | ICD-10-CM | POA: Diagnosis not present

## 2014-10-11 DIAGNOSIS — L299 Pruritus, unspecified: Secondary | ICD-10-CM | POA: Diagnosis not present

## 2014-10-11 DIAGNOSIS — N2581 Secondary hyperparathyroidism of renal origin: Secondary | ICD-10-CM | POA: Diagnosis not present

## 2014-10-11 DIAGNOSIS — N186 End stage renal disease: Secondary | ICD-10-CM | POA: Diagnosis not present

## 2014-10-12 DIAGNOSIS — A630 Anogenital (venereal) warts: Secondary | ICD-10-CM | POA: Diagnosis not present

## 2014-10-13 DIAGNOSIS — L299 Pruritus, unspecified: Secondary | ICD-10-CM | POA: Diagnosis not present

## 2014-10-13 DIAGNOSIS — N2581 Secondary hyperparathyroidism of renal origin: Secondary | ICD-10-CM | POA: Diagnosis not present

## 2014-10-13 DIAGNOSIS — N186 End stage renal disease: Secondary | ICD-10-CM | POA: Diagnosis not present

## 2014-10-13 DIAGNOSIS — D509 Iron deficiency anemia, unspecified: Secondary | ICD-10-CM | POA: Diagnosis not present

## 2014-10-13 DIAGNOSIS — D631 Anemia in chronic kidney disease: Secondary | ICD-10-CM | POA: Diagnosis not present

## 2014-10-16 DIAGNOSIS — N2581 Secondary hyperparathyroidism of renal origin: Secondary | ICD-10-CM | POA: Diagnosis not present

## 2014-10-16 DIAGNOSIS — D631 Anemia in chronic kidney disease: Secondary | ICD-10-CM | POA: Diagnosis not present

## 2014-10-16 DIAGNOSIS — L299 Pruritus, unspecified: Secondary | ICD-10-CM | POA: Diagnosis not present

## 2014-10-16 DIAGNOSIS — N186 End stage renal disease: Secondary | ICD-10-CM | POA: Diagnosis not present

## 2014-10-16 DIAGNOSIS — D509 Iron deficiency anemia, unspecified: Secondary | ICD-10-CM | POA: Diagnosis not present

## 2014-10-18 DIAGNOSIS — D509 Iron deficiency anemia, unspecified: Secondary | ICD-10-CM | POA: Diagnosis not present

## 2014-10-18 DIAGNOSIS — D631 Anemia in chronic kidney disease: Secondary | ICD-10-CM | POA: Diagnosis not present

## 2014-10-18 DIAGNOSIS — L299 Pruritus, unspecified: Secondary | ICD-10-CM | POA: Diagnosis not present

## 2014-10-18 DIAGNOSIS — N186 End stage renal disease: Secondary | ICD-10-CM | POA: Diagnosis not present

## 2014-10-18 DIAGNOSIS — N2581 Secondary hyperparathyroidism of renal origin: Secondary | ICD-10-CM | POA: Diagnosis not present

## 2014-10-20 DIAGNOSIS — D509 Iron deficiency anemia, unspecified: Secondary | ICD-10-CM | POA: Diagnosis not present

## 2014-10-20 DIAGNOSIS — D631 Anemia in chronic kidney disease: Secondary | ICD-10-CM | POA: Diagnosis not present

## 2014-10-20 DIAGNOSIS — N186 End stage renal disease: Secondary | ICD-10-CM | POA: Diagnosis not present

## 2014-10-20 DIAGNOSIS — L299 Pruritus, unspecified: Secondary | ICD-10-CM | POA: Diagnosis not present

## 2014-10-20 DIAGNOSIS — N2581 Secondary hyperparathyroidism of renal origin: Secondary | ICD-10-CM | POA: Diagnosis not present

## 2014-10-23 DIAGNOSIS — N2581 Secondary hyperparathyroidism of renal origin: Secondary | ICD-10-CM | POA: Diagnosis not present

## 2014-10-23 DIAGNOSIS — L299 Pruritus, unspecified: Secondary | ICD-10-CM | POA: Diagnosis not present

## 2014-10-23 DIAGNOSIS — D631 Anemia in chronic kidney disease: Secondary | ICD-10-CM | POA: Diagnosis not present

## 2014-10-23 DIAGNOSIS — D509 Iron deficiency anemia, unspecified: Secondary | ICD-10-CM | POA: Diagnosis not present

## 2014-10-23 DIAGNOSIS — N186 End stage renal disease: Secondary | ICD-10-CM | POA: Diagnosis not present

## 2014-10-24 DIAGNOSIS — R87612 Low grade squamous intraepithelial lesion on cytologic smear of cervix (LGSIL): Secondary | ICD-10-CM | POA: Diagnosis not present

## 2014-10-24 DIAGNOSIS — N870 Mild cervical dysplasia: Secondary | ICD-10-CM | POA: Diagnosis not present

## 2014-10-25 DIAGNOSIS — L299 Pruritus, unspecified: Secondary | ICD-10-CM | POA: Diagnosis not present

## 2014-10-25 DIAGNOSIS — N186 End stage renal disease: Secondary | ICD-10-CM | POA: Diagnosis not present

## 2014-10-25 DIAGNOSIS — N2581 Secondary hyperparathyroidism of renal origin: Secondary | ICD-10-CM | POA: Diagnosis not present

## 2014-10-25 DIAGNOSIS — D509 Iron deficiency anemia, unspecified: Secondary | ICD-10-CM | POA: Diagnosis not present

## 2014-10-25 DIAGNOSIS — D631 Anemia in chronic kidney disease: Secondary | ICD-10-CM | POA: Diagnosis not present

## 2014-10-27 DIAGNOSIS — L299 Pruritus, unspecified: Secondary | ICD-10-CM | POA: Diagnosis not present

## 2014-10-27 DIAGNOSIS — N2581 Secondary hyperparathyroidism of renal origin: Secondary | ICD-10-CM | POA: Diagnosis not present

## 2014-10-27 DIAGNOSIS — D509 Iron deficiency anemia, unspecified: Secondary | ICD-10-CM | POA: Diagnosis not present

## 2014-10-27 DIAGNOSIS — N186 End stage renal disease: Secondary | ICD-10-CM | POA: Diagnosis not present

## 2014-10-27 DIAGNOSIS — D631 Anemia in chronic kidney disease: Secondary | ICD-10-CM | POA: Diagnosis not present

## 2014-10-30 DIAGNOSIS — N2581 Secondary hyperparathyroidism of renal origin: Secondary | ICD-10-CM | POA: Diagnosis not present

## 2014-10-30 DIAGNOSIS — L299 Pruritus, unspecified: Secondary | ICD-10-CM | POA: Diagnosis not present

## 2014-10-30 DIAGNOSIS — D631 Anemia in chronic kidney disease: Secondary | ICD-10-CM | POA: Diagnosis not present

## 2014-10-30 DIAGNOSIS — N186 End stage renal disease: Secondary | ICD-10-CM | POA: Diagnosis not present

## 2014-10-30 DIAGNOSIS — D509 Iron deficiency anemia, unspecified: Secondary | ICD-10-CM | POA: Diagnosis not present

## 2014-11-01 DIAGNOSIS — N2581 Secondary hyperparathyroidism of renal origin: Secondary | ICD-10-CM | POA: Diagnosis not present

## 2014-11-01 DIAGNOSIS — D509 Iron deficiency anemia, unspecified: Secondary | ICD-10-CM | POA: Diagnosis not present

## 2014-11-01 DIAGNOSIS — L299 Pruritus, unspecified: Secondary | ICD-10-CM | POA: Diagnosis not present

## 2014-11-01 DIAGNOSIS — N186 End stage renal disease: Secondary | ICD-10-CM | POA: Diagnosis not present

## 2014-11-01 DIAGNOSIS — D631 Anemia in chronic kidney disease: Secondary | ICD-10-CM | POA: Diagnosis not present

## 2014-11-02 DIAGNOSIS — Z992 Dependence on renal dialysis: Secondary | ICD-10-CM | POA: Diagnosis not present

## 2014-11-02 DIAGNOSIS — N186 End stage renal disease: Secondary | ICD-10-CM | POA: Diagnosis not present

## 2014-11-03 DIAGNOSIS — E611 Iron deficiency: Secondary | ICD-10-CM | POA: Diagnosis not present

## 2014-11-03 DIAGNOSIS — D509 Iron deficiency anemia, unspecified: Secondary | ICD-10-CM | POA: Diagnosis not present

## 2014-11-03 DIAGNOSIS — N2581 Secondary hyperparathyroidism of renal origin: Secondary | ICD-10-CM | POA: Diagnosis not present

## 2014-11-03 DIAGNOSIS — N186 End stage renal disease: Secondary | ICD-10-CM | POA: Diagnosis not present

## 2014-11-03 DIAGNOSIS — D631 Anemia in chronic kidney disease: Secondary | ICD-10-CM | POA: Diagnosis not present

## 2014-11-03 DIAGNOSIS — L299 Pruritus, unspecified: Secondary | ICD-10-CM | POA: Diagnosis not present

## 2014-11-06 DIAGNOSIS — N2581 Secondary hyperparathyroidism of renal origin: Secondary | ICD-10-CM | POA: Diagnosis not present

## 2014-11-06 DIAGNOSIS — D631 Anemia in chronic kidney disease: Secondary | ICD-10-CM | POA: Diagnosis not present

## 2014-11-06 DIAGNOSIS — L299 Pruritus, unspecified: Secondary | ICD-10-CM | POA: Diagnosis not present

## 2014-11-06 DIAGNOSIS — N186 End stage renal disease: Secondary | ICD-10-CM | POA: Diagnosis not present

## 2014-11-06 DIAGNOSIS — E611 Iron deficiency: Secondary | ICD-10-CM | POA: Diagnosis not present

## 2014-11-06 DIAGNOSIS — D509 Iron deficiency anemia, unspecified: Secondary | ICD-10-CM | POA: Diagnosis not present

## 2014-11-08 DIAGNOSIS — D631 Anemia in chronic kidney disease: Secondary | ICD-10-CM | POA: Diagnosis not present

## 2014-11-08 DIAGNOSIS — E611 Iron deficiency: Secondary | ICD-10-CM | POA: Diagnosis not present

## 2014-11-08 DIAGNOSIS — D509 Iron deficiency anemia, unspecified: Secondary | ICD-10-CM | POA: Diagnosis not present

## 2014-11-08 DIAGNOSIS — N186 End stage renal disease: Secondary | ICD-10-CM | POA: Diagnosis not present

## 2014-11-08 DIAGNOSIS — L299 Pruritus, unspecified: Secondary | ICD-10-CM | POA: Diagnosis not present

## 2014-11-08 DIAGNOSIS — N2581 Secondary hyperparathyroidism of renal origin: Secondary | ICD-10-CM | POA: Diagnosis not present

## 2014-11-10 DIAGNOSIS — E611 Iron deficiency: Secondary | ICD-10-CM | POA: Diagnosis not present

## 2014-11-10 DIAGNOSIS — L299 Pruritus, unspecified: Secondary | ICD-10-CM | POA: Diagnosis not present

## 2014-11-10 DIAGNOSIS — N2581 Secondary hyperparathyroidism of renal origin: Secondary | ICD-10-CM | POA: Diagnosis not present

## 2014-11-10 DIAGNOSIS — D631 Anemia in chronic kidney disease: Secondary | ICD-10-CM | POA: Diagnosis not present

## 2014-11-10 DIAGNOSIS — N186 End stage renal disease: Secondary | ICD-10-CM | POA: Diagnosis not present

## 2014-11-10 DIAGNOSIS — D509 Iron deficiency anemia, unspecified: Secondary | ICD-10-CM | POA: Diagnosis not present

## 2014-11-13 DIAGNOSIS — L299 Pruritus, unspecified: Secondary | ICD-10-CM | POA: Diagnosis not present

## 2014-11-13 DIAGNOSIS — E611 Iron deficiency: Secondary | ICD-10-CM | POA: Diagnosis not present

## 2014-11-13 DIAGNOSIS — N186 End stage renal disease: Secondary | ICD-10-CM | POA: Diagnosis not present

## 2014-11-13 DIAGNOSIS — N2581 Secondary hyperparathyroidism of renal origin: Secondary | ICD-10-CM | POA: Diagnosis not present

## 2014-11-13 DIAGNOSIS — D509 Iron deficiency anemia, unspecified: Secondary | ICD-10-CM | POA: Diagnosis not present

## 2014-11-13 DIAGNOSIS — D631 Anemia in chronic kidney disease: Secondary | ICD-10-CM | POA: Diagnosis not present

## 2014-11-15 DIAGNOSIS — D631 Anemia in chronic kidney disease: Secondary | ICD-10-CM | POA: Diagnosis not present

## 2014-11-15 DIAGNOSIS — L299 Pruritus, unspecified: Secondary | ICD-10-CM | POA: Diagnosis not present

## 2014-11-15 DIAGNOSIS — D509 Iron deficiency anemia, unspecified: Secondary | ICD-10-CM | POA: Diagnosis not present

## 2014-11-15 DIAGNOSIS — N186 End stage renal disease: Secondary | ICD-10-CM | POA: Diagnosis not present

## 2014-11-15 DIAGNOSIS — N2581 Secondary hyperparathyroidism of renal origin: Secondary | ICD-10-CM | POA: Diagnosis not present

## 2014-11-15 DIAGNOSIS — E611 Iron deficiency: Secondary | ICD-10-CM | POA: Diagnosis not present

## 2014-11-17 DIAGNOSIS — N186 End stage renal disease: Secondary | ICD-10-CM | POA: Diagnosis not present

## 2014-11-17 DIAGNOSIS — D631 Anemia in chronic kidney disease: Secondary | ICD-10-CM | POA: Diagnosis not present

## 2014-11-17 DIAGNOSIS — E611 Iron deficiency: Secondary | ICD-10-CM | POA: Diagnosis not present

## 2014-11-17 DIAGNOSIS — D509 Iron deficiency anemia, unspecified: Secondary | ICD-10-CM | POA: Diagnosis not present

## 2014-11-17 DIAGNOSIS — N2581 Secondary hyperparathyroidism of renal origin: Secondary | ICD-10-CM | POA: Diagnosis not present

## 2014-11-17 DIAGNOSIS — L299 Pruritus, unspecified: Secondary | ICD-10-CM | POA: Diagnosis not present

## 2014-11-20 DIAGNOSIS — D509 Iron deficiency anemia, unspecified: Secondary | ICD-10-CM | POA: Diagnosis not present

## 2014-11-20 DIAGNOSIS — L299 Pruritus, unspecified: Secondary | ICD-10-CM | POA: Diagnosis not present

## 2014-11-20 DIAGNOSIS — E611 Iron deficiency: Secondary | ICD-10-CM | POA: Diagnosis not present

## 2014-11-20 DIAGNOSIS — N2581 Secondary hyperparathyroidism of renal origin: Secondary | ICD-10-CM | POA: Diagnosis not present

## 2014-11-20 DIAGNOSIS — D631 Anemia in chronic kidney disease: Secondary | ICD-10-CM | POA: Diagnosis not present

## 2014-11-20 DIAGNOSIS — N186 End stage renal disease: Secondary | ICD-10-CM | POA: Diagnosis not present

## 2014-11-22 DIAGNOSIS — D631 Anemia in chronic kidney disease: Secondary | ICD-10-CM | POA: Diagnosis not present

## 2014-11-22 DIAGNOSIS — N2581 Secondary hyperparathyroidism of renal origin: Secondary | ICD-10-CM | POA: Diagnosis not present

## 2014-11-22 DIAGNOSIS — E611 Iron deficiency: Secondary | ICD-10-CM | POA: Diagnosis not present

## 2014-11-22 DIAGNOSIS — N186 End stage renal disease: Secondary | ICD-10-CM | POA: Diagnosis not present

## 2014-11-22 DIAGNOSIS — D509 Iron deficiency anemia, unspecified: Secondary | ICD-10-CM | POA: Diagnosis not present

## 2014-11-22 DIAGNOSIS — L299 Pruritus, unspecified: Secondary | ICD-10-CM | POA: Diagnosis not present

## 2014-11-24 DIAGNOSIS — E611 Iron deficiency: Secondary | ICD-10-CM | POA: Diagnosis not present

## 2014-11-24 DIAGNOSIS — N186 End stage renal disease: Secondary | ICD-10-CM | POA: Diagnosis not present

## 2014-11-24 DIAGNOSIS — D509 Iron deficiency anemia, unspecified: Secondary | ICD-10-CM | POA: Diagnosis not present

## 2014-11-24 DIAGNOSIS — D631 Anemia in chronic kidney disease: Secondary | ICD-10-CM | POA: Diagnosis not present

## 2014-11-24 DIAGNOSIS — L299 Pruritus, unspecified: Secondary | ICD-10-CM | POA: Diagnosis not present

## 2014-11-24 DIAGNOSIS — N2581 Secondary hyperparathyroidism of renal origin: Secondary | ICD-10-CM | POA: Diagnosis not present

## 2014-11-27 DIAGNOSIS — N186 End stage renal disease: Secondary | ICD-10-CM | POA: Diagnosis not present

## 2014-11-27 DIAGNOSIS — D509 Iron deficiency anemia, unspecified: Secondary | ICD-10-CM | POA: Diagnosis not present

## 2014-11-27 DIAGNOSIS — N2581 Secondary hyperparathyroidism of renal origin: Secondary | ICD-10-CM | POA: Diagnosis not present

## 2014-11-27 DIAGNOSIS — E611 Iron deficiency: Secondary | ICD-10-CM | POA: Diagnosis not present

## 2014-11-27 DIAGNOSIS — D631 Anemia in chronic kidney disease: Secondary | ICD-10-CM | POA: Diagnosis not present

## 2014-11-27 DIAGNOSIS — L299 Pruritus, unspecified: Secondary | ICD-10-CM | POA: Diagnosis not present

## 2014-11-29 DIAGNOSIS — N2581 Secondary hyperparathyroidism of renal origin: Secondary | ICD-10-CM | POA: Diagnosis not present

## 2014-11-29 DIAGNOSIS — E611 Iron deficiency: Secondary | ICD-10-CM | POA: Diagnosis not present

## 2014-11-29 DIAGNOSIS — D509 Iron deficiency anemia, unspecified: Secondary | ICD-10-CM | POA: Diagnosis not present

## 2014-11-29 DIAGNOSIS — D631 Anemia in chronic kidney disease: Secondary | ICD-10-CM | POA: Diagnosis not present

## 2014-11-29 DIAGNOSIS — L299 Pruritus, unspecified: Secondary | ICD-10-CM | POA: Diagnosis not present

## 2014-11-29 DIAGNOSIS — N186 End stage renal disease: Secondary | ICD-10-CM | POA: Diagnosis not present

## 2014-12-01 DIAGNOSIS — L299 Pruritus, unspecified: Secondary | ICD-10-CM | POA: Diagnosis not present

## 2014-12-01 DIAGNOSIS — D631 Anemia in chronic kidney disease: Secondary | ICD-10-CM | POA: Diagnosis not present

## 2014-12-01 DIAGNOSIS — D509 Iron deficiency anemia, unspecified: Secondary | ICD-10-CM | POA: Diagnosis not present

## 2014-12-01 DIAGNOSIS — N186 End stage renal disease: Secondary | ICD-10-CM | POA: Diagnosis not present

## 2014-12-01 DIAGNOSIS — N2581 Secondary hyperparathyroidism of renal origin: Secondary | ICD-10-CM | POA: Diagnosis not present

## 2014-12-01 DIAGNOSIS — E611 Iron deficiency: Secondary | ICD-10-CM | POA: Diagnosis not present

## 2014-12-03 DIAGNOSIS — N186 End stage renal disease: Secondary | ICD-10-CM | POA: Diagnosis not present

## 2014-12-03 DIAGNOSIS — A630 Anogenital (venereal) warts: Secondary | ICD-10-CM | POA: Diagnosis not present

## 2014-12-03 DIAGNOSIS — Z940 Kidney transplant status: Secondary | ICD-10-CM | POA: Diagnosis not present

## 2014-12-03 DIAGNOSIS — I953 Hypotension of hemodialysis: Secondary | ICD-10-CM | POA: Diagnosis not present

## 2014-12-03 DIAGNOSIS — I120 Hypertensive chronic kidney disease with stage 5 chronic kidney disease or end stage renal disease: Secondary | ICD-10-CM | POA: Diagnosis not present

## 2014-12-03 DIAGNOSIS — Z992 Dependence on renal dialysis: Secondary | ICD-10-CM | POA: Diagnosis not present

## 2014-12-03 DIAGNOSIS — J984 Other disorders of lung: Secondary | ICD-10-CM | POA: Diagnosis not present

## 2014-12-04 DIAGNOSIS — L299 Pruritus, unspecified: Secondary | ICD-10-CM | POA: Diagnosis not present

## 2014-12-04 DIAGNOSIS — N2581 Secondary hyperparathyroidism of renal origin: Secondary | ICD-10-CM | POA: Diagnosis not present

## 2014-12-04 DIAGNOSIS — D631 Anemia in chronic kidney disease: Secondary | ICD-10-CM | POA: Diagnosis not present

## 2014-12-04 DIAGNOSIS — N186 End stage renal disease: Secondary | ICD-10-CM | POA: Diagnosis not present

## 2014-12-04 DIAGNOSIS — Z409 Encounter for prophylactic surgery, unspecified: Secondary | ICD-10-CM | POA: Diagnosis not present

## 2014-12-04 DIAGNOSIS — D509 Iron deficiency anemia, unspecified: Secondary | ICD-10-CM | POA: Diagnosis not present

## 2014-12-06 DIAGNOSIS — Z409 Encounter for prophylactic surgery, unspecified: Secondary | ICD-10-CM | POA: Diagnosis not present

## 2014-12-06 DIAGNOSIS — D509 Iron deficiency anemia, unspecified: Secondary | ICD-10-CM | POA: Diagnosis not present

## 2014-12-06 DIAGNOSIS — D631 Anemia in chronic kidney disease: Secondary | ICD-10-CM | POA: Diagnosis not present

## 2014-12-06 DIAGNOSIS — N186 End stage renal disease: Secondary | ICD-10-CM | POA: Diagnosis not present

## 2014-12-06 DIAGNOSIS — L299 Pruritus, unspecified: Secondary | ICD-10-CM | POA: Diagnosis not present

## 2014-12-06 DIAGNOSIS — N2581 Secondary hyperparathyroidism of renal origin: Secondary | ICD-10-CM | POA: Diagnosis not present

## 2014-12-08 DIAGNOSIS — D631 Anemia in chronic kidney disease: Secondary | ICD-10-CM | POA: Diagnosis not present

## 2014-12-08 DIAGNOSIS — L299 Pruritus, unspecified: Secondary | ICD-10-CM | POA: Diagnosis not present

## 2014-12-08 DIAGNOSIS — D509 Iron deficiency anemia, unspecified: Secondary | ICD-10-CM | POA: Diagnosis not present

## 2014-12-08 DIAGNOSIS — Z409 Encounter for prophylactic surgery, unspecified: Secondary | ICD-10-CM | POA: Diagnosis not present

## 2014-12-08 DIAGNOSIS — N2581 Secondary hyperparathyroidism of renal origin: Secondary | ICD-10-CM | POA: Diagnosis not present

## 2014-12-08 DIAGNOSIS — N186 End stage renal disease: Secondary | ICD-10-CM | POA: Diagnosis not present

## 2014-12-10 DIAGNOSIS — Z409 Encounter for prophylactic surgery, unspecified: Secondary | ICD-10-CM | POA: Diagnosis not present

## 2014-12-10 DIAGNOSIS — N2581 Secondary hyperparathyroidism of renal origin: Secondary | ICD-10-CM | POA: Diagnosis not present

## 2014-12-10 DIAGNOSIS — L299 Pruritus, unspecified: Secondary | ICD-10-CM | POA: Diagnosis not present

## 2014-12-10 DIAGNOSIS — D631 Anemia in chronic kidney disease: Secondary | ICD-10-CM | POA: Diagnosis not present

## 2014-12-10 DIAGNOSIS — D509 Iron deficiency anemia, unspecified: Secondary | ICD-10-CM | POA: Diagnosis not present

## 2014-12-10 DIAGNOSIS — N186 End stage renal disease: Secondary | ICD-10-CM | POA: Diagnosis not present

## 2014-12-11 DIAGNOSIS — F17210 Nicotine dependence, cigarettes, uncomplicated: Secondary | ICD-10-CM | POA: Diagnosis not present

## 2014-12-11 DIAGNOSIS — Z992 Dependence on renal dialysis: Secondary | ICD-10-CM | POA: Diagnosis not present

## 2014-12-11 DIAGNOSIS — N186 End stage renal disease: Secondary | ICD-10-CM | POA: Diagnosis not present

## 2014-12-11 DIAGNOSIS — A630 Anogenital (venereal) warts: Secondary | ICD-10-CM | POA: Diagnosis not present

## 2014-12-11 DIAGNOSIS — I120 Hypertensive chronic kidney disease with stage 5 chronic kidney disease or end stage renal disease: Secondary | ICD-10-CM | POA: Diagnosis not present

## 2014-12-11 DIAGNOSIS — A5131 Condyloma latum: Secondary | ICD-10-CM | POA: Diagnosis not present

## 2014-12-11 DIAGNOSIS — T8612 Kidney transplant failure: Secondary | ICD-10-CM | POA: Diagnosis not present

## 2014-12-13 DIAGNOSIS — L299 Pruritus, unspecified: Secondary | ICD-10-CM | POA: Diagnosis not present

## 2014-12-13 DIAGNOSIS — Z409 Encounter for prophylactic surgery, unspecified: Secondary | ICD-10-CM | POA: Diagnosis not present

## 2014-12-13 DIAGNOSIS — N186 End stage renal disease: Secondary | ICD-10-CM | POA: Diagnosis not present

## 2014-12-13 DIAGNOSIS — D631 Anemia in chronic kidney disease: Secondary | ICD-10-CM | POA: Diagnosis not present

## 2014-12-13 DIAGNOSIS — D509 Iron deficiency anemia, unspecified: Secondary | ICD-10-CM | POA: Diagnosis not present

## 2014-12-13 DIAGNOSIS — N2581 Secondary hyperparathyroidism of renal origin: Secondary | ICD-10-CM | POA: Diagnosis not present

## 2014-12-15 DIAGNOSIS — D509 Iron deficiency anemia, unspecified: Secondary | ICD-10-CM | POA: Diagnosis not present

## 2014-12-15 DIAGNOSIS — Z409 Encounter for prophylactic surgery, unspecified: Secondary | ICD-10-CM | POA: Diagnosis not present

## 2014-12-15 DIAGNOSIS — N2581 Secondary hyperparathyroidism of renal origin: Secondary | ICD-10-CM | POA: Diagnosis not present

## 2014-12-15 DIAGNOSIS — N186 End stage renal disease: Secondary | ICD-10-CM | POA: Diagnosis not present

## 2014-12-15 DIAGNOSIS — D631 Anemia in chronic kidney disease: Secondary | ICD-10-CM | POA: Diagnosis not present

## 2014-12-15 DIAGNOSIS — L299 Pruritus, unspecified: Secondary | ICD-10-CM | POA: Diagnosis not present

## 2014-12-18 DIAGNOSIS — Z409 Encounter for prophylactic surgery, unspecified: Secondary | ICD-10-CM | POA: Diagnosis not present

## 2014-12-18 DIAGNOSIS — N2581 Secondary hyperparathyroidism of renal origin: Secondary | ICD-10-CM | POA: Diagnosis not present

## 2014-12-18 DIAGNOSIS — D509 Iron deficiency anemia, unspecified: Secondary | ICD-10-CM | POA: Diagnosis not present

## 2014-12-18 DIAGNOSIS — D631 Anemia in chronic kidney disease: Secondary | ICD-10-CM | POA: Diagnosis not present

## 2014-12-18 DIAGNOSIS — N186 End stage renal disease: Secondary | ICD-10-CM | POA: Diagnosis not present

## 2014-12-18 DIAGNOSIS — L299 Pruritus, unspecified: Secondary | ICD-10-CM | POA: Diagnosis not present

## 2014-12-20 DIAGNOSIS — Z409 Encounter for prophylactic surgery, unspecified: Secondary | ICD-10-CM | POA: Diagnosis not present

## 2014-12-20 DIAGNOSIS — N2581 Secondary hyperparathyroidism of renal origin: Secondary | ICD-10-CM | POA: Diagnosis not present

## 2014-12-20 DIAGNOSIS — D509 Iron deficiency anemia, unspecified: Secondary | ICD-10-CM | POA: Diagnosis not present

## 2014-12-20 DIAGNOSIS — L299 Pruritus, unspecified: Secondary | ICD-10-CM | POA: Diagnosis not present

## 2014-12-20 DIAGNOSIS — D631 Anemia in chronic kidney disease: Secondary | ICD-10-CM | POA: Diagnosis not present

## 2014-12-20 DIAGNOSIS — N186 End stage renal disease: Secondary | ICD-10-CM | POA: Diagnosis not present

## 2014-12-22 DIAGNOSIS — Z409 Encounter for prophylactic surgery, unspecified: Secondary | ICD-10-CM | POA: Diagnosis not present

## 2014-12-22 DIAGNOSIS — N2581 Secondary hyperparathyroidism of renal origin: Secondary | ICD-10-CM | POA: Diagnosis not present

## 2014-12-22 DIAGNOSIS — D631 Anemia in chronic kidney disease: Secondary | ICD-10-CM | POA: Diagnosis not present

## 2014-12-22 DIAGNOSIS — N186 End stage renal disease: Secondary | ICD-10-CM | POA: Diagnosis not present

## 2014-12-22 DIAGNOSIS — D509 Iron deficiency anemia, unspecified: Secondary | ICD-10-CM | POA: Diagnosis not present

## 2014-12-22 DIAGNOSIS — L299 Pruritus, unspecified: Secondary | ICD-10-CM | POA: Diagnosis not present

## 2014-12-25 DIAGNOSIS — N2581 Secondary hyperparathyroidism of renal origin: Secondary | ICD-10-CM | POA: Diagnosis not present

## 2014-12-25 DIAGNOSIS — N186 End stage renal disease: Secondary | ICD-10-CM | POA: Diagnosis not present

## 2014-12-25 DIAGNOSIS — Z409 Encounter for prophylactic surgery, unspecified: Secondary | ICD-10-CM | POA: Diagnosis not present

## 2014-12-25 DIAGNOSIS — D509 Iron deficiency anemia, unspecified: Secondary | ICD-10-CM | POA: Diagnosis not present

## 2014-12-25 DIAGNOSIS — L299 Pruritus, unspecified: Secondary | ICD-10-CM | POA: Diagnosis not present

## 2014-12-25 DIAGNOSIS — D631 Anemia in chronic kidney disease: Secondary | ICD-10-CM | POA: Diagnosis not present

## 2014-12-27 DIAGNOSIS — L299 Pruritus, unspecified: Secondary | ICD-10-CM | POA: Diagnosis not present

## 2014-12-27 DIAGNOSIS — Z409 Encounter for prophylactic surgery, unspecified: Secondary | ICD-10-CM | POA: Diagnosis not present

## 2014-12-27 DIAGNOSIS — D631 Anemia in chronic kidney disease: Secondary | ICD-10-CM | POA: Diagnosis not present

## 2014-12-27 DIAGNOSIS — N186 End stage renal disease: Secondary | ICD-10-CM | POA: Diagnosis not present

## 2014-12-27 DIAGNOSIS — D509 Iron deficiency anemia, unspecified: Secondary | ICD-10-CM | POA: Diagnosis not present

## 2014-12-27 DIAGNOSIS — N2581 Secondary hyperparathyroidism of renal origin: Secondary | ICD-10-CM | POA: Diagnosis not present

## 2014-12-29 DIAGNOSIS — L299 Pruritus, unspecified: Secondary | ICD-10-CM | POA: Diagnosis not present

## 2014-12-29 DIAGNOSIS — N186 End stage renal disease: Secondary | ICD-10-CM | POA: Diagnosis not present

## 2014-12-29 DIAGNOSIS — D509 Iron deficiency anemia, unspecified: Secondary | ICD-10-CM | POA: Diagnosis not present

## 2014-12-29 DIAGNOSIS — N2581 Secondary hyperparathyroidism of renal origin: Secondary | ICD-10-CM | POA: Diagnosis not present

## 2014-12-29 DIAGNOSIS — D631 Anemia in chronic kidney disease: Secondary | ICD-10-CM | POA: Diagnosis not present

## 2014-12-29 DIAGNOSIS — Z409 Encounter for prophylactic surgery, unspecified: Secondary | ICD-10-CM | POA: Diagnosis not present

## 2015-01-01 DIAGNOSIS — N2581 Secondary hyperparathyroidism of renal origin: Secondary | ICD-10-CM | POA: Diagnosis not present

## 2015-01-01 DIAGNOSIS — D631 Anemia in chronic kidney disease: Secondary | ICD-10-CM | POA: Diagnosis not present

## 2015-01-01 DIAGNOSIS — L299 Pruritus, unspecified: Secondary | ICD-10-CM | POA: Diagnosis not present

## 2015-01-01 DIAGNOSIS — Z409 Encounter for prophylactic surgery, unspecified: Secondary | ICD-10-CM | POA: Diagnosis not present

## 2015-01-01 DIAGNOSIS — D509 Iron deficiency anemia, unspecified: Secondary | ICD-10-CM | POA: Diagnosis not present

## 2015-01-01 DIAGNOSIS — N186 End stage renal disease: Secondary | ICD-10-CM | POA: Diagnosis not present

## 2015-01-02 DIAGNOSIS — I120 Hypertensive chronic kidney disease with stage 5 chronic kidney disease or end stage renal disease: Secondary | ICD-10-CM | POA: Diagnosis not present

## 2015-01-02 DIAGNOSIS — Z992 Dependence on renal dialysis: Secondary | ICD-10-CM | POA: Diagnosis not present

## 2015-01-02 DIAGNOSIS — N186 End stage renal disease: Secondary | ICD-10-CM | POA: Diagnosis not present

## 2015-01-03 DIAGNOSIS — D631 Anemia in chronic kidney disease: Secondary | ICD-10-CM | POA: Diagnosis not present

## 2015-01-03 DIAGNOSIS — N186 End stage renal disease: Secondary | ICD-10-CM | POA: Diagnosis not present

## 2015-01-03 DIAGNOSIS — L299 Pruritus, unspecified: Secondary | ICD-10-CM | POA: Diagnosis not present

## 2015-01-03 DIAGNOSIS — N2581 Secondary hyperparathyroidism of renal origin: Secondary | ICD-10-CM | POA: Diagnosis not present

## 2015-01-03 DIAGNOSIS — D509 Iron deficiency anemia, unspecified: Secondary | ICD-10-CM | POA: Diagnosis not present

## 2015-01-05 DIAGNOSIS — D509 Iron deficiency anemia, unspecified: Secondary | ICD-10-CM | POA: Diagnosis not present

## 2015-01-05 DIAGNOSIS — N186 End stage renal disease: Secondary | ICD-10-CM | POA: Diagnosis not present

## 2015-01-05 DIAGNOSIS — L299 Pruritus, unspecified: Secondary | ICD-10-CM | POA: Diagnosis not present

## 2015-01-05 DIAGNOSIS — N2581 Secondary hyperparathyroidism of renal origin: Secondary | ICD-10-CM | POA: Diagnosis not present

## 2015-01-05 DIAGNOSIS — D631 Anemia in chronic kidney disease: Secondary | ICD-10-CM | POA: Diagnosis not present

## 2015-01-08 DIAGNOSIS — D509 Iron deficiency anemia, unspecified: Secondary | ICD-10-CM | POA: Diagnosis not present

## 2015-01-08 DIAGNOSIS — L299 Pruritus, unspecified: Secondary | ICD-10-CM | POA: Diagnosis not present

## 2015-01-08 DIAGNOSIS — N186 End stage renal disease: Secondary | ICD-10-CM | POA: Diagnosis not present

## 2015-01-08 DIAGNOSIS — N2581 Secondary hyperparathyroidism of renal origin: Secondary | ICD-10-CM | POA: Diagnosis not present

## 2015-01-08 DIAGNOSIS — D631 Anemia in chronic kidney disease: Secondary | ICD-10-CM | POA: Diagnosis not present

## 2015-01-10 DIAGNOSIS — D631 Anemia in chronic kidney disease: Secondary | ICD-10-CM | POA: Diagnosis not present

## 2015-01-10 DIAGNOSIS — L299 Pruritus, unspecified: Secondary | ICD-10-CM | POA: Diagnosis not present

## 2015-01-10 DIAGNOSIS — D509 Iron deficiency anemia, unspecified: Secondary | ICD-10-CM | POA: Diagnosis not present

## 2015-01-10 DIAGNOSIS — N186 End stage renal disease: Secondary | ICD-10-CM | POA: Diagnosis not present

## 2015-01-10 DIAGNOSIS — N2581 Secondary hyperparathyroidism of renal origin: Secondary | ICD-10-CM | POA: Diagnosis not present

## 2015-01-12 DIAGNOSIS — L299 Pruritus, unspecified: Secondary | ICD-10-CM | POA: Diagnosis not present

## 2015-01-12 DIAGNOSIS — D631 Anemia in chronic kidney disease: Secondary | ICD-10-CM | POA: Diagnosis not present

## 2015-01-12 DIAGNOSIS — D509 Iron deficiency anemia, unspecified: Secondary | ICD-10-CM | POA: Diagnosis not present

## 2015-01-12 DIAGNOSIS — N2581 Secondary hyperparathyroidism of renal origin: Secondary | ICD-10-CM | POA: Diagnosis not present

## 2015-01-12 DIAGNOSIS — N186 End stage renal disease: Secondary | ICD-10-CM | POA: Diagnosis not present

## 2015-01-15 DIAGNOSIS — D631 Anemia in chronic kidney disease: Secondary | ICD-10-CM | POA: Diagnosis not present

## 2015-01-15 DIAGNOSIS — N186 End stage renal disease: Secondary | ICD-10-CM | POA: Diagnosis not present

## 2015-01-15 DIAGNOSIS — L299 Pruritus, unspecified: Secondary | ICD-10-CM | POA: Diagnosis not present

## 2015-01-15 DIAGNOSIS — N2581 Secondary hyperparathyroidism of renal origin: Secondary | ICD-10-CM | POA: Diagnosis not present

## 2015-01-15 DIAGNOSIS — D509 Iron deficiency anemia, unspecified: Secondary | ICD-10-CM | POA: Diagnosis not present

## 2015-01-17 DIAGNOSIS — D509 Iron deficiency anemia, unspecified: Secondary | ICD-10-CM | POA: Diagnosis not present

## 2015-01-17 DIAGNOSIS — L299 Pruritus, unspecified: Secondary | ICD-10-CM | POA: Diagnosis not present

## 2015-01-17 DIAGNOSIS — N2581 Secondary hyperparathyroidism of renal origin: Secondary | ICD-10-CM | POA: Diagnosis not present

## 2015-01-17 DIAGNOSIS — N186 End stage renal disease: Secondary | ICD-10-CM | POA: Diagnosis not present

## 2015-01-17 DIAGNOSIS — D631 Anemia in chronic kidney disease: Secondary | ICD-10-CM | POA: Diagnosis not present

## 2015-01-19 DIAGNOSIS — L299 Pruritus, unspecified: Secondary | ICD-10-CM | POA: Diagnosis not present

## 2015-01-19 DIAGNOSIS — N186 End stage renal disease: Secondary | ICD-10-CM | POA: Diagnosis not present

## 2015-01-19 DIAGNOSIS — D631 Anemia in chronic kidney disease: Secondary | ICD-10-CM | POA: Diagnosis not present

## 2015-01-19 DIAGNOSIS — D509 Iron deficiency anemia, unspecified: Secondary | ICD-10-CM | POA: Diagnosis not present

## 2015-01-19 DIAGNOSIS — N2581 Secondary hyperparathyroidism of renal origin: Secondary | ICD-10-CM | POA: Diagnosis not present

## 2015-01-22 DIAGNOSIS — N186 End stage renal disease: Secondary | ICD-10-CM | POA: Diagnosis not present

## 2015-01-22 DIAGNOSIS — N2581 Secondary hyperparathyroidism of renal origin: Secondary | ICD-10-CM | POA: Diagnosis not present

## 2015-01-22 DIAGNOSIS — D509 Iron deficiency anemia, unspecified: Secondary | ICD-10-CM | POA: Diagnosis not present

## 2015-01-22 DIAGNOSIS — D631 Anemia in chronic kidney disease: Secondary | ICD-10-CM | POA: Diagnosis not present

## 2015-01-22 DIAGNOSIS — L299 Pruritus, unspecified: Secondary | ICD-10-CM | POA: Diagnosis not present

## 2015-01-24 DIAGNOSIS — D631 Anemia in chronic kidney disease: Secondary | ICD-10-CM | POA: Diagnosis not present

## 2015-01-24 DIAGNOSIS — L299 Pruritus, unspecified: Secondary | ICD-10-CM | POA: Diagnosis not present

## 2015-01-24 DIAGNOSIS — N186 End stage renal disease: Secondary | ICD-10-CM | POA: Diagnosis not present

## 2015-01-24 DIAGNOSIS — D509 Iron deficiency anemia, unspecified: Secondary | ICD-10-CM | POA: Diagnosis not present

## 2015-01-24 DIAGNOSIS — N2581 Secondary hyperparathyroidism of renal origin: Secondary | ICD-10-CM | POA: Diagnosis not present

## 2015-01-26 DIAGNOSIS — N186 End stage renal disease: Secondary | ICD-10-CM | POA: Diagnosis not present

## 2015-01-26 DIAGNOSIS — D631 Anemia in chronic kidney disease: Secondary | ICD-10-CM | POA: Diagnosis not present

## 2015-01-26 DIAGNOSIS — D509 Iron deficiency anemia, unspecified: Secondary | ICD-10-CM | POA: Diagnosis not present

## 2015-01-26 DIAGNOSIS — N2581 Secondary hyperparathyroidism of renal origin: Secondary | ICD-10-CM | POA: Diagnosis not present

## 2015-01-26 DIAGNOSIS — L299 Pruritus, unspecified: Secondary | ICD-10-CM | POA: Diagnosis not present

## 2015-01-29 DIAGNOSIS — D509 Iron deficiency anemia, unspecified: Secondary | ICD-10-CM | POA: Diagnosis not present

## 2015-01-29 DIAGNOSIS — D631 Anemia in chronic kidney disease: Secondary | ICD-10-CM | POA: Diagnosis not present

## 2015-01-29 DIAGNOSIS — L299 Pruritus, unspecified: Secondary | ICD-10-CM | POA: Diagnosis not present

## 2015-01-29 DIAGNOSIS — N2581 Secondary hyperparathyroidism of renal origin: Secondary | ICD-10-CM | POA: Diagnosis not present

## 2015-01-29 DIAGNOSIS — N186 End stage renal disease: Secondary | ICD-10-CM | POA: Diagnosis not present

## 2015-01-31 DIAGNOSIS — N2581 Secondary hyperparathyroidism of renal origin: Secondary | ICD-10-CM | POA: Diagnosis not present

## 2015-01-31 DIAGNOSIS — D509 Iron deficiency anemia, unspecified: Secondary | ICD-10-CM | POA: Diagnosis not present

## 2015-01-31 DIAGNOSIS — D631 Anemia in chronic kidney disease: Secondary | ICD-10-CM | POA: Diagnosis not present

## 2015-01-31 DIAGNOSIS — N186 End stage renal disease: Secondary | ICD-10-CM | POA: Diagnosis not present

## 2015-01-31 DIAGNOSIS — L299 Pruritus, unspecified: Secondary | ICD-10-CM | POA: Diagnosis not present

## 2015-02-02 DIAGNOSIS — D631 Anemia in chronic kidney disease: Secondary | ICD-10-CM | POA: Diagnosis not present

## 2015-02-02 DIAGNOSIS — D509 Iron deficiency anemia, unspecified: Secondary | ICD-10-CM | POA: Diagnosis not present

## 2015-02-02 DIAGNOSIS — I120 Hypertensive chronic kidney disease with stage 5 chronic kidney disease or end stage renal disease: Secondary | ICD-10-CM | POA: Diagnosis not present

## 2015-02-02 DIAGNOSIS — L299 Pruritus, unspecified: Secondary | ICD-10-CM | POA: Diagnosis not present

## 2015-02-02 DIAGNOSIS — N2581 Secondary hyperparathyroidism of renal origin: Secondary | ICD-10-CM | POA: Diagnosis not present

## 2015-02-02 DIAGNOSIS — Z992 Dependence on renal dialysis: Secondary | ICD-10-CM | POA: Diagnosis not present

## 2015-02-02 DIAGNOSIS — N186 End stage renal disease: Secondary | ICD-10-CM | POA: Diagnosis not present

## 2015-02-03 DIAGNOSIS — N186 End stage renal disease: Secondary | ICD-10-CM | POA: Diagnosis not present

## 2015-02-03 DIAGNOSIS — I120 Hypertensive chronic kidney disease with stage 5 chronic kidney disease or end stage renal disease: Secondary | ICD-10-CM | POA: Diagnosis not present

## 2015-02-03 DIAGNOSIS — Z992 Dependence on renal dialysis: Secondary | ICD-10-CM | POA: Diagnosis not present

## 2015-02-04 DIAGNOSIS — I120 Hypertensive chronic kidney disease with stage 5 chronic kidney disease or end stage renal disease: Secondary | ICD-10-CM | POA: Diagnosis not present

## 2015-02-04 DIAGNOSIS — N186 End stage renal disease: Secondary | ICD-10-CM | POA: Diagnosis not present

## 2015-02-04 DIAGNOSIS — Z992 Dependence on renal dialysis: Secondary | ICD-10-CM | POA: Diagnosis not present

## 2015-02-05 DIAGNOSIS — L299 Pruritus, unspecified: Secondary | ICD-10-CM | POA: Diagnosis not present

## 2015-02-05 DIAGNOSIS — D509 Iron deficiency anemia, unspecified: Secondary | ICD-10-CM | POA: Diagnosis not present

## 2015-02-05 DIAGNOSIS — N2581 Secondary hyperparathyroidism of renal origin: Secondary | ICD-10-CM | POA: Diagnosis not present

## 2015-02-05 DIAGNOSIS — N186 End stage renal disease: Secondary | ICD-10-CM | POA: Diagnosis not present

## 2015-02-05 DIAGNOSIS — Z992 Dependence on renal dialysis: Secondary | ICD-10-CM | POA: Diagnosis not present

## 2015-02-05 DIAGNOSIS — I120 Hypertensive chronic kidney disease with stage 5 chronic kidney disease or end stage renal disease: Secondary | ICD-10-CM | POA: Diagnosis not present

## 2015-02-05 DIAGNOSIS — D631 Anemia in chronic kidney disease: Secondary | ICD-10-CM | POA: Diagnosis not present

## 2015-02-06 DIAGNOSIS — Z992 Dependence on renal dialysis: Secondary | ICD-10-CM | POA: Diagnosis not present

## 2015-02-06 DIAGNOSIS — I120 Hypertensive chronic kidney disease with stage 5 chronic kidney disease or end stage renal disease: Secondary | ICD-10-CM | POA: Diagnosis not present

## 2015-02-06 DIAGNOSIS — N186 End stage renal disease: Secondary | ICD-10-CM | POA: Diagnosis not present

## 2015-02-07 DIAGNOSIS — N186 End stage renal disease: Secondary | ICD-10-CM | POA: Diagnosis not present

## 2015-02-07 DIAGNOSIS — Z992 Dependence on renal dialysis: Secondary | ICD-10-CM | POA: Diagnosis not present

## 2015-02-07 DIAGNOSIS — I120 Hypertensive chronic kidney disease with stage 5 chronic kidney disease or end stage renal disease: Secondary | ICD-10-CM | POA: Diagnosis not present

## 2015-02-08 DIAGNOSIS — D509 Iron deficiency anemia, unspecified: Secondary | ICD-10-CM | POA: Diagnosis not present

## 2015-02-08 DIAGNOSIS — I120 Hypertensive chronic kidney disease with stage 5 chronic kidney disease or end stage renal disease: Secondary | ICD-10-CM | POA: Diagnosis not present

## 2015-02-08 DIAGNOSIS — N186 End stage renal disease: Secondary | ICD-10-CM | POA: Diagnosis not present

## 2015-02-08 DIAGNOSIS — Z992 Dependence on renal dialysis: Secondary | ICD-10-CM | POA: Diagnosis not present

## 2015-02-08 DIAGNOSIS — L299 Pruritus, unspecified: Secondary | ICD-10-CM | POA: Diagnosis not present

## 2015-02-08 DIAGNOSIS — D631 Anemia in chronic kidney disease: Secondary | ICD-10-CM | POA: Diagnosis not present

## 2015-02-08 DIAGNOSIS — N2581 Secondary hyperparathyroidism of renal origin: Secondary | ICD-10-CM | POA: Diagnosis not present

## 2015-02-09 DIAGNOSIS — N186 End stage renal disease: Secondary | ICD-10-CM | POA: Diagnosis not present

## 2015-02-09 DIAGNOSIS — I120 Hypertensive chronic kidney disease with stage 5 chronic kidney disease or end stage renal disease: Secondary | ICD-10-CM | POA: Diagnosis not present

## 2015-02-09 DIAGNOSIS — Z992 Dependence on renal dialysis: Secondary | ICD-10-CM | POA: Diagnosis not present

## 2015-02-10 DIAGNOSIS — N186 End stage renal disease: Secondary | ICD-10-CM | POA: Diagnosis not present

## 2015-02-10 DIAGNOSIS — I120 Hypertensive chronic kidney disease with stage 5 chronic kidney disease or end stage renal disease: Secondary | ICD-10-CM | POA: Diagnosis not present

## 2015-02-10 DIAGNOSIS — Z992 Dependence on renal dialysis: Secondary | ICD-10-CM | POA: Diagnosis not present

## 2015-02-11 DIAGNOSIS — Z992 Dependence on renal dialysis: Secondary | ICD-10-CM | POA: Diagnosis not present

## 2015-02-11 DIAGNOSIS — N186 End stage renal disease: Secondary | ICD-10-CM | POA: Diagnosis not present

## 2015-02-11 DIAGNOSIS — I120 Hypertensive chronic kidney disease with stage 5 chronic kidney disease or end stage renal disease: Secondary | ICD-10-CM | POA: Diagnosis not present

## 2015-02-12 ENCOUNTER — Emergency Department: Payer: Medicare Other

## 2015-02-12 ENCOUNTER — Inpatient Hospital Stay
Admission: EM | Admit: 2015-02-12 | Discharge: 2015-02-15 | DRG: 291 | Disposition: A | Discharge status: Home / Self Care | Payer: Medicare Other | Attending: Internal Medicine | Admitting: Internal Medicine

## 2015-02-12 ENCOUNTER — Encounter: Payer: Self-pay | Admitting: Emergency Medicine

## 2015-02-12 DIAGNOSIS — T50905A Adverse effect of unspecified drugs, medicaments and biological substances, initial encounter: Secondary | ICD-10-CM | POA: Diagnosis present

## 2015-02-12 DIAGNOSIS — Z992 Dependence on renal dialysis: Secondary | ICD-10-CM | POA: Diagnosis not present

## 2015-02-12 DIAGNOSIS — J9601 Acute respiratory failure with hypoxia: Secondary | ICD-10-CM | POA: Diagnosis present

## 2015-02-12 DIAGNOSIS — Z882 Allergy status to sulfonamides status: Secondary | ICD-10-CM

## 2015-02-12 DIAGNOSIS — D709 Neutropenia, unspecified: Secondary | ICD-10-CM | POA: Diagnosis present

## 2015-02-12 DIAGNOSIS — Z905 Acquired absence of kidney: Secondary | ICD-10-CM

## 2015-02-12 DIAGNOSIS — J810 Acute pulmonary edema: Secondary | ICD-10-CM | POA: Diagnosis present

## 2015-02-12 DIAGNOSIS — R0602 Shortness of breath: Secondary | ICD-10-CM | POA: Diagnosis present

## 2015-02-12 DIAGNOSIS — E876 Hypokalemia: Secondary | ICD-10-CM | POA: Diagnosis present

## 2015-02-12 DIAGNOSIS — J441 Chronic obstructive pulmonary disease with (acute) exacerbation: Secondary | ICD-10-CM

## 2015-02-12 DIAGNOSIS — M6281 Muscle weakness (generalized): Secondary | ICD-10-CM | POA: Diagnosis not present

## 2015-02-12 DIAGNOSIS — Z91010 Allergy to peanuts: Secondary | ICD-10-CM

## 2015-02-12 DIAGNOSIS — J811 Chronic pulmonary edema: Secondary | ICD-10-CM | POA: Diagnosis not present

## 2015-02-12 DIAGNOSIS — Z888 Allergy status to other drugs, medicaments and biological substances status: Secondary | ICD-10-CM | POA: Diagnosis not present

## 2015-02-12 DIAGNOSIS — Z7952 Long term (current) use of systemic steroids: Secondary | ICD-10-CM | POA: Diagnosis not present

## 2015-02-12 DIAGNOSIS — Z880 Allergy status to penicillin: Secondary | ICD-10-CM | POA: Diagnosis not present

## 2015-02-12 DIAGNOSIS — I5033 Acute on chronic diastolic (congestive) heart failure: Secondary | ICD-10-CM | POA: Diagnosis not present

## 2015-02-12 DIAGNOSIS — I952 Hypotension due to drugs: Secondary | ICD-10-CM | POA: Diagnosis present

## 2015-02-12 DIAGNOSIS — Z940 Kidney transplant status: Secondary | ICD-10-CM | POA: Diagnosis not present

## 2015-02-12 DIAGNOSIS — I272 Other secondary pulmonary hypertension: Secondary | ICD-10-CM | POA: Diagnosis present

## 2015-02-12 DIAGNOSIS — R0902 Hypoxemia: Secondary | ICD-10-CM

## 2015-02-12 DIAGNOSIS — J984 Other disorders of lung: Secondary | ICD-10-CM | POA: Diagnosis present

## 2015-02-12 DIAGNOSIS — J189 Pneumonia, unspecified organism: Secondary | ICD-10-CM | POA: Diagnosis not present

## 2015-02-12 DIAGNOSIS — I509 Heart failure, unspecified: Secondary | ICD-10-CM

## 2015-02-12 DIAGNOSIS — I959 Hypotension, unspecified: Secondary | ICD-10-CM | POA: Diagnosis not present

## 2015-02-12 DIAGNOSIS — E875 Hyperkalemia: Secondary | ICD-10-CM | POA: Diagnosis present

## 2015-02-12 DIAGNOSIS — J9600 Acute respiratory failure, unspecified whether with hypoxia or hypercapnia: Secondary | ICD-10-CM | POA: Diagnosis not present

## 2015-02-12 DIAGNOSIS — I120 Hypertensive chronic kidney disease with stage 5 chronic kidney disease or end stage renal disease: Secondary | ICD-10-CM | POA: Diagnosis not present

## 2015-02-12 DIAGNOSIS — I132 Hypertensive heart and chronic kidney disease with heart failure and with stage 5 chronic kidney disease, or end stage renal disease: Principal | ICD-10-CM | POA: Diagnosis present

## 2015-02-12 DIAGNOSIS — N2581 Secondary hyperparathyroidism of renal origin: Secondary | ICD-10-CM | POA: Diagnosis present

## 2015-02-12 DIAGNOSIS — Z87891 Personal history of nicotine dependence: Secondary | ICD-10-CM | POA: Diagnosis not present

## 2015-02-12 DIAGNOSIS — N186 End stage renal disease: Secondary | ICD-10-CM | POA: Diagnosis present

## 2015-02-12 HISTORY — DX: Other disorders of lung: J98.4

## 2015-02-12 HISTORY — DX: Chronic obstructive pulmonary disease, unspecified: J44.9

## 2015-02-12 HISTORY — DX: Cryptogenic organizing pneumonia: J84.116

## 2015-02-12 HISTORY — DX: End stage renal disease: N18.6

## 2015-02-12 HISTORY — DX: Essential (primary) hypertension: I10

## 2015-02-12 LAB — CBC
HEMATOCRIT: 27.5 % — AB (ref 35.0–47.0)
HEMOGLOBIN: 8.9 g/dL — AB (ref 12.0–16.0)
MCH: 28.9 pg (ref 26.0–34.0)
MCHC: 32.5 g/dL (ref 32.0–36.0)
MCV: 88.9 fL (ref 80.0–100.0)
Platelets: 118 10*3/uL — ABNORMAL LOW (ref 150–440)
RBC: 3.09 MIL/uL — ABNORMAL LOW (ref 3.80–5.20)
RDW: 15.3 % — AB (ref 11.5–14.5)
WBC: 5.2 10*3/uL (ref 3.6–11.0)

## 2015-02-12 LAB — COMPREHENSIVE METABOLIC PANEL
ALBUMIN: 3.3 g/dL — AB (ref 3.5–5.0)
ALT: 17 U/L (ref 14–54)
AST: 23 U/L (ref 15–41)
Alkaline Phosphatase: 55 U/L (ref 38–126)
Anion gap: 15 (ref 5–15)
BILIRUBIN TOTAL: 0.8 mg/dL (ref 0.3–1.2)
BUN: 71 mg/dL — AB (ref 6–20)
CHLORIDE: 101 mmol/L (ref 101–111)
CO2: 23 mmol/L (ref 22–32)
CREATININE: 14.14 mg/dL — AB (ref 0.44–1.00)
Calcium: 6.5 mg/dL — ABNORMAL LOW (ref 8.9–10.3)
GFR calc Af Amer: 3 mL/min — ABNORMAL LOW (ref >60)
GFR calc non Af Amer: 3 mL/min — ABNORMAL LOW (ref >60)
GLUCOSE: 85 mg/dL (ref 65–99)
POTASSIUM: 5.8 mmol/L — AB (ref 3.5–5.1)
Sodium: 139 mmol/L (ref 135–145)
Total Protein: 5.6 g/dL — ABNORMAL LOW (ref 6.5–8.1)

## 2015-02-12 LAB — TROPONIN I: Troponin I: <0.03 ng/mL (ref <0.031)

## 2015-02-12 MED ORDER — CALCIUM CARBONATE ANTACID 500 MG PO CHEW
1.0000 | CHEWABLE_TABLET | Freq: Three times a day (TID) | ORAL | Status: DC
  Administered 2015-02-13 (×2): 200 mg via ORAL
  Filled 2015-02-12 (×3): qty 1

## 2015-02-12 MED ORDER — IPRATROPIUM-ALBUTEROL 0.5-2.5 (3) MG/3ML IN SOLN
RESPIRATORY_TRACT | Status: AC
  Administered 2015-02-12: 3 mL via RESPIRATORY_TRACT
  Filled 2015-02-12: qty 6

## 2015-02-12 MED ORDER — PREDNISONE 10 MG PO TABS
10.0000 mg | ORAL_TABLET | Freq: Every day | ORAL | Status: DC
  Administered 2015-02-13: 10 mg via ORAL
  Filled 2015-02-12: qty 1

## 2015-02-12 MED ORDER — ACETAMINOPHEN 650 MG RE SUPP: 650.0000 mg | Freq: Four times a day (QID) | RECTAL | Status: DC | PRN

## 2015-02-12 MED ORDER — ALBUTEROL SULFATE HFA 108 (90 BASE) MCG/ACT IN AERS: 2.0000 | INHALATION_SPRAY | Freq: Four times a day (QID) | RESPIRATORY_TRACT | Status: DC | PRN

## 2015-02-12 MED ORDER — ACETAMINOPHEN 325 MG PO TABS
650.0000 mg | ORAL_TABLET | Freq: Four times a day (QID) | ORAL | Status: DC | PRN
  Administered 2015-02-12: 650 mg via ORAL
  Filled 2015-02-12 (×2): qty 2

## 2015-02-12 MED ORDER — ONDANSETRON HCL 4 MG PO TABS: 4.0000 mg | ORAL_TABLET | Freq: Four times a day (QID) | ORAL | Status: DC | PRN

## 2015-02-12 MED ORDER — CINACALCET HCL 30 MG PO TABS
90.0000 mg | ORAL_TABLET | Freq: Every day | ORAL | Status: DC
  Administered 2015-02-13 – 2015-02-14 (×2): 90 mg via ORAL
  Filled 2015-02-12 (×2): qty 3

## 2015-02-12 MED ORDER — ALBUTEROL SULFATE (2.5 MG/3ML) 0.083% IN NEBU
2.5000 mg | INHALATION_SOLUTION | Freq: Four times a day (QID) | RESPIRATORY_TRACT | Status: DC | PRN
  Administered 2015-02-13: 2.5 mg via RESPIRATORY_TRACT
  Filled 2015-02-12: qty 3

## 2015-02-12 MED ORDER — ONDANSETRON HCL 4 MG/2ML IJ SOLN
4.0000 mg | Freq: Four times a day (QID) | INTRAMUSCULAR | Status: DC | PRN
  Administered 2015-02-13: 4 mg via INTRAVENOUS
  Filled 2015-02-12: qty 2

## 2015-02-12 NOTE — ED Notes (Signed)
After duoneb, pt states she is feeling much more "open" to breathe. Pt has copd.

## 2015-02-12 NOTE — ED Notes (Signed)
MD Siniani at bedside at this time
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
## 2015-02-12 NOTE — H&P (Signed)
[facility]   PATIENT NAME: Brittany [facility]    MR#:  [id number]  DATE OF BIRTH:  1965-05-20  DATE OF ADMISSION:  02/12/2015  PRIMARY CARE PHYSICIAN: Trey Sailors, MD   REQUESTING/REFERRING PHYSICIAN: Dr. Minna Antis  CHIEF COMPLAINT:   Chief Complaint  Patient presents with  . Shortness of Breath    HISTORY OF PRESENT ILLNESS:  Brittany [facility]  is a 50 y.o. female with a known history of end-stage renal disease on hemodialysis, hypertension, history of cryptogenic organizing pneumonia, history of COPD, restrictive lung disease presented to [facility] today as she was short of breath and also noted to be hypotensive. Patient says she developed a upper respiratory infection last week and took some over-the-counter decongestants. She thinks that her blood pressure was low due to these decongestants that she took. She went to hemodialysis and was feeling somewhat short of breath and was noted to be hypoxic with O2 sats in 80s and also noted to be hypotensive with systolic blood pressures in 80s. SHe was afebrile but due to her hemodynamic hemodynamic instability she was sent to the ER for further evaluation and did not require hemodialysis. Patient presently here remains on to 2 L nasal cannula and O2 sats is stable. She was noted to be mildly hyperkalemic and had chest x-ray findings suggestive of CHF and hospitalist services were contacted further treatment and evaluation.  PAST MEDICAL HISTORY:   Past Medical History  Diagnosis Date  . End stage renal disease (HCC)   . Restrictive lung disease   . COPD (chronic obstructive pulmonary disease) (HCC)   . Essential hypertension   . Cryptogenic organizing pneumonia (HCC)     PAST SURGICAL HISTORY:  History reviewed. No pertinent past surgical history.  SOCIAL HISTORY:   Social History  Substance Use Topics  . Smoking status: Former Smoker    Types: Cigarettes  .  Smokeless tobacco: Not on file  . Alcohol Use: Yes     Comment: occas.     FAMILY HISTORY:   Family History  Problem Relation Age of Onset  . Diabetes Sister     DRUG ALLERGIES:   Allergies  Allergen Reactions  . Iron Anaphylaxis  . Peanuts [Peanut Oil] Other (See Comments)    Reaction:  Unknown   . Penicillins Shortness Of Breath and Other (See Comments)    Has patient had a PCN reaction causing immediate rash, facial/tongue/throat swelling, SOB or lightheadedness with hypotension: Yes Has patient had a PCN reaction causing severe rash involving mucus membranes or skin necrosis: No Has patient had a PCN reaction that required hospitalization No Has patient had a PCN reaction occurring within the last 10 years: No If all of the above answers are "NO", then may proceed with Cephalosporin use.  Marland Kitchen Potassium-Containing Compounds Shortness Of Breath and Other (See Comments)    Dialysis solution with 4K (per record).   Doylene Bode [Sulfamethoxazole-Trimethoprim] Shortness Of Breath  . Skelaxin [Metaxalone] Shortness Of Breath  . Erythromycin Rash  . Iodine Other (See Comments)    Reaction:  Unknown     REVIEW OF SYSTEMS:   Review of Systems  Constitutional: Positive for malaise/fatigue. Negative for fever and chills.  HENT: Negative for congestion and tinnitus.   Eyes: Negative for blurred vision and double vision.  Respiratory: Positive for cough. Negative for shortness of breath and wheezing.   Cardiovascular: Negative for chest pain, orthopnea and PND.  Gastrointestinal: Negative for nausea, vomiting,  abdominal pain and diarrhea.  Genitourinary: Negative for dysuria and hematuria.  Neurological: Negative for dizziness, sensory change and focal weakness.  All other systems reviewed and are negative.   MEDICATIONS AT HOME:   Prior to Admission medications   Medication Sig Start Date End Date Taking? Authorizing Provider  albuterol (PROVENTIL HFA;VENTOLIN HFA) 108 (90 Base)  MCG/ACT inhaler Inhale 2 puffs into the lungs every 6 (six) hours as needed for wheezing or shortness of breath.   Yes Historical Provider, MD  calcium carbonate (TUMS - DOSED IN MG ELEMENTAL CALCIUM) 500 MG chewable tablet Chew 1 tablet by mouth 3 (three) times daily.   Yes Historical Provider, MD  carvedilol (COREG) 25 MG tablet Take 25 mg by mouth 2 (two) times daily.   Yes Historical Provider, MD  cinacalcet (SENSIPAR) 30 MG tablet Take 90 mg by mouth daily.   Yes Historical Provider, MD  NIFEdipine (PROCARDIA XL/ADALAT-CC) 90 MG 24 hr tablet Take 90 mg by mouth daily.   Yes Historical Provider, MD  predniSONE (DELTASONE) 10 MG tablet Take 10 mg by mouth daily.   Yes Historical Provider, MD      VITAL SIGNS:  Blood pressure 100/77, pulse 66, temperature 97.1 F (36.2 C), temperature source Oral, resp. rate 23, height 5\' 4"  (1.626 m), weight 73 kg (160 lb 15 oz), last menstrual period 02/07/2015, SpO2 91 %.  PHYSICAL EXAMINATION:  Physical Exam  GENERAL:  50 y.o.-year-old patient lying in the bed with no acute distress.  EYES: Pupils equal, round, reactive to light and accommodation. No scleral icterus. Extraocular muscles intact.  HEENT: Head atraumatic, normocephalic. Oropharynx and nasopharynx clear. No oropharyngeal erythema, moist oral mucosa  NECK:  Supple, no jugular venous distention. No thyroid enlargement, no tenderness.  LUNGS: Normal breath sounds bilaterally, bibasilar rales, No rhonchi, wheezing. No use of accessory muscles of respiration.  CARDIOVASCULAR: S1, S2 RRR. No murmurs, rubs, gallops, clicks.  ABDOMEN: Soft, nontender, nondistended. Bowel sounds present. No organomegaly or mass.  EXTREMITIES: No pedal edema, cyanosis, or clubbing. + 2 pedal & radial pulses b/l.   NEUROLOGIC: Cranial nerves II through XII are intact. No focal Motor or sensory deficits appreciated b/l.   PSYCHIATRIC: The patient is alert and oriented x 3. Good affect.  SKIN: No obvious rash, lesion,  or ulcer.   Right upper extremity AV fistula with good bruit and good thrill.  LABORATORY PANEL:   CBC  Recent Labs Lab 02/12/15 1427  WBC 5.2  HGB 8.9*  HCT 27.5*  PLT 118*   ------------------------------------------------------------------------------------------------------------------  Chemistries   Recent Labs Lab 02/12/15 1427  NA 139  K 5.8*  CL 101  CO2 23  GLUCOSE 85  BUN 71*  CREATININE 14.14*  CALCIUM 6.5*  AST 23  ALT 17  ALKPHOS 55  BILITOT 0.8   ------------------------------------------------------------------------------------------------------------------  Cardiac Enzymes  Recent Labs Lab 02/12/15 1427  TROPONINI <0.03   ------------------------------------------------------------------------------------------------------------------  RADIOLOGY:  Dg Chest 2 View  02/12/2015  CLINICAL DATA:  Shortness of breath EXAM: CHEST  2 VIEW COMPARISON:  10/21/2009 FINDINGS: Symmetric perihilar interstitial and airspace opacity compatible with edema. Small pleural effusions bilaterally. Chronic cardiopericardial enlargement and aortic tortuosity. Chronic hyperinflation. Remote bilateral rib fractures. Brachiocephalic vein stenting on the right. 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 IMPRESSION: CHF Electronically Signed   By: Marnee Spring M.D.   On: 02/12/2015 15:51     IMPRESSION AND PLAN:   50 year old female with past medical history of end-stage renal disease on hemodialysis,  hypertension, secondary hyperparathyroidism, restrictive lung disease, history of cryptogenic organizing pneumonia, presents to [facility] due to shortness of breath, hypotension and hemodialysis.  #1 shortness of breath/hypoxia-etiology unclear but probably a combination of mild CHF combined with underlying chronic lung disease. -Patient will get hemodialysis here at the [facility] for fluid removal and we'll follow her  clinically. -We'll need to assess patient for home oxygen prior to discharge given her chronic lung disease.  #2 hypotension-etiology unclear. No evidence of sepsis, infection. Patient does have a history of pulmonary hypertension given her echocardiogram findings last year. -I will hold her nifedipine, Coreg. She will get hemodialysis and will follow hemodynamics. -I will not start any antibiotics presently.  #3 end-stage renal disease on hemodialysis-patient gets dialysis on Tuesday Thursday and Saturday. -Consult nephrology and patient will get dialysis today as she did not get it as outpatient.  #4 hypokalemia-secondary to the end-stage renal disease. -We'll follow potassium. She improved with hemodialysis.  #5 secondary hyperparathyroidism-continue Sensipar  #6 history of cryptogenic organizing pneumonia/restrictive lung disease-[facility] -Continue maintenance prednisone. -She needs to be assessed for home oxygen prior to discharge.     All the records are reviewed and case discussed with ED provider. Management plans discussed with the patient, family and they are in agreement.  CODE STATUS: Full  TOTAL TIME TAKING CARE OF THIS PATIENT: 45 minutes.    [place] SirenSAINANI,VIVEK J M.D on 02/12/2015 at 4:56 PM  Between 7am to 6pm - Pager - [phone]  After 6pm go to [email or web address] - password [email or web address] [facility]  [facility]  Office  [phone]  CC: Primary care physician; Trey SailorsMOTTL,AMY, MD

## 2015-02-12 NOTE — ED Notes (Signed)
Pt c/o sore throat for 2 days, was at dialysis today, didn't receive tx d/t sob and low bp, sent here via EMS. Pt sob upon initial assessment. [place]ood at 3L intact, sats 95%. Pt c/o slight tightness in her chest, appears calm, however. bp 138/91. SR on the monitor.

## 2015-02-12 NOTE — Progress Notes (Signed)
Pt arrived on floor. VSS.   [facility]any [facility]   

## 2015-02-12 NOTE — ED Notes (Signed)
Pt states she feels tight with her breathing, duo neb given. Pt tolerating well.

## 2015-02-12 NOTE — ED Provider Notes (Signed)
[facility] Emergency Department Provider Note  Time seen: 2:38 PM  I have reviewed the triage vital signs and the nursing notes.   HISTORY  Chief Complaint Shortness of Breath    HPI Brittany [facility] is a 50 y.o. female with a past medical history of COPD, end-stage renal disease on hemodialysis Tuesday, Thursday, Saturday who presents to the emergency department with dyspnea and hypotension. According to the patient for the past 2 days she has been feeling lightheaded at times as well as short of breath. She thought it was just because she needed dialysis. She went to dialysis today and was noted to have a low blood pressure as well as an oxygen saturation of 80% so she was sent to the emergency department for evaluation. Upon arrival the patient has a room air O2 saturation of 86% which increases to 95% on 3 L nasal cannula. Patient does not wear home oxygen. She denies any chest pain states she has been feeling short of breath with a mild sore throat, but states the sore throat has resolved.  Patient denies any complaints currently.    History reviewed. No pertinent past medical history.  There are no active problems to display for this patient.   History reviewed. No pertinent past surgical history.  No current outpatient prescriptions on file.  Allergies Iron; Peanuts; Penicillins; Potassium-containing compounds; Septra; Skelaxin; Erythromycin; and Iodine  No family history on file.  Social History Social History  Substance Use Topics  . Smoking status: Former Smoker    Types: Cigarettes  . Smokeless tobacco: None  . Alcohol Use: Yes     Comment: occas.     Review of Systems Constitutional: Negative for fever. Cardiovascular: Negative for chest pain. Respiratory: Negative for shortness of breath. Gastrointestinal: Negative for abdominal pain Neurological: Negative for headache 10-point ROS otherwise  negative.  ____________________________________________   PHYSICAL EXAM:  VITAL SIGNS: ED Triage Vitals  Enc Vitals Group     BP 02/12/15 1416 138/91 mmHg     Pulse Rate 02/12/15 1416 67     Resp 02/12/15 1416 22     Temp 02/12/15 1416 97.1 F (36.2 C)     Temp Source 02/12/15 1416 Oral     SpO2 02/12/15 1416 95 %     Weight 02/12/15 1416 160 lb 15 oz (73 kg)     Height 02/12/15 1416 5\' 4"  (1.626 m)     Head Cir --      Peak Flow --      Pain Score --      Pain Loc --      Pain Edu? --      Excl. in GC? --     Constitutional: Alert and oriented. Well appearing and in no distress. Eyes: Normal exam ENT   Head: Normocephalic and atraumatic   Mouth/Throat: Mucous membranes are moist. Cardiovascular: Normal rate, regular rhythm. No murmur Respiratory: Normal respiratory effort without tachypnea nor retractions. Breath sounds are clear and equal bilaterally. No wheezes/rales/rhonchi. Gastrointestinal: Soft and nontender. No distention.   Musculoskeletal: Nontender with normal range of motion in all extremities. Right upper extremity AV fistula. Neurologic:  Normal speech and language. No gross focal neurologic deficits Skin:  Skin is warm, dry and intact.  Psychiatric: Mood and affect are normal. Speech and behavior are normal.   ____________________________________________    EKG  EKG reviewed and interpreted by myself shows normal sinus rhythm at 68 bpm, slightly widened QRS, normal axis, normal intervals, nonspecific  ST changes. No ST elevations.  ____________________________________________    RADIOLOGY  Chest x-ray on my read most consistent with pulmonary edema. Awaiting official radiology read.  ____________________________________________    INITIAL IMPRESSION / ASSESSMENT AND PLAN / ED COURSE  Pertinent labs & imaging results that were available during my care of the patient were reviewed by me and considered in my medical decision making (see  chart for details).  Patient presents to the emergency department with dyspnea, lightheadedness found to be hypotensive and hypoxic at dialysis. Patient did not receive dialysis but was instead sent to the emergency department for evaluation. Patient has received 1 L of IV fluids and her blood pressure has increased to 97 systolic.  Patient's blood pressure currently 104/79. Patient currently satting 87% on room air, 93% on 3 L. X-ray consistent with pulmonary edema. We'll admit the patient to the hospital for dialysis. Patient agreeable to plan.  ____________________________________________   FINAL CLINICAL IMPRESSION(S) / ED DIAGNOSES  Hypotension Hypoxia Pulmonary edema  Minna AntisKevin Lura Falor, MD 02/12/15 1554

## 2015-02-12 NOTE — ED Notes (Signed)
Pt given lunch tray per okay from MD Sinani. Pt sitting up in bed, eating and tolerating well, no acute distress noted.

## 2015-02-12 NOTE — Consult Note (Signed)
Date: 02/12/2015                  Patient Name:  Brittany Ramirez  MRN: [id number]  DOB: 12/25/1965  Age / Sex: 50 y.o., female         PCP: Trey SailorsMOTTL,AMY, MD                 Service Requesting Consult:  internal medicine                  Reason for Consult:  end-stage renal disease             History of Present Illness: Patient is a 50 y.o. female with medical problems of ESRD, pulmonary hypertension, who was admitted to [facility] on 02/12/2015 for evaluation of shortness of breath and hypotension.  Patient normally dialyzes in [place] on Tuesday, Thursday, Saturday schedule and is followed by [facility] nephrology. She has been on dialysis since 2007. She had a kidney transplant in the past. She went to dialysis today but treatment was not started due to low blood pressure. She was sent to the hospital via EMS In the ER sats were low in the 80s, which improved to mid 90s with oxygen supplementation with nasal cannula Patient correlates her blood pressure decreased with taking Coricidin HBP which she took for sore throat and cough Dialysis treatment was on Friday Patient used to be a dialysis technician. She self cannulates.   Medications: Outpatient medications: Prescriptions prior to admission  Medication Sig Dispense Refill Last Dose  . albuterol (PROVENTIL HFA;VENTOLIN HFA) 108 (90 Base) MCG/ACT inhaler Inhale 2 puffs into the lungs every 6 (six) hours as needed for wheezing or shortness of breath.   Past Week at Unknown time  . calcium carbonate (TUMS - DOSED IN MG ELEMENTAL CALCIUM) 500 MG chewable tablet Chew 1 tablet by mouth 3 (three) times daily.   02/11/2015 at Unknown time  . carvedilol (COREG) 25 MG tablet Take 25 mg by mouth 2 (two) times daily.   02/11/2015 at 2100  . cinacalcet (SENSIPAR) 30 MG tablet Take 90 mg by mouth daily.   02/11/2015 at Unknown time  . NIFEdipine (PROCARDIA XL/ADALAT-CC) 90 MG 24 hr tablet Take 90 mg by mouth daily.   02/11/2015 at Unknown time  . predniSONE  (DELTASONE) 10 MG tablet Take 10 mg by mouth daily.   02/11/2015 at Unknown time    Current medications: Current Facility-Administered Medications  Medication Dose Route Frequency Provider Last Rate Last Dose  . acetaminophen (TYLENOL) tablet 650 mg  650 mg Oral Q6H PRN Houston SirenVivek J Sainani, MD   650 mg at 02/12/15 1829   Or  . acetaminophen (TYLENOL) suppository 650 mg  650 mg Rectal Q6H PRN Houston SirenVivek J Sainani, MD      . albuterol (PROVENTIL) (2.5 MG/3ML) 0.083% nebulizer solution 2.5 mg  2.5 mg Nebulization Q6H PRN Houston SirenVivek J Sainani, MD      . calcium carbonate (TUMS - dosed in mg elemental calcium) chewable tablet 200 mg of elemental calcium  1 tablet Oral TID Houston SirenVivek J Sainani, MD      . cinacalcet (SENSIPAR) tablet 90 mg  90 mg Oral QPC supper Houston SirenVivek J Sainani, MD   90 mg at 02/12/15 1815  . ondansetron (ZOFRAN) tablet 4 mg  4 mg Oral Q6H PRN Houston SirenVivek J Sainani, MD       Or  . ondansetron (ZOFRAN) injection 4 mg  4 mg Intravenous Q6H PRN Houston SirenVivek J Sainani, MD      . [  START ON 02/13/2015] predniSONE (DELTASONE) tablet 10 mg  10 mg Oral Daily Houston Siren, MD          Allergies: Allergies  Allergen Reactions  . Iron Anaphylaxis  . Peanuts [Peanut Oil] Other (See Comments)    Reaction:  Unknown   . Penicillins Shortness Of Breath and Other (See Comments)    Has patient had a PCN reaction causing immediate rash, facial/tongue/throat swelling, SOB or lightheadedness with hypotension: Yes Has patient had a PCN reaction causing severe rash involving mucus membranes or skin necrosis: No Has patient had a PCN reaction that required hospitalization No Has patient had a PCN reaction occurring within the last 10 years: No If all of the above answers are "NO", then may proceed with Cephalosporin use.  Marland Kitchen Potassium-Containing Compounds Shortness Of Breath and Other (See Comments)    Dialysis solution with 4K (per record).   Doylene Bode [Sulfamethoxazole-Trimethoprim] Shortness Of Breath  . Skelaxin [Metaxalone]  Shortness Of Breath  . Erythromycin Rash  . Iodine Other (See Comments)    Reaction:  Unknown       Past Medical History: Past Medical History  Diagnosis Date  . End stage renal disease (HCC)   . Restrictive lung disease   . COPD (chronic obstructive pulmonary disease) (HCC)   . Essential hypertension   . Cryptogenic organizing pneumonia [facility])      Past Surgical History: Past Surgical History  Procedure Laterality Date  . Av fistula right lower arm Right      Family History: Family History  Problem Relation Age of Onset  . Diabetes Sister      Social History: Social History   Social History  . Marital Status: Divorced    Spouse Name: N/A  . Number of Children: N/A  . Years of Education: N/A   Occupational History  . Not on file.   Social History Main Topics  . Smoking status: Former Smoker    Types: Cigarettes  . Smokeless tobacco: Not on file  . Alcohol Use: Yes     Comment: occas.   . Drug Use: Not on file  . Sexual Activity: Yes    Birth Control/ Protection: Condom   Other Topics Concern  . Not on file   Social History Narrative     Review of Systems: Gen: No fevers or chills HEENT: Sore throat has improved CV: Some shortness of breath but improved at present Resp: Shortness of breath improved with oxygen, no sputum GI: No problems reported GU : No problems reported MS: No complaints Derm:  No complaints Psych: No complaints Heme: No complaints Neuro: No complaints Endocrine no complaints  Vital Signs: Blood pressure 108/76, pulse 75, temperature 98.4 F (36.9 C), temperature source Oral, resp. rate 17, height 5\' 4"  (1.626 m), weight 72.576 kg (160 lb), last menstrual period 02/07/2015, SpO2 93 %.  No intake or output data in the 24 hours ending 02/12/15 2029  Weight trends: Filed Weights   02/12/15 1416 02/12/15 1832  Weight: 73 kg (160 lb 15 oz) 72.576 kg (160 lb)    Physical Exam: General:  laying in the bed, no acute  distress   HEENT  anicteric, moist oral mucous membranes   Neck:  supple   Lungs:  bilateral diffuse rhonchi and crackles, normal respiratory effort, oxygen by nasal cannula   Heart::  regular rhythm, prominent 3/6 systolic murmur over left sternal border   Abdomen:  soft, nontender   Extremities:  1-2+ pitting edema over her  legs   Neurologic:  alert, oriented, nonfocal   Skin:  no acute rashes   Access:  right forearm aneurysmal fistula  Foley:        Lab results: Basic Metabolic Panel:  Recent Labs Lab 02/12/15 1427  NA 139  K 5.8*  CL 101  CO2 23  GLUCOSE 85  BUN 71*  CREATININE 14.14*  CALCIUM 6.5*    Liver Function Tests:  Recent Labs Lab 02/12/15 1427  AST 23  ALT 17  ALKPHOS 55  BILITOT 0.8  PROT 5.6*  ALBUMIN 3.3*   No results for input(s): LIPASE, AMYLASE in the last 168 hours. No results for input(s): AMMONIA in the last 168 hours.  CBC:  Recent Labs Lab 02/12/15 1427  WBC 5.2  HGB 8.9*  HCT 27.5*  MCV 88.9  PLT 118*    Cardiac Enzymes:  Recent Labs Lab 02/12/15 1427  TROPONINI <0.03    BNP: Invalid input(s): POCBNP  CBG: No results for input(s): GLUCAP in the last 168 hours.  Microbiology: No results found for this or any previous visit (from the past 720 hour(s)).   Coagulation Studies: No results for input(s): LABPROT, INR in the last 72 hours.  Urinalysis: No results for input(s): COLORURINE, LABSPEC, PHURINE, GLUCOSEU, HGBUR, BILIRUBINUR, KETONESUR, PROTEINUR, UROBILINOGEN, NITRITE, LEUKOCYTESUR in the last 72 hours.  Invalid input(s): APPERANCEUR      Imaging: Dg Chest 2 View  02/12/2015  CLINICAL DATA:  Shortness of breath EXAM: CHEST  2 VIEW COMPARISON:  10/21/2009 FINDINGS: Symmetric perihilar interstitial and airspace opacity compatible with edema. Small pleural effusions bilaterally. Chronic cardiopericardial enlargement and aortic tortuosity. Chronic hyperinflation. Remote bilateral rib fractures.  Brachiocephalic vein stenting on the right. 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 IMPRESSION: CHF Electronically Signed   By: Marnee Spring M.D.   On: 02/12/2015 15:51      Assessment & Plan: Pt is a 50 y.o. yo female with a PMHX of end-stage renal disease, on dialysis since 2007. History of kidney transplant at [facility] in 1995. Transplant nephrectomy 2007. History of interstitial lung disease, maintained on prednisone 10 mg daily, pulmonary hypertension, left hip necrosis, was admitted on 02/12/2015 with hypotension and shortness of breath.   1. End-stage renal disease. [place] [facility]. Tuesday/Thursday/Saturday. [facility] nephrology. 3 hours. Self cannulation.  2. Shortness of breath. Worsening of interstitial lung disease versus pulmonary edema  3. Pulmonary hypertension  4. Hypotension  5. Hyperkalemia    plan: Patient has underlying pulmonary hypertension and interstitial lung disease which makes her intravascular volume assessment rather difficult. Pulmonary crackles could be due to interstitial lung disease She correlates her decrease in blood pressure to taking Coricidin HBP  She received IV fluid bolus in the EMS  Currently she is requiring oxygen by nasal cannula  She is hesitant to have fluid removed with dialysis. Therefore, we will not remove any fluid today We will monitor how she does with dialysis We will give her trial of midodrine for blood pressure support

## 2015-02-13 ENCOUNTER — Inpatient Hospital Stay: Payer: Medicare Other

## 2015-02-13 DIAGNOSIS — J441 Chronic obstructive pulmonary disease with (acute) exacerbation: Secondary | ICD-10-CM

## 2015-02-13 DIAGNOSIS — I5033 Acute on chronic diastolic (congestive) heart failure: Secondary | ICD-10-CM

## 2015-02-13 DIAGNOSIS — J9600 Acute respiratory failure, unspecified whether with hypoxia or hypercapnia: Secondary | ICD-10-CM

## 2015-02-13 DIAGNOSIS — Z992 Dependence on renal dialysis: Secondary | ICD-10-CM | POA: Diagnosis not present

## 2015-02-13 DIAGNOSIS — N186 End stage renal disease: Secondary | ICD-10-CM | POA: Diagnosis not present

## 2015-02-13 DIAGNOSIS — J189 Pneumonia, unspecified organism: Secondary | ICD-10-CM

## 2015-02-13 DIAGNOSIS — I120 Hypertensive chronic kidney disease with stage 5 chronic kidney disease or end stage renal disease: Secondary | ICD-10-CM | POA: Diagnosis not present

## 2015-02-13 LAB — BLOOD GAS, ARTERIAL
Acid-Base Excess: 7 mmol/L — ABNORMAL HIGH (ref 0.0–3.0)
BICARBONATE: 35.4 meq/L — AB (ref 21.0–28.0)
Delivery systems: POSITIVE
Expiratory PAP: 6
FIO2: 0.7
INSPIRATORY PAP: 16
Mechanical Rate: 8
O2 SAT: 94.9 %
PATIENT TEMPERATURE: 37
PCO2 ART: 77 mmHg — AB (ref 32.0–48.0)
PO2 ART: 85 mmHg (ref 83.0–108.0)
pH, Arterial: 7.27 — ABNORMAL LOW (ref 7.350–7.450)

## 2015-02-13 LAB — CBC
HCT: 28.8 % — ABNORMAL LOW (ref 35.0–47.0)
Hemoglobin: 9.4 g/dL — ABNORMAL LOW (ref 12.0–16.0)
MCH: 28.7 pg (ref 26.0–34.0)
MCHC: 32.5 g/dL (ref 32.0–36.0)
MCV: 88.3 fL (ref 80.0–100.0)
PLATELETS: 105 10*3/uL — AB (ref 150–440)
RBC: 3.27 MIL/uL — ABNORMAL LOW (ref 3.80–5.20)
RDW: 15.3 % — AB (ref 11.5–14.5)
WBC: 4.9 10*3/uL (ref 3.6–11.0)

## 2015-02-13 LAB — BASIC METABOLIC PANEL
Anion gap: 8 (ref 5–15)
BUN: 26 mg/dL — AB (ref 6–20)
CALCIUM: 7.4 mg/dL — AB (ref 8.9–10.3)
CO2: 33 mmol/L — ABNORMAL HIGH (ref 22–32)
CREATININE: 6.89 mg/dL — AB (ref 0.44–1.00)
Chloride: 101 mmol/L (ref 101–111)
GFR calc Af Amer: 7 mL/min — ABNORMAL LOW (ref >60)
GFR, EST NON AFRICAN AMERICAN: 6 mL/min — AB (ref >60)
Glucose, Bld: 90 mg/dL (ref 65–99)
Potassium: 4.1 mmol/L (ref 3.5–5.1)
SODIUM: 142 mmol/L (ref 135–145)

## 2015-02-13 LAB — GLUCOSE, CAPILLARY: Glucose-Capillary: 94 mg/dL (ref 65–99)

## 2015-02-13 LAB — MRSA PCR SCREENING: MRSA BY PCR: NEGATIVE

## 2015-02-13 MED ORDER — BUTALBITAL-APAP-CAFFEINE 50-325-40 MG PO TABS
1.0000 | ORAL_TABLET | ORAL | Status: DC
  Filled 2015-02-13: qty 1

## 2015-02-13 MED ORDER — BUDESONIDE 0.25 MG/2ML IN SUSP
0.2500 mg | Freq: Two times a day (BID) | RESPIRATORY_TRACT | Status: DC
  Administered 2015-02-13 – 2015-02-14 (×3): 0.25 mg via RESPIRATORY_TRACT
  Filled 2015-02-13 (×6): qty 2

## 2015-02-13 MED ORDER — CARVEDILOL 12.5 MG PO TABS
25.0000 mg | ORAL_TABLET | Freq: Two times a day (BID) | ORAL | Status: DC
  Administered 2015-02-13: 25 mg via ORAL
  Filled 2015-02-13 (×2): qty 2
  Filled 2015-02-13: qty 1

## 2015-02-13 MED ORDER — METHYLPREDNISOLONE SODIUM SUCC 125 MG IJ SOLR
60.0000 mg | Freq: Two times a day (BID) | INTRAMUSCULAR | Status: DC
  Administered 2015-02-14: 60 mg via INTRAVENOUS
  Filled 2015-02-13 (×2): qty 0.96
  Filled 2015-02-13: qty 2
  Filled 2015-02-13: qty 0.96

## 2015-02-13 MED ORDER — VANCOMYCIN HCL 10 G IV SOLR
1750.0000 mg | Freq: Once | INTRAVENOUS | Status: AC
  Administered 2015-02-13: 1750 mg via INTRAVENOUS
  Filled 2015-02-13: qty 1750

## 2015-02-13 MED ORDER — IPRATROPIUM-ALBUTEROL 0.5-2.5 (3) MG/3ML IN SOLN
3.0000 mL | RESPIRATORY_TRACT | Status: DC
  Administered 2015-02-12 – 2015-02-14 (×5): 3 mL via RESPIRATORY_TRACT
  Filled 2015-02-13 (×12): qty 3

## 2015-02-13 MED ORDER — VANCOMYCIN HCL IN DEXTROSE 750-5 MG/150ML-% IV SOLN
750.0000 mg | INTRAVENOUS | Status: DC
  Filled 2015-02-13: qty 150

## 2015-02-13 MED ORDER — SODIUM CHLORIDE 0.9 % IV SOLN
500.0000 mg | Freq: Once | INTRAVENOUS | Status: AC
  Administered 2015-02-13: 500 mg via INTRAVENOUS
  Filled 2015-02-13 (×2): qty 0.5

## 2015-02-13 MED ORDER — METHYLPREDNISOLONE SODIUM SUCC 125 MG IJ SOLR
80.0000 mg | INTRAMUSCULAR | Status: AC
  Administered 2015-02-13: 80 mg via INTRAVENOUS
  Filled 2015-02-13: qty 1.28

## 2015-02-13 MED ORDER — NIFEDIPINE ER OSMOTIC RELEASE 90 MG PO TB24
90.0000 mg | ORAL_TABLET | Freq: Every day | ORAL | Status: DC
  Filled 2015-02-13 (×3): qty 1

## 2015-02-13 MED ORDER — HEPARIN SODIUM (PORCINE) 5000 UNIT/ML IJ SOLN
5000.0000 [IU] | Freq: Two times a day (BID) | INTRAMUSCULAR | Status: DC
  Administered 2015-02-13: 5000 [IU] via SUBCUTANEOUS
  Filled 2015-02-13 (×3): qty 1

## 2015-02-13 MED ORDER — CHLORHEXIDINE GLUCONATE 0.12 % MT SOLN
15.0000 mL | Freq: Two times a day (BID) | OROMUCOSAL | Status: DC
  Administered 2015-02-13 – 2015-02-14 (×2): 15 mL via OROMUCOSAL
  Filled 2015-02-13 (×3): qty 15

## 2015-02-13 MED ORDER — ASPIRIN-ACETAMINOPHEN-CAFFEINE 250-250-65 MG PO TABS
1.0000 | ORAL_TABLET | ORAL | Status: AC
  Administered 2015-02-13: 1 via ORAL
  Filled 2015-02-13: qty 1

## 2015-02-13 MED ORDER — SODIUM CHLORIDE 0.9 % IV SOLN
500.0000 mg | INTRAVENOUS | Status: DC
  Filled 2015-02-13 (×2): qty 0.5

## 2015-02-13 MED ORDER — ASPIRIN-ACETAMINOPHEN-CAFFEINE 250-250-65 MG PO TABS
1.0000 | ORAL_TABLET | Freq: Once | ORAL | Status: AC
  Administered 2015-02-13: 1 via ORAL
  Filled 2015-02-13: qty 1

## 2015-02-13 MED ORDER — CETYLPYRIDINIUM CHLORIDE 0.05 % MT LIQD
7.0000 mL | Freq: Two times a day (BID) | OROMUCOSAL | Status: DC
Start: 2015-02-14 — End: 2015-02-15

## 2015-02-13 NOTE — Consult Note (Signed)
[facility] Pulmonary Medicine Consultation      Name: Brittany Ramirez MRN: [id number] DOB: November 03, 1965    ADMISSION DATE:  02/12/2015    CHIEF COMPLAINT:   Acute resp failure   HISTORY OF PRESENT ILLNESS  50 y.o. female with a known history of ESRD on HD, h/o cryptogenic organizing pneumonia, history of COPD, restrictive lung disease  Previous FEV1 30% -presented to the hospital from hemodialysis for increased short of breath and also noted to be hypotensive.  -no fevers -reports of URI symptoms last week -patient seen at HD with increased WOB on BiPAP fio2 80% -Patient with labile BP SHe was afebrile  Patient is able to communicate however, has extreme SOB on biPAP  Patient     PAST MEDICAL HISTORY    :  Past Medical History  Diagnosis Date  . End stage renal disease (HCC)   . Restrictive lung disease   . COPD (chronic obstructive pulmonary disease) (HCC)   . Essential hypertension   . Cryptogenic organizing pneumonia [facility])    Past Surgical History  Procedure Laterality Date  . Av fistula right lower arm Right    Prior to Admission medications   Medication Sig Start Date End Date Taking? Authorizing Provider  albuterol (PROVENTIL HFA;VENTOLIN HFA) 108 (90 Base) MCG/ACT inhaler Inhale 2 puffs into the lungs every 6 (six) hours as needed for wheezing or shortness of breath.   Yes Historical Provider, MD  calcium carbonate (TUMS - DOSED IN MG ELEMENTAL CALCIUM) 500 MG chewable tablet Chew 1 tablet by mouth 3 (three) times daily.   Yes Historical Provider, MD  carvedilol (COREG) 25 MG tablet Take 25 mg by mouth 2 (two) times daily.   Yes Historical Provider, MD  cinacalcet (SENSIPAR) 30 MG tablet Take 90 mg by mouth daily.   Yes Historical Provider, MD  NIFEdipine (PROCARDIA XL/ADALAT-CC) 90 MG 24 hr tablet Take 90 mg by mouth daily.   Yes Historical Provider, MD  predniSONE (DELTASONE) 10 MG tablet Take 10 mg by mouth daily.   Yes Historical Provider, MD    Allergies  Allergen Reactions  . Iron Anaphylaxis  . Peanuts [Peanut Oil] Other (See Comments)    Reaction:  Unknown   . Penicillins Shortness Of Breath and Other (See Comments)    Has patient had a PCN reaction causing immediate rash, facial/tongue/throat swelling, SOB or lightheadedness with hypotension: Yes Has patient had a PCN reaction causing severe rash involving mucus membranes or skin necrosis: No Has patient had a PCN reaction that required hospitalization No Has patient had a PCN reaction occurring within the last 10 years: No If all of the above answers are "NO", then may proceed with Cephalosporin use.  Marland Kitchen Potassium-Containing Compounds Shortness Of Breath and Other (See Comments)    Dialysis solution with 4K (per record).   Doylene Bode [Sulfamethoxazole-Trimethoprim] Shortness Of Breath  . Skelaxin [Metaxalone] Shortness Of Breath  . Erythromycin Rash  . Iodine Other (See Comments)    Reaction:  Unknown      FAMILY HISTORY   Family History  Problem Relation Age of Onset  . Diabetes Sister       SOCIAL HISTORY    reports that she has quit smoking. Her smoking use included Cigarettes. She does not have any smokeless tobacco history on file. She reports that she drinks alcohol. Her drug history is not on file.  Review of Systems  Constitutional: Positive for malaise/fatigue. Negative for fever and chills.  Eyes: Negative for blurred vision.  Respiratory: Positive for shortness of breath and wheezing.   Cardiovascular: Positive for chest pain.  Gastrointestinal: Negative for vomiting and abdominal pain.  Neurological: Negative for dizziness and headaches.  Psychiatric/Behavioral: The patient is nervous/anxious.   All other systems reviewed and are negative.     VITAL SIGNS    Temp:  [97.8 F (36.6 C)-99.7 F (37.6 C)] 98.9 F (37.2 C) (01/11 0945) Pulse Rate:  [63-86] 76 (01/11 1351) Resp:  [16-26] 18 (01/11 1351) BP: (100-151)/(65-94) 124/82 mmHg  (01/11 1315) SpO2:  [88 %-99 %] 98 % (01/11 1351) FiO2 (%):  [44 %-45 %] 45 % (01/11 1351) Weight:  [160 lb (72.576 kg)-164 lb 10.9 oz (74.7 kg)] 164 lb 10.9 oz (74.7 kg) (01/11 0945) HEMODYNAMICS:   VENTILATOR SETTINGS: Vent Mode:  [-]  FiO2 (%):  [44 %-45 %] 45 % INTAKE / OUTPUT:  Intake/Output Summary (Last 24 hours) at 02/13/15 1505 Last data filed at 02/13/15 0800  Gross per 24 hour  Intake      0 ml  Output      0 ml  Net      0 ml       PHYSICAL EXAM   Physical Exam  Constitutional: She is oriented to person, place, and time. She appears distressed.  HENT:  Head: Normocephalic and atraumatic.  Eyes: EOM are normal. Pupils are equal, round, and reactive to light. No scleral icterus.  Neck: Normal range of motion. Neck supple.  Cardiovascular: Normal rate, regular rhythm and normal heart sounds.   No murmur heard. Pulmonary/Chest: No stridor. She is in respiratory distress. She has wheezes. She has rales.  Abdominal: Soft. Bowel sounds are normal.  Musculoskeletal: Normal range of motion. She exhibits no edema.  Neurological: She is alert and oriented to person, place, and time.  Skin: Skin is warm. She is diaphoretic.  Psychiatric: She has a normal mood and affect.       LABS   LABS:  CBC  Recent Labs Lab 02/12/15 1427 02/13/15 0638  WBC 5.2 4.9  HGB 8.9* 9.4*  HCT 27.5* 28.8*  PLT 118* 105*   Coag's No results for input(s): APTT, INR in the last 168 hours. BMET  Recent Labs Lab 02/12/15 1427 02/13/15 0638  NA 139 142  K 5.8* 4.1  CL 101 101  CO2 23 33*  BUN 71* 26*  CREATININE 14.14* 6.89*  GLUCOSE 85 90   Electrolytes  Recent Labs Lab 02/12/15 1427 02/13/15 0638  CALCIUM 6.5* 7.4*   Sepsis Markers No results for input(s): LATICACIDVEN, PROCALCITON, O2SATVEN in the last 168 hours. ABG No results for input(s): PHART, PCO2ART, PO2ART in the last 168 hours. Liver Enzymes  Recent Labs Lab 02/12/15 1427  AST 23  ALT 17   ALKPHOS 55  BILITOT 0.8  ALBUMIN 3.3*   Cardiac Enzymes  Recent Labs Lab 02/12/15 1427  TROPONINI <0.03   Glucose No results for input(s): GLUCAP in the last 168 hours.   No results found for this or any previous visit (from the past 240 hour(s)).   Current facility-administered medications:  .  acetaminophen (TYLENOL) tablet 650 mg, 650 mg, Oral, Q6H PRN, 650 mg at 02/12/15 1829 **OR** acetaminophen (TYLENOL) suppository 650 mg, 650 mg, Rectal, Q6H PRN, Houston SirenVivek J Sainani, MD .  albuterol (PROVENTIL) (2.5 MG/3ML) 0.083% nebulizer solution 2.5 mg, 2.5 mg, Nebulization, Q6H PRN, Houston SirenVivek J Sainani, MD, 2.5 mg at 02/13/15 1252 .  budesonide (PULMICORT) nebulizer solution 0.25 mg, 0.25 mg, Nebulization, BID, Arnice Vanepps,  MD .  calcium carbonate (TUMS - dosed in mg elemental calcium) chewable tablet 200 mg of elemental calcium, 1 tablet, Oral, TID, Houston Siren, MD, 200 mg of elemental calcium at 02/13/15 0311 .  carvedilol (COREG) tablet 25 mg, 25 mg, Oral, BID WC, Crissie Figures, MD, 25 mg at 02/13/15 0708 .  cinacalcet (SENSIPAR) tablet 90 mg, 90 mg, Oral, QPC supper, Houston Siren, MD, 90 mg at 02/13/15 0310 .  heparin injection 5,000 Units, 5,000 Units, Subcutaneous, Q12H, [name] Mody, MD .  ipratropium-albuterol (DUONEB) 0.5-2.5 (3) MG/3ML nebulizer solution 3 mL, 3 mL, Nebulization, Q4H, [name] Mody, MD .  methylPREDNISolone sodium succinate (SOLU-MEDROL) 125 mg/2 mL injection 60 mg, 60 mg, Intravenous, Q12H, [name] Mody, MD, 60 mg at 02/13/15 1300 .  NIFEdipine (PROCARDIA XL/ADALAT-CC) 24 hr tablet 90 mg, 90 mg, Oral, Daily, Crissie Figures, MD, 90 mg at 02/13/15 1000 .  ondansetron (ZOFRAN) tablet 4 mg, 4 mg, Oral, Q6H PRN **OR** ondansetron (ZOFRAN) injection 4 mg, 4 mg, Intravenous, Q6H PRN, Houston Siren, MD, 4 mg at 02/13/15 0537  IMAGING   CXR 1/10 images reveiwed 02/13/2015     MICRO DATA: MRSA PCR  Urine  Blood Resp   ANTIMICROBIALS:   Meropenem/Vanc-1/11    ASSESSMENT/PLAN  50 yo AAF with acute resp failure from acute COPD exacerbation with acute pulm edema with sepsis with previous h/o severe obstrictive lung disease and ILD with COP  PULMONARY 1.Respiratory Failure -high risk for intubation-will continue biPAP therapy for now iPAP 16 fio2 70%  -continue Bronchodilator Therapy -start IV steroids -started ABX  CARDIOVASCULAR Labile BP c/w sepsis -will use pressors as needed  RENAL ESRD on HD -follow up nephology recs  GASTROINTESTINAL Keep NPO  HEMATOLOGIC Follow CBC  INFECTIOUS Empiric abx therapy for   ENDOCRINE - ICU hypoglycemic\Hyperglycemia protocol   NEUROLOGIC Avoid heavy sedatives    I have personally obtained a history, examined the patient, evaluated laboratory and independently reviewed  imaging results, formulated the assessment and plan and placed orders.  The Patient requires high complexity decision making for assessment and support, frequent evaluation and titration of therapies, application of advanced monitoring technologies and extensive interpretation of multiple databases. Critical Care Time devoted to patient care services described in this note is 45 minutes.   Overall, patient is critically ill, prognosis is guarded. Patient at high risk for cardiac arrest and death.    Lucie Leather, M.D.  [facility]  Medical Director [facility] Medical Director [facility] Cardio-Pulmonary Department

## 2015-02-13 NOTE — Progress Notes (Signed)
Post HD Tx Notes 

## 2015-02-13 NOTE — Therapy (Signed)
SpO2 gradually dropping when patient fell asleep. FiO2 titrated up to .80 to obtain SpO2 greater than 92%. Dr.Kasa at bedside. Patient wakes easily with subsequent improvement in SpO2. Patient transported to CCU 7 via V-60. HR and SpO2 stable for transport. Report given to [facility]Fredia RRT. FiO2 decreased to .70 upon admission to CCU.

## 2015-02-13 NOTE — Discharge Summary (Deleted)
[facility] - [place] at [facility]   PATIENT NAME: Brittany Ramirez    MR#:  [id number]  DATE OF BIRTH:  16-Aug-1965  DATE OF ADMISSION:  02/12/2015 ADMITTING PHYSICIAN: Houston Siren, MD  DATE OF DISCHARGE: 02/13/2015  PRIMARY CARE PHYSICIAN: MOTTL,AMY, MD    ADMISSION DIAGNOSIS:  Acute pulmonary edema (HCC) [J81.0] Hypoxia [R09.02]  DISCHARGE DIAGNOSIS:  Active Problems:   CHF (congestive heart failure) (HCC)   SECONDARY DIAGNOSIS:   Past Medical History  Diagnosis Date  . End stage renal disease (HCC)   . Restrictive lung disease   . COPD (chronic obstructive pulmonary disease) (HCC)   . Essential hypertension   . Cryptogenic organizing pneumonia [facility])     HOSPITAL COURSE:   50 year old female with end-stage renal disease on hemodialysis, cryptogenic organizing pneumonia and essential hypertension who presented with shortness of breath and hypotension. For further details please refer the H&P.  1. Shortness of breath with hypoxia and acute respiratory failure: This is due to mild CHF from diastolic heart failure from end-stage renal disease. She received dialysis while in the hospital. She will need oxygen at discharge primarily due to her underlying chronic lung disease.  2. Hypotension: This was due to her medications that she was taking for her cold. Her blood pressures improved. She had an echocardiogram about a year ago that showed pulmonary hypertension. She can resume her outpatient nifedipine and Coreg.  3. End-stage renal disease and hematemesis: Patient will resume dialysis. Patient did receive dose from hospital.  4. Acute on chronic diastolic heart failure: Patient underwent dialysis and her symptoms have improved.  5. History of cryptogenic organizing pneumonia with strict of lung disease: Patient is followed at [facility] pulmonary. Continue maintenance of prednisone. She was assessed for home oxygen prior to discharge.  DISCHARGE  CONDITIONS AND DIET:  Patient stable for discharge on a renal diet  CONSULTS OBTAINED:  Treatment Team:  Mosetta Pigeon, MD  DRUG ALLERGIES:   Allergies  Allergen Reactions  . Iron Anaphylaxis  . Peanuts [Peanut Oil] Other (See Comments)    Reaction:  Unknown   . Penicillins Shortness Of Breath and Other (See Comments)    Has patient had a PCN reaction causing immediate rash, facial/tongue/throat swelling, SOB or lightheadedness with hypotension: Yes Has patient had a PCN reaction causing severe rash involving mucus membranes or skin necrosis: No Has patient had a PCN reaction that required hospitalization No Has patient had a PCN reaction occurring within the last 10 years: No If all of the above answers are "NO", then may proceed with Cephalosporin use.  Marland Kitchen Potassium-Containing Compounds Shortness Of Breath and Other (See Comments)    Dialysis solution with 4K (per record).   Doylene Bode [Sulfamethoxazole-Trimethoprim] Shortness Of Breath  . Skelaxin [Metaxalone] Shortness Of Breath  . Erythromycin Rash  . Iodine Other (See Comments)    Reaction:  Unknown     DISCHARGE MEDICATIONS:   Current Discharge Medication List    CONTINUE these medications which have NOT CHANGED   Details  albuterol (PROVENTIL HFA;VENTOLIN HFA) 108 (90 Base) MCG/ACT inhaler Inhale 2 puffs into the lungs every 6 (six) hours as needed for wheezing or shortness of breath.    calcium carbonate (TUMS - DOSED IN MG ELEMENTAL CALCIUM) 500 MG chewable tablet Chew 1 tablet by mouth 3 (three) times daily.    carvedilol (COREG) 25 MG tablet Take 25 mg by mouth 2 (two) times daily.    cinacalcet (SENSIPAR) 30 MG tablet Take  90 mg by mouth daily.    NIFEdipine (PROCARDIA XL/ADALAT-CC) 90 MG 24 hr tablet Take 90 mg by mouth daily.    predniSONE (DELTASONE) 10 MG tablet Take 10 mg by mouth daily.              Today   CHIEF COMPLAINT:  Patient is doing well this morning with exception of some nausea  from the medication she took an empty stomach. She has a headache which she thinks is from her blood pressure.   VITAL SIGNS:  Blood pressure 137/80, pulse 86, temperature 99.7 F (37.6 C), temperature source Oral, resp. rate 24, height 5\' 4"  (1.626 m), weight 72.576 kg (160 lb), last menstrual period 02/07/2015, SpO2 99 %.   REVIEW OF SYSTEMS:  Review of Systems  Constitutional: Negative for fever, chills and malaise/fatigue.  HENT: Negative for sore throat.   Eyes: Negative for blurred vision.  Respiratory: Negative for cough, hemoptysis, shortness of breath and wheezing.   Cardiovascular: Negative for chest pain, palpitations and leg swelling.  Gastrointestinal: Positive for nausea (Reports she took medicine on an empty stomach.). Negative for vomiting, abdominal pain, diarrhea and blood in stool.  Genitourinary: Negative for dysuria.  Musculoskeletal: Negative for back pain.  Neurological: Positive for headaches. Negative for dizziness and tremors.  Endo/Heme/Allergies: Does not bruise/bleed easily.     PHYSICAL EXAMINATION:  GENERAL:  50 y.o.-year-old patient lying in the bed with no acute distress.  NECK:  Supple, no jugular venous distention. No thyroid enlargement, no tenderness.  LUNGS: Normal breath sounds bilaterally, no wheezing, rales,rhonchi  No use of accessory muscles of respiration.  CARDIOVASCULAR: S1, S2 normal. No murmurs, rubs, or gallops.  ABDOMEN: Soft, non-tender, non-distended. Bowel sounds present. No organomegaly or mass.  EXTREMITIES: No pedal edema, cyanosis, or clubbing.  PSYCHIATRIC: The patient is alert and oriented x 3.  SKIN: No obvious rash, lesion, or ulcer.   DATA REVIEW:   CBC  Recent Labs Lab 02/13/15 0638  WBC 4.9  HGB 9.4*  HCT 28.8*  PLT 105*    Chemistries   Recent Labs Lab 02/12/15 1427 02/13/15 0638  NA 139 142  K 5.8* 4.1  CL 101 101  CO2 23 33*  GLUCOSE 85 90  BUN 71* 26*  CREATININE 14.14* 6.89*  CALCIUM 6.5*  7.4*  AST 23  --   ALT 17  --   ALKPHOS 55  --   BILITOT 0.8  --     Cardiac Enzymes  Recent Labs Lab 02/12/15 1427  TROPONINI <0.03    Microbiology Results  @[email or web address] @  RADIOLOGY:  Dg Chest 2 View  02/12/2015  CLINICAL DATA:  Shortness of breath EXAM: CHEST  2 VIEW COMPARISON:  10/21/2009 FINDINGS: Symmetric perihilar interstitial and airspace opacity compatible with edema. Small pleural effusions bilaterally. Chronic cardiopericardial enlargement and aortic tortuosity. Chronic hyperinflation. Remote bilateral rib fractures. Brachiocephalic vein stenting on the right. 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 IMPRESSION: CHF Electronically Signed   By: Marnee SpringJonathon  Watts M.D.   On: 02/12/2015 15:51      Management plans discussed with the patient and she is in agreement. Stable for discharge home  Patient should follow up with PCP in one week  CODE STATUS:     Code Status Orders        Start     Ordered   02/12/15 1804  Full code   Continuous     02/12/15 1803    Code Status History  Date Active Date Inactive Code Status Order ID Comments User Context   This patient has a current code status but no historical code status.    Advance Directive Documentation        Most Recent Value   Type of Advance Directive  Healthcare Power of Attorney   Pre-existing out of facility DNR order (yellow form or pink MOST form)     "MOST" Form in Place?        TOTAL TIME TAKING CARE OF THIS PATIENT: 35 minutes.    Note: This dictation was prepared with Dragon dictation along with smaller phrase technology. Any transcriptional errors that result from this process are unintentional.  Rael Yo M.D on 02/13/2015 at 10:36 AM  Between 7am to 6pm - Pager - [phone] After 6pm go to [email or web address] - password [email or web address] [facility]  [facility]  Office  [phone]  CC: Primary care physician; Trey Sailors, MD

## 2015-02-13 NOTE — Therapy (Signed)
Attempted patient on [place] at 6 lpm after dialysis completed. 500 cc removed per HD nurse. In less than 5 min patient reported significant SOB and requested to go back on BiPAP. Patient placed back on BiPAP at previous settings. Work of breathing improved, continues  to have some mild SOB. Charge Nurse Pam informed of same. Nurse Annabella also notified and asked her to obtain an order to transfer the patient to CCU per hospital policy for BiPAP.

## 2015-02-13 NOTE — Therapy (Signed)
Responded to Rapid Response. Patient found on dialysis with [place] at 3 lpm. SpO2 88-92%, super clavicular retractions noted with accessory muscle use. Patient alert and responsive C/O SOB. Bilat breath sounds reveal coarse crackles at nipple line. PRN neb given. Dr.Mody notified of status change. Order received for BiPAP. Patient stated she had been on BiPAP before and it helped. Placed on BiPAP as documented. Work of breathing improved, continues to have slight retractions, patient stated that she was breathing much better. SpO2 increased to 94%. Charge nurse at bedside and is aware that patient may need to go to CCU due to BiPAP. This RRT will stay at bedside until HD is complete. Dialysis nurse attempting to pull fluid dependent upon patient's B./P.

## 2015-02-13 NOTE — Significant Event (Signed)
Rapid Response Event Note  Overview:  Pt currently receiving hemodialysis. Pt stated she was starting to feel SOB and a rapid response was called.     Initial Focused Assessment: Pt was noticeably short of breath. Pt using accessory muscles while breathing. When lungs were auscultated pt was noted to have rales bilaterally.  Interventions: Pt oxygen was increased to 6 liters via nasal cannula and an albuterol nebulizer treatment was given. Dr. Juliene PinaMody was called and solumedrol was ordered and pt was placed on BI-PAP. Dr. Thedore MinsSingh was called and 30 more minutes was ordered to remove another 500 mL's through dialysis. Pt will be reassessed after the 30 minutes to determine whether the pt needs to stay on BI-PAP or she can go back to her regular unit.   Event Summary:  Pt remained at  hemodialysis.     at          Brittany Ramirez

## 2015-02-13 NOTE — Progress Notes (Signed)
Subjective:  Late entry: Towards the end of dialysis, patient became extremely short of breath Effort as follows was called Patient was placed on BiPAP due to low oxygen saturation Patient was resisting fluid removal but agreed to 0.5 kg   Objective:  Vital signs in last 24 hours:  Temp:  [97.8 F (36.6 C)-99.7 F (37.6 C)] 98.9 F (37.2 C) (01/11 0945) Pulse Rate:  [63-86] 70 (01/11 1557) Resp:  [14-26] 14 (01/11 1557) BP: (104-151)/(65-95) 113/79 mmHg (01/11 1400) SpO2:  [85 %-100 %] 96 % (01/11 1600) FiO2 (%):  [44 %-60 %] 60 % (01/11 1600) Weight:  [72.576 kg (160 lb)-74.7 kg (164 lb 10.9 oz)] 74.7 kg (164 lb 10.9 oz) (01/11 0945)  Weight change:  Filed Weights   02/12/15 2100 02/13/15 0030 02/13/15 0945  Weight: 72.576 kg (160 lb) 72.576 kg (160 lb) 74.7 kg (164 lb 10.9 oz)    Intake/Output:    Intake/Output Summary (Last 24 hours) at 02/13/15 1732 Last data filed at 02/13/15 0800  Gross per 24 hour  Intake      0 ml  Output      0 ml  Net      0 ml     Physical Exam: General: Critically ill-appearing  HEENT BiPAP mask present  Neck supple  Pulm/lungs Diffuse bilateral crackles  CVS/Heart Tachycardic, irregular, systolic murmur present  Abdomen:  Soft, nontender  Extremities: + peripheral edema  Neurologic: alert, follows commands  Skin: No acute rashes  Access: Aneurysm of fistula       Basic Metabolic Panel:   Recent Labs Lab 02/12/15 1427 02/13/15 0638  NA 139 142  K 5.8* 4.1  CL 101 101  CO2 23 33*  GLUCOSE 85 90  BUN 71* 26*  CREATININE 14.14* 6.89*  CALCIUM 6.5* 7.4*     CBC:  Recent Labs Lab 02/12/15 1427 02/13/15 0638  WBC 5.2 4.9  HGB 8.9* 9.4*  HCT 27.5* 28.8*  MCV 88.9 88.3  PLT 118* 105*      Microbiology:  Recent Results (from the past 720 hour(s))  MRSA PCR Screening     Status: None   Collection Time: 02/13/15  3:22 PM  Result Value Ref Range Status   MRSA by PCR NEGATIVE NEGATIVE Final    Comment:         The GeneXpert MRSA Assay (FDA approved for NASAL specimens only), is one component of a comprehensive MRSA colonization surveillance program. It is not intended to diagnose MRSA infection nor to guide or monitor treatment for MRSA infections.     Coagulation Studies: No results for input(s): LABPROT, INR in the last 72 hours.  Urinalysis: No results for input(s): COLORURINE, LABSPEC, PHURINE, GLUCOSEU, HGBUR, BILIRUBINUR, KETONESUR, PROTEINUR, UROBILINOGEN, NITRITE, LEUKOCYTESUR in the last 72 hours.  Invalid input(s): APPERANCEUR    Imaging: Dg Chest 1 View  02/13/2015  CLINICAL DATA:  Sob. Patient was in dialysis treatment and exhibited respiratory distress. End stage renal disease. Restrictive lung disease. Copd. Cryptogenic organizing pneumonia. Former smoker. EXAM: CHEST 1 VIEW COMPARISON:  02/12/2015 FINDINGS: Heart is enlarged. There is persistent diffuse airspace filling bilaterally, consistent with pulmonary edema. Superimposed infectious process could have a similar appearance. Stable appearance of right brachiocephalic vein stent. Numerous remote rib fractures. IMPRESSION: Cardiomegaly and changes consistent with pulmonary edema. Electronically Signed   By: Norva Pavlov M.D.   On: 02/13/2015 15:59   Dg Chest 2 View  02/12/2015  CLINICAL DATA:  Shortness of breath EXAM: CHEST  2  VIEW COMPARISON:  10/21/2009 FINDINGS: Symmetric perihilar interstitial and airspace opacity compatible with edema. Small pleural effusions bilaterally. Chronic cardiopericardial enlargement and aortic tortuosity. Chronic hyperinflation. Remote bilateral rib fractures. Brachiocephalic vein stenting on the right. 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 IMPRESSION: CHF Electronically Signed   By: Marnee SpringJonathon  Watts M.D.   On: 02/12/2015 15:51     Medications:     . budesonide (PULMICORT) nebulizer solution  0.25 mg Nebulization BID  . calcium carbonate  1  tablet Oral TID  . carvedilol  25 mg Oral BID WC  . cinacalcet  90 mg Oral QPC supper  . heparin subcutaneous  5,000 Units Subcutaneous Q12H  . ipratropium-albuterol  3 mL Nebulization Q4H  . meropenem (MERREM) IV  500 mg Intravenous Once  . [START ON 02/14/2015] meropenem (MERREM) IV  500 mg Intravenous Q24H  . methylPREDNISolone (SOLU-MEDROL) injection  60 mg Intravenous Q12H  . NIFEdipine  90 mg Oral Daily  . vancomycin  1,750 mg Intravenous Once  . [START ON 02/14/2015] vancomycin  750 mg Intravenous Q T,Th,Sa-HD   acetaminophen **OR** acetaminophen, albuterol, ondansetron **OR** ondansetron (ZOFRAN) IV  Assessment/ Plan:  50 y.o. female with a PMHX of end-stage renal disease, on dialysis since 2007. [facility]. Transplant nephrectomy 2007. History of interstitial lung disease, maintained on prednisone 10 mg daily, pulmonary hypertension, left hip necrosis, was admitted on 02/12/2015 with hypotension and shortness of breath.   1. End-stage renal disease. [facility]. [facility]Thursday/[facility]. 3 hours. Self cannulation.  2. Shortness of breath. Worsening of interstitial lung disease versus pulmonary edema  3. Pulmonary hypertension  4. Hypotension  5. Hyperkalemia   plan: Patient has underlying pulmonary hypertension and interstitial lung disease which makes her intravascular volume assessment rather difficult. Pulmonary crackles could be due to interstitial lung disease or Acute Pulm edema She's not allowing fluid removal with dialysis but renegotiated 0.5 kg today She had to be placed on noninvasive positive pressure ventilation for oxygenation support She will be transferred to ICU Further plan as  Hospital course progresses      LOS: 1 Zemira Zehring 1/11/20175:32 PM

## 2015-02-13 NOTE — Progress Notes (Signed)
Post HD Tx Assessment  

## 2015-02-13 NOTE — Progress Notes (Signed)
Patient with increasing work of breathing and needs BIPAP while in HD. Patient will need to be tx to step down. Started on IV steroids, Levaquin and nebs/inhalers Repeat ECHO. CXR and ABG

## 2015-02-13 NOTE — Progress Notes (Signed)
ANTIBIOTIC CONSULT NOTE - INITIAL  Pharmacy Consult for Vancomycin/Meropenem Dosing Indication: rule out pneumonia  Allergies  Allergen Reactions  . Iron Anaphylaxis  . Peanuts [Peanut Oil] Other (See Comments)    Reaction:  Unknown   . Penicillins Shortness Of Breath and Other (See Comments)    Has patient had a PCN reaction causing immediate rash, facial/tongue/throat swelling, SOB or lightheadedness with hypotension: Yes Has patient had a PCN reaction causing severe rash involving mucus membranes or skin necrosis: No Has patient had a PCN reaction that required hospitalization No Has patient had a PCN reaction occurring within the last 10 years: No If all of the above answers are "NO", then may proceed with Cephalosporin use.  Marland Kitchen. Potassium-Containing Compounds Shortness Of Breath and Other (See Comments)    Dialysis solution with 4K (per record).   Doylene Bode. Septra [Sulfamethoxazole-Trimethoprim] Shortness Of Breath  . Skelaxin [Metaxalone] Shortness Of Breath  . Erythromycin Rash  . Iodine Other (See Comments)    Reaction:  Unknown     Patient Measurements: Height: 5\' 4"  (162.6 cm) Weight: 164 lb 3.9 oz (74.5 kg) IBW/kg (Calculated) : 54.7   Vital Signs: Temp: 97.7 F (36.5 C) (01/11 1345) Temp Source: Axillary (01/11 1345) BP: 115/79 mmHg (01/11 1800) Pulse Rate: 78 (01/11 2017) Intake/Output from previous day:   Intake/Output from this shift:    Labs:  Recent Labs  02/12/15 1427 02/13/15 0638  WBC 5.2 4.9  HGB 8.9* 9.4*  PLT 118* 105*  CREATININE 14.14* 6.89*   Estimated Creatinine Clearance: 10 mL/min (by C-G formula based on Cr of 6.89). No results for input(s): VANCOTROUGH, VANCOPEAK, VANCORANDOM, GENTTROUGH, GENTPEAK, GENTRANDOM, TOBRATROUGH, TOBRAPEAK, TOBRARND, AMIKACINPEAK, AMIKACINTROU, AMIKACIN in the last 72 hours.   Microbiology: Recent Results (from the past 720 hour(s))  MRSA PCR Screening     Status: None   Collection Time: 02/13/15  3:22 PM   Result Value Ref Range Status   MRSA by PCR NEGATIVE NEGATIVE Final    Comment:        The GeneXpert MRSA Assay (FDA approved for NASAL specimens only), is one component of a comprehensive MRSA colonization surveillance program. It is not intended to diagnose MRSA infection nor to guide or monitor treatment for MRSA infections.     Medical History: Past Medical History  Diagnosis Date  . End stage renal disease (HCC)   . Restrictive lung disease   . COPD (chronic obstructive pulmonary disease) (HCC)   . Essential hypertension   . Cryptogenic organizing pneumonia (HCC)     Medications:  Scheduled:  . budesonide (PULMICORT) nebulizer solution  0.25 mg Nebulization BID  . calcium carbonate  1 tablet Oral TID  . carvedilol  25 mg Oral BID WC  . cinacalcet  90 mg Oral QPC supper  . heparin subcutaneous  5,000 Units Subcutaneous Q12H  . ipratropium-albuterol  3 mL Nebulization Q4H  . [START ON 02/14/2015] meropenem (MERREM) IV  500 mg Intravenous Q24H  . methylPREDNISolone (SOLU-MEDROL) injection  60 mg Intravenous Q12H  . NIFEdipine  90 mg Oral Daily  . [START ON 02/14/2015] vancomycin  750 mg Intravenous Q T,Th,Sa-HD   Infusions:   Assessment: Pharmacy consulted to dose vancomycin and meropenem for 50 yo female patient being treated for respiratory failure. Patient had dialysis on 1/11 and receives regularly scheduled Tuesday, Thursday, Friday.   Plan:  1. Vancomycin: Will order loading dose of vancomycin 1750mg  IV x 1. Will schedule vancomycin 750mg  QDialysis for goal trough of 15-25. Will obtain  trough prior to 3rd dialysis session    2. Meropenem: Will start patient on meropenem 500mg  IV Q24hr.   Pharmacy will continue to monitor and adjust per consult.    Simpson,Michael L 02/13/2015,9:27 PM

## 2015-02-13 NOTE — Progress Notes (Signed)
HD TX Initiation 

## 2015-02-13 NOTE — Progress Notes (Signed)
HD Tx Termination 

## 2015-02-13 NOTE — Discharge Instructions (Signed)
Heart Failure Clinic appointment on March 08, 2015 at 10:00am with Clarisa Kindredina Petrea Fredenburg, FNP. Please call [phone] to reschedule.

## 2015-02-13 NOTE — Progress Notes (Signed)
Initial appointment scheduled at the Heart Failure Clinic on March 08, 2015 at 10:00am. Thank you.

## 2015-02-13 NOTE — Progress Notes (Signed)
°   02/13/15 1300  Clinical Encounter Type  Visited With Patient;Health care provider  Visit Type Code  Referral From Nurse  Consult/Referral To Chaplain  Spiritual Encounters  Spiritual Needs Emotional  Stress Factors  Patient Stress Factors Health changes  Chaplain responded to a medical alert in the dialysis unit.  Offered a compassionate presence and support as needed. Chaplain [employer]elyn Crews Ext [phone]

## 2015-02-13 NOTE — Progress Notes (Signed)
Pre HD Tx Assessment 

## 2015-02-13 NOTE — Progress Notes (Signed)
Pre HD Tx machine & patient checks

## 2015-02-13 NOTE — Progress Notes (Addendum)
Patient was in dialysis treatment and exhibited respiratory distress. Patient is alert and oriented.  RRT called in and treatment initiated. MD notified and ordered to transfer patient to step down.  [facility].

## 2015-02-13 NOTE — Care Management (Signed)
Patient transferred to stepdown from dialysis unit due to shortness of breath and hypotension.  At present she is requiring continuous bipap.  notified dialysis coordinator

## 2015-02-14 ENCOUNTER — Inpatient Hospital Stay: Admit: 2015-02-14 | Payer: Medicare Other

## 2015-02-14 LAB — CBC
HCT: 27.5 % — ABNORMAL LOW (ref 35.0–47.0)
HEMOGLOBIN: 8.7 g/dL — AB (ref 12.0–16.0)
MCH: 28.3 pg (ref 26.0–34.0)
MCHC: 31.7 g/dL — AB (ref 32.0–36.0)
MCV: 89.2 fL (ref 80.0–100.0)
PLATELETS: 98 10*3/uL — AB (ref 150–440)
RBC: 3.09 MIL/uL — AB (ref 3.80–5.20)
RDW: 15.1 % — ABNORMAL HIGH (ref 11.5–14.5)
WBC: 3 10*3/uL — ABNORMAL LOW (ref 3.6–11.0)

## 2015-02-14 LAB — BASIC METABOLIC PANEL
ANION GAP: 6 (ref 5–15)
BUN: 19 mg/dL (ref 6–20)
CALCIUM: 6.8 mg/dL — AB (ref 8.9–10.3)
CO2: 31 mmol/L (ref 22–32)
CREATININE: 5.22 mg/dL — AB (ref 0.44–1.00)
Chloride: 102 mmol/L (ref 101–111)
GFR, EST AFRICAN AMERICAN: 10 mL/min — AB (ref >60)
GFR, EST NON AFRICAN AMERICAN: 9 mL/min — AB (ref >60)
Glucose, Bld: 137 mg/dL — ABNORMAL HIGH (ref 65–99)
Potassium: 4.3 mmol/L (ref 3.5–5.1)
Sodium: 139 mmol/L (ref 135–145)

## 2015-02-14 LAB — HEPATITIS B SURFACE ANTIBODY, QUANTITATIVE: HEPATITIS B-POST: 658.8 m[IU]/mL

## 2015-02-14 LAB — HEPATITIS B SURFACE ANTIGEN: HEP B S AG: NEGATIVE

## 2015-02-14 MED ORDER — MOMETASONE FURO-FORMOTEROL FUM 100-5 MCG/ACT IN AERO
2.0000 | INHALATION_SPRAY | Freq: Two times a day (BID) | RESPIRATORY_TRACT | Status: DC
  Filled 2015-02-14: qty 8.8

## 2015-02-14 MED ORDER — PREDNISONE 20 MG PO TABS
40.0000 mg | ORAL_TABLET | Freq: Every day | ORAL | Status: DC
  Filled 2015-02-14: qty 2

## 2015-02-14 MED ORDER — TIOTROPIUM BROMIDE MONOHYDRATE 18 MCG IN CAPS
18.0000 ug | ORAL_CAPSULE | Freq: Every day | RESPIRATORY_TRACT | Status: DC
  Filled 2015-02-14: qty 5

## 2015-02-14 MED ORDER — CALCIUM CARBONATE ANTACID 500 MG PO CHEW
2.0000 | CHEWABLE_TABLET | Freq: Three times a day (TID) | ORAL | Status: DC
  Administered 2015-02-14 (×2): 400 mg via ORAL
  Filled 2015-02-14 (×3): qty 2

## 2015-02-14 MED ORDER — ALBUTEROL SULFATE (2.5 MG/3ML) 0.083% IN NEBU
2.5000 mg | INHALATION_SOLUTION | RESPIRATORY_TRACT | Status: DC
  Administered 2015-02-14 – 2015-02-15 (×5): 2.5 mg via RESPIRATORY_TRACT
  Filled 2015-02-14 (×7): qty 3

## 2015-02-14 NOTE — Progress Notes (Signed)
   02/14/15 1200  Clinical Encounter Type  Visited With Patient and family together  Visit Type Follow-up  Referral From Patient  Consult/Referral To Chaplain  Spiritual Encounters  Spiritual Needs Emotional  Stress Factors  Patient Stress Factors Not reviewed  Chaplain rounded in the unit and offered a compassionate presence after patient called out. She remembered me from her medical alert code in dialysis from yesterday. She expressed she felt better today and that the Lord was good. Chaplain Viraaj Vorndran A. Bevin Das Ext. [phone]

## 2015-02-14 NOTE — Progress Notes (Signed)
Subjective:  Patient feels better this AM Off of NIPPV 6 L [place] at present refusing renal diet Wants only regular diet   Objective:  Vital signs in last 24 hours:  Temp:  [97.7 F (36.5 C)-100.1 F (37.8 C)] 100.1 F (37.8 C) (01/12 0200) Pulse Rate:  [70-83] 77 (01/12 0810) Resp:  [14-27] 20 (01/12 0810) BP: (99-173)/(63-104) 160/93 mmHg (01/12 0810) SpO2:  [85 %-100 %] 93 % (01/12 0821) FiO2 (%):  [44 %-60 %] 44 % (01/12 0821) Weight:  [74.5 kg (164 lb 3.9 oz)-74.7 kg (164 lb 10.9 oz)] 74.5 kg (164 lb 3.9 oz) (01/11 1345)  Weight change: 1.7 kg (3 lb 12 oz) Filed Weights   02/13/15 0030 02/13/15 0945 02/13/15 1345  Weight: 72.576 kg (160 lb) 74.7 kg (164 lb 10.9 oz) 74.5 kg (164 lb 3.9 oz)    Intake/Output:    Intake/Output Summary (Last 24 hours) at 02/14/15 0910 Last data filed at 02/14/15 0328  Gross per 24 hour  Intake    240 ml  Output    500 ml  Net   -260 ml     Physical Exam: General: Chronically ill-appearing  HEENT [place] o2  Neck supple  Pulm/lungs Diffuse bilateral crackles  CVS/Heart regular, systolic murmur present  Abdomen:  Soft, nontender  Extremities: + peripheral edema  Neurologic: alert, follows commands  Skin: No acute rashes  Access: Aneurysmal fistula       Basic Metabolic Panel:   Recent Labs Lab 02/12/15 1427 02/13/15 0638 02/14/15 0629  NA 139 142 139  K 5.8* 4.1 4.3  CL 101 101 102  CO2 23 33* 31  GLUCOSE 85 90 137*  BUN 71* 26* 19  CREATININE 14.14* 6.89* 5.22*  CALCIUM 6.5* 7.4* 6.8*     CBC:  Recent Labs Lab 02/12/15 1427 02/13/15 0638 02/14/15 0629  WBC 5.2 4.9 3.0*  HGB 8.9* 9.4* 8.7*  HCT 27.5* 28.8* 27.5*  MCV 88.9 88.3 89.2  PLT 118* 105* 98*      Microbiology:  Recent Results (from the past 720 hour(s))  MRSA PCR Screening     Status: None   Collection Time: 02/13/15  3:22 PM  Result Value Ref Range Status   MRSA by PCR NEGATIVE NEGATIVE Final    Comment:        The GeneXpert MRSA Assay  (FDA approved for NASAL specimens only), is one component of a comprehensive MRSA colonization surveillance program. It is not intended to diagnose MRSA infection nor to guide or monitor treatment for MRSA infections.     Coagulation Studies: No results for input(s): LABPROT, INR in the last 72 hours.  Urinalysis: No results for input(s): COLORURINE, LABSPEC, PHURINE, GLUCOSEU, HGBUR, BILIRUBINUR, KETONESUR, PROTEINUR, UROBILINOGEN, NITRITE, LEUKOCYTESUR in the last 72 hours.  Invalid input(s): APPERANCEUR    Imaging: Dg Chest 1 View  02/13/2015  CLINICAL DATA:  Sob. Patient was in dialysis treatment and exhibited respiratory distress. End stage renal disease. Restrictive lung disease. Copd. Cryptogenic organizing pneumonia. Former smoker. EXAM: CHEST 1 VIEW COMPARISON:  02/12/2015 FINDINGS: Heart is enlarged. There is persistent diffuse airspace filling bilaterally, consistent with pulmonary edema. Superimposed infectious process could have a similar appearance. Stable appearance of right brachiocephalic vein stent. Numerous remote rib fractures. IMPRESSION: Cardiomegaly and changes consistent with pulmonary edema. Electronically Signed   By: Norva Pavlov M.D.   On: 02/13/2015 15:59   Dg Chest 2 View  02/12/2015  CLINICAL DATA:  Shortness of breath EXAM: CHEST  2 VIEW COMPARISON:  10/21/2009 FINDINGS: Symmetric perihilar interstitial and airspace opacity compatible with edema. Small pleural effusions bilaterally. Chronic cardiopericardial enlargement and aortic tortuosity. Chronic hyperinflation. Remote bilateral rib fractures. Brachiocephalic vein stenting on the right. 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 IMPRESSION: CHF Electronically Signed   By: Marnee SpringJonathon  Watts M.D.   On: 02/12/2015 15:51     Medications:     . antiseptic oral rinse  7 mL Mouth Rinse q12n4p  . budesonide (PULMICORT) nebulizer solution  0.25 mg Nebulization BID   . calcium carbonate  1 tablet Oral TID  . carvedilol  25 mg Oral BID WC  . chlorhexidine  15 mL Mouth Rinse BID  . cinacalcet  90 mg Oral QPC supper  . heparin subcutaneous  5,000 Units Subcutaneous Q12H  . ipratropium-albuterol  3 mL Nebulization Q4H  . meropenem (MERREM) IV  500 mg Intravenous Q24H  . methylPREDNISolone (SOLU-MEDROL) injection  60 mg Intravenous Q12H  . NIFEdipine  90 mg Oral Daily  . vancomycin  750 mg Intravenous Q T,Th,Sa-HD   acetaminophen **OR** acetaminophen, albuterol, ondansetron **OR** ondansetron (ZOFRAN) IV  Assessment/ Plan:  50 y.o. female with a PMHX of end-stage renal disease, on dialysis since 2007. History [facility]. Transplant nephrectomy 2007. History of interstitial lung disease, maintained on prednisone 10 mg daily, pulmonary hypertension, left hip necrosis, was admitted on 02/12/2015 with hypotension and shortness of breath.   1. End-stage renal disease. [facility]. Tuesday/Thursday/Saturday. [facility]. 3 hours. Self cannulation.  2. Shortness of breath. Worsening of interstitial lung disease versus pulmonary edema  3. Pulmonary hypertension  4. Hypotension  5. Hyperkalemia   plan: Patient has underlying pulmonary hypertension and interstitial lung disease which makes her intravascular volume assessment rather difficult. Pulmonary crackles could be due to interstitial lung disease or Acute Pulm edema She's not allowing fluid removal with dialysis  Off of noninvasive positive pressure ventilation with iv steroids HD today - regular schedule      LOS: 2 Brittany Ramirez 1/12/20179:10 AM

## 2015-02-14 NOTE — Progress Notes (Signed)
[facility] [place] at [facility]   PATIENT NAME: Brittany Ramirez    MR#:  [id number]  DATE OF BIRTH:  March 13, 1965  SUBJECTIVE:  Patient seen this morning and was feeling better.  REVIEW OF SYSTEMS:    Review of Systems  Constitutional: Negative for fever, chills and malaise/fatigue.  HENT: Negative for sore throat.   Eyes: Negative for blurred vision.  Respiratory: Negative for cough, hemoptysis, shortness of breath and wheezing.   Cardiovascular: Negative for chest pain, palpitations and leg swelling.  Gastrointestinal: Negative for nausea, vomiting, abdominal pain, diarrhea and blood in stool.  Genitourinary: Negative for dysuria.  Musculoskeletal: Negative for back pain.  Neurological: Negative for dizziness, tremors and headaches.  Endo/Heme/Allergies: Does not bruise/bleed easily.    Tolerating Diet: Yes      DRUG ALLERGIES:   Allergies  Allergen Reactions  . Iron Anaphylaxis  . Peanuts [Peanut Oil] Other (See Comments)    Reaction:  Unknown   . Penicillins Shortness Of Breath and Other (See Comments)    Has patient had a PCN reaction causing immediate rash, facial/tongue/throat swelling, SOB or lightheadedness with hypotension: Yes Has patient had a PCN reaction causing severe rash involving mucus membranes or skin necrosis: No Has patient had a PCN reaction that required hospitalization No Has patient had a PCN reaction occurring within the last 10 years: No If all of the above answers are "NO", then may proceed with Cephalosporin use.  Marland Kitchen Potassium-Containing Compounds Shortness Of Breath and Other (See Comments)    Dialysis solution with 4K (per record).   Doylene Bode [Sulfamethoxazole-Trimethoprim] Shortness Of Breath  . Skelaxin [Metaxalone] Shortness Of Breath  . Erythromycin Rash  . Iodine Other (See Comments)    Reaction:  Unknown     VITALS:  Blood pressure 137/76, pulse 79, temperature 98.3 F (36.8 C), temperature source  Oral, resp. rate 19, height 5\' 4"  (1.626 m), weight 74.5 kg (164 lb 3.9 oz), last menstrual period 02/07/2015, SpO2 89 %.  PHYSICAL EXAMINATION:   Physical Exam  Constitutional: She is oriented to person, place, and time and well-developed, well-nourished, and in no distress. No distress.  HENT:  Head: Normocephalic.  Eyes: No scleral icterus.  Neck: Normal range of motion. Neck supple. No JVD present. No tracheal deviation present.  Cardiovascular: Normal rate, regular rhythm and normal heart sounds.  Exam reveals no gallop and no friction rub.   No murmur heard. Pulmonary/Chest: Effort normal. No respiratory distress. She has no wheezes. She has rales. She exhibits no tenderness.  Abdominal: Soft. Bowel sounds are normal. She exhibits no distension and no mass. There is no tenderness. There is no rebound and no guarding.  Musculoskeletal: Normal range of motion. She exhibits no edema.  Neurological: She is alert and oriented to person, place, and time.  Skin: Skin is warm. No rash noted. No erythema.  Psychiatric: Affect and judgment normal.      LABORATORY PANEL:   CBC  Recent Labs Lab 02/14/15 0629  WBC 3.0*  HGB 8.7*  HCT 27.5*  PLT 98*   ------------------------------------------------------------------------------------------------------------------  Chemistries   Recent Labs Lab 02/12/15 1427  02/14/15 0629  NA 139  < > 139  K 5.8*  < > 4.3  CL 101  < > 102  CO2 23  < > 31  GLUCOSE 85  < > 137*  BUN 71*  < > 19  CREATININE 14.14*  < > 5.22*  CALCIUM 6.5*  < > 6.8*  AST 23  --   --  ALT 17  --   --   ALKPHOS 55  --   --   BILITOT 0.8  --   --   < > = values in this interval not displayed. ------------------------------------------------------------------------------------------------------------------  Cardiac Enzymes  Recent Labs Lab 02/12/15 1427  TROPONINI <0.03    ------------------------------------------------------------------------------------------------------------------  RADIOLOGY:  Dg Chest 1 View  02/13/2015  CLINICAL DATA:  Sob. Patient was in dialysis treatment and exhibited respiratory distress. End stage renal disease. Restrictive lung disease. Copd. Cryptogenic organizing pneumonia. Former smoker. EXAM: CHEST 1 VIEW COMPARISON:  02/12/2015 FINDINGS: Heart is enlarged. There is persistent diffuse airspace filling bilaterally, consistent with pulmonary edema. Superimposed infectious process could have a similar appearance. Stable appearance of right brachiocephalic vein stent. Numerous remote rib fractures. IMPRESSION: Cardiomegaly and changes consistent with pulmonary edema. Electronically Signed   By: Norva PavlovElizabeth  Brown M.D.   On: 02/13/2015 15:59   Dg Chest 2 View  02/12/2015  CLINICAL DATA:  Shortness of breath EXAM: CHEST  2 VIEW COMPARISON:  10/21/2009 FINDINGS: Symmetric perihilar interstitial and airspace opacity compatible with edema. Small pleural effusions bilaterally. Chronic cardiopericardial enlargement and aortic tortuosity. Chronic hyperinflation. Remote bilateral rib fractures. Brachiocephalic vein stenting on the right. 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 IMPRESSION: CHF Electronically Signed   By: Marnee SpringJonathon  Watts M.D.   On: 02/12/2015 15:51     ASSESSMENT AND PLAN:   50 year old female with end-stage renal disease on hemodialysis, cryptogenic organizing pneumonia and essential hypertension who presented with shortness of breath and hypotension. For further details please refer the H&P.  1. Shortness of breath with hypoxia and acute respiratory failure: This is due to mild CHF from diastolic heart failure from end-stage renal disease. She  will receive dialysis while in the hospital. She will need oxygen at discharge primarily due to her underlying chronic lung disease.  2.  Hypotension: This was due to her medications that she was taking for her cold. Her blood pressures improved. She had an echocardiogram about a year ago that showed pulmonary hypertension. She can resume her outpatient nifedipine and Coreg.  3. End-stage renal disease and hematemesis: Patient will continue dialysis. Marland Kitchen.  4. Acute on chronic diastolic heart failure: Patient to undergo dialysis 5. History of cryptogenic organizing pneumonia with strict of lung disease: Patient is followed at [facility]. Continue maintenance of prednisone.       Management plans discussed with the patient and she is in agreement.  CODE STATUS: Full  TOTAL TIME TAKING CARE OF THIS PATIENT: 30 minutes.     POSSIBLE D/C today or tomorrow, DEPENDING ON CLINICAL CONDITION.   Kalan Rinn M.D on 02/14/2015 at 12:18 PM  Between 7am to 6pm - Pager - [phone] After 6pm go to [email or web address] - password [email or web address]  [facility]  Office  [phone]  CC: Primary care physician; Trey SailorsMOTTL,AMY, MD  Note: This dictation was prepared with Dragon dictation along with smaller phrase technology. Any transcriptional errors that result from this process are unintentional.

## 2015-02-14 NOTE — Progress Notes (Signed)
[facility] - [place] at [facility]   PATIENT NAME: Brittany Ramirez    MR#:  [id number]  DATE OF BIRTH:  24-Feb-1965  SUBJECTIVE:  Patient is feeling better this morning. She is off of BiPAP. She states the reason she went into respiratory failure was due to the fact that her prednisone was stopped in the emergency room.  REVIEW OF SYSTEMS:    Review of Systems  Constitutional: Negative for fever, chills and malaise/fatigue.  HENT: Negative for sore throat.   Eyes: Negative for blurred vision.  Respiratory: Positive for cough. Negative for hemoptysis, shortness of breath and wheezing.   Cardiovascular: Negative for chest pain, palpitations and leg swelling.  Gastrointestinal: Negative for nausea, vomiting, abdominal pain, diarrhea and blood in stool.  Genitourinary: Negative for dysuria.  Musculoskeletal: Negative for back pain.  Neurological: Negative for dizziness, tremors and headaches.  Endo/Heme/Allergies: Does not bruise/bleed easily.    Tolerating Diet: Yes      DRUG ALLERGIES:   Allergies  Allergen Reactions  . Iron Anaphylaxis  . Peanuts [Peanut Oil] Other (See Comments)    Reaction:  Unknown   . Penicillins Shortness Of Breath and Other (See Comments)    Has patient had a PCN reaction causing immediate rash, facial/tongue/throat swelling, SOB or lightheadedness with hypotension: Yes Has patient had a PCN reaction causing severe rash involving mucus membranes or skin necrosis: No Has patient had a PCN reaction that required hospitalization No Has patient had a PCN reaction occurring within the last 10 years: No If all of the above answers are "NO", then may proceed with Cephalosporin use.  Marland Kitchen Potassium-Containing Compounds Shortness Of Breath and Other (See Comments)    Dialysis solution with 4K (per record).   Doylene Bode [Sulfamethoxazole-Trimethoprim] Shortness Of Breath  . Skelaxin [Metaxalone] Shortness Of Breath  . Erythromycin Rash  .  Iodine Other (See Comments)    Reaction:  Unknown     VITALS:  Blood pressure 137/76, pulse 79, temperature 98.3 F (36.8 C), temperature source Oral, resp. rate 19, height  (1.626 m), weight 74.5 kg (164 lb 3.9 oz), last menstrual period 02/07/2015, SpO2 89 %.  PHYSICAL EXAMINATION:   Physical Exam  Constitutional: She is oriented to person, place, and time and well-developed, well-nourished, and in no distress. No distress.  HENT:  Head: Normocephalic.  Eyes: No scleral icterus.  Neck: Normal range of motion. Neck supple. No JVD present. No tracheal deviation present.  Cardiovascular: Normal rate, regular rhythm and normal heart sounds.  Exam reveals no gallop and no friction rub.   No murmur heard. Pulmonary/Chest: Effort normal. No respiratory distress. She has no wheezes. She has rales. She exhibits no tenderness.  Abdominal: Soft. Bowel sounds are normal. She exhibits no distension and no mass. There is no tenderness. There is no rebound and no guarding.  Musculoskeletal: Normal range of motion. She exhibits no edema.  Neurological: She is alert and oriented to person, place, and time.  Skin: Skin is warm. No rash noted. No erythema.  AV fistula  Psychiatric: Affect and judgment normal.      LABORATORY PANEL:   CBC  Recent Labs Lab 02/14/15 0629  WBC 3.0*  HGB 8.7*  HCT 27.5*  PLT 98*   ------------------------------------------------------------------------------------------------------------------  Chemistries   Recent Labs Lab 02/12/15 1427  02/14/15 0629  NA 139  < > 139  K 5.8*  < > 4.3  CL 101  < > 102  CO2 23  < > 31  GLUCOSE 85  < > 137*  BUN 71*  < > 19  CREATININE 14.14*  < > 5.22*  CALCIUM 6.5*  < > 6.8*  AST 23  --   --   ALT 17  --   --   ALKPHOS 55  --   --   BILITOT 0.8  --   --   < > = values in this interval not  displayed. ------------------------------------------------------------------------------------------------------------------  Cardiac Enzymes  Recent Labs Lab 02/12/15 1427  TROPONINI <0.03   ------------------------------------------------------------------------------------------------------------------  RADIOLOGY:  Dg Chest 1 View  02/13/2015  CLINICAL DATA:  Sob. Patient was in dialysis treatment and exhibited respiratory distress. End stage renal disease. Restrictive lung disease. Copd. Cryptogenic organizing pneumonia. Former smoker. EXAM: CHEST 1 VIEW COMPARISON:  02/12/2015 FINDINGS: Heart is enlarged. There is persistent diffuse airspace filling bilaterally, consistent with pulmonary edema. Superimposed infectious process could have a similar appearance. Stable appearance of right brachiocephalic vein stent. Numerous remote rib fractures. IMPRESSION: Cardiomegaly and changes consistent with pulmonary edema. Electronically Signed   By: Norva Pavlov M.D.   On: 02/13/2015 15:59   Dg Chest 2 View  02/12/2015  CLINICAL DATA:  Shortness of breath EXAM: CHEST  2 VIEW COMPARISON:  10/21/2009 FINDINGS: Symmetric perihilar interstitial and airspace opacity compatible with edema. Small pleural effusions bilaterally. Chronic cardiopericardial enlargement and aortic tortuosity. Chronic hyperinflation. Remote bilateral rib fractures. Brachiocephalic vein stenting on the right. 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 IMPRESSION: CHF Electronically Signed   By: Marnee Spring M.D.   On: 02/12/2015 15:51     ASSESSMENT AND PLAN:   50 year old female with end-stage renal disease on hemodialysis, cryptogenic organizing pneumonia and essential hypertension who presented with shortness of breath and hypotension. For further details please refer the H&P.  1. Acute hypoxic respiratory failure: This is due to mild CHF from diastolic heart failure from  end-stage renal disease as well as acute COPD exacerbation. She was planned for discharge after dialysis on 02/13/2015 however she had significant amount of respiratory distress while in dialysis. She had refused for a significant amount of fluid to be drawn from her during dialysis. She was started on IV steroids. She has subsequently improved. She is now off of BiPAP and place a nasal cannula. She will need home oxygen at discharge. She will undergo dialysis today. IV steroids have been changed to by mouth steroids. Continue inhalers.  Patient was on empiric antibiotic therapy due to her acute respiratory failure. She is currently neutropenic precautions due to low white blood cell count.  2. Hypotension: This was due to her medications that she was taking for her cold. Her blood pressures improved. She had an echocardiogram about a year ago that showed pulmonary hypertension. She can resume her outpatient  dosage of nifedipine and Coreg.  3. End-stage renal disease and hematemesis: Patient will continue dialysis as scheduled. Appreciate nephrology consultation.  4. Acute on chronic diastolic heart failure: Patient to undergo dialysisAgain today.  5. History of cryptogenic organizing pneumonia with strict of lung disease: Patient is followed at [facility] pulmonary. Continue maintenance of prednisone after completion of steroid taper.        Management plans discussed with the patient and she is in agreement.  CODE STATUS: Full  TOTAL TIME TAKING CARE OF THIS PATIENT: 25 minutes.     POSSIBLE D/C tomorrow, DEPENDING ON CLINICAL CONDITION.   Etosha Wetherell M.D on 02/14/2015 at 12:20 PM  Between 7am to 6pm - Pager - [phone]  After 6pm go to [email or web address] - password [email or web address]  [facility]  Office  [phone]  CC: Primary care physician; Trey Sailors, MD  Note: This dictation was prepared with Dragon dictation along with smaller phrase technology. Any transcriptional  errors that result from this process are unintentional.

## 2015-02-14 NOTE — Consult Note (Signed)
[facility] Pulmonary Medicine Consultation      Name: Brittany Ramirez MRN: [id number] DOB: 07/17/1965    ADMISSION DATE:  02/12/2015    CHIEF COMPLAINT:   Acute resp failure-resolved   HISTORY OF PRESENT ILLNESS  Patient more alert and awake this am, taken off biPAP resp status much improved Breathing back to baseline, no wheezing, no SOB, o2 sats intermittently drop but patient sitting up comfortably Patient      Review of Systems  Constitutional: Negative for fever, chills and malaise/fatigue.  Eyes: Negative for blurred vision.  Respiratory: Negative for cough, shortness of breath and wheezing.   Cardiovascular: Negative for chest pain.  Neurological: Negative for headaches.  All other systems reviewed and are negative.     VITAL SIGNS    Temp:  [97.7 F (36.5 C)-100.1 F (37.8 C)] 100.1 F (37.8 C) (01/12 0200) Pulse Rate:  [70-83] 77 (01/12 0810) Resp:  [14-27] 20 (01/12 0810) BP: (99-173)/(63-104) 160/93 mmHg (01/12 0810) SpO2:  [85 %-100 %] 93 % (01/12 0821) FiO2 (%):  [44 %-60 %] 44 % (01/12 0821) Weight:  [164 lb 3.9 oz (74.5 kg)-164 lb 10.9 oz (74.7 kg)] 164 lb 3.9 oz (74.5 kg) (01/11 1345) HEMODYNAMICS:   VENTILATOR SETTINGS: Vent Mode:  [-]  FiO2 (%):  [44 %-60 %] 44 % INTAKE / OUTPUT:  Intake/Output Summary (Last 24 hours) at 02/14/15 0840 Last data filed at 02/14/15 0328  Gross per 24 hour  Intake    240 ml  Output    500 ml  Net   -260 ml       PHYSICAL EXAM   Physical Exam  Constitutional: She is oriented to person, place, and time. No distress.  HENT:  Head: Normocephalic and atraumatic.  Eyes: EOM are normal. Pupils are equal, round, and reactive to light.  Neck: Normal range of motion. Neck supple.  Cardiovascular: Normal rate, regular rhythm and normal heart sounds.   No murmur heard. Pulmonary/Chest: No respiratory distress. She has no wheezes. She has no rales.  Abdominal: Soft. Bowel sounds are normal.    Musculoskeletal: Normal range of motion. She exhibits no edema.  Neurological: She is alert and oriented to person, place, and time.  Skin: Skin is warm. She is not diaphoretic.  Psychiatric: She has a normal mood and affect.       LABS   LABS:  CBC  Recent Labs Lab 02/12/15 1427 02/13/15 0638 02/14/15 0629  WBC 5.2 4.9 3.0*  HGB 8.9* 9.4* 8.7*  HCT 27.5* 28.8* 27.5*  PLT 118* 105* 98*   Coag's No results for input(s): APTT, INR in the last 168 hours. BMET  Recent Labs Lab 02/12/15 1427 02/13/15 0638 02/14/15 0629  NA 139 142 139  K 5.8* 4.1 4.3  CL 101 101 102  CO2 23 33* 31  BUN 71* 26* 19  CREATININE 14.14* 6.89* 5.22*  GLUCOSE 85 90 137*   Electrolytes  Recent Labs Lab 02/12/15 1427 02/13/15 0638 02/14/15 0629  CALCIUM 6.5* 7.4* 6.8*   Sepsis Markers No results for input(s): LATICACIDVEN, PROCALCITON, O2SATVEN in the last 168 hours. ABG  Recent Labs Lab 02/13/15 1540  PHART 7.27*  PCO2ART 77*  PO2ART 85   Liver Enzymes  Recent Labs Lab 02/12/15 1427  AST 23  ALT 17  ALKPHOS 55  BILITOT 0.8  ALBUMIN 3.3*   Cardiac Enzymes  Recent Labs Lab 02/12/15 1427  TROPONINI <0.03   Glucose  Recent Labs Lab 02/13/15 1523  GLUCAP 94  Recent Results (from the past 240 hour(s))  MRSA PCR Screening     Status: None   Collection Time: 02/13/15  3:22 PM  Result Value Ref Range Status   MRSA by PCR NEGATIVE NEGATIVE Final    Comment:        The GeneXpert MRSA Assay (FDA approved for NASAL specimens only), is one component of a comprehensive MRSA colonization surveillance program. It is not intended to diagnose MRSA infection nor to guide or monitor treatment for MRSA infections.      Current facility-administered medications:  .  acetaminophen (TYLENOL) tablet 650 mg, 650 mg, Oral, Q6H PRN, 650 mg at 02/12/15 1829 **OR** acetaminophen (TYLENOL) suppository 650 mg, 650 mg, Rectal, Q6H PRN, Houston SirenVivek J Sainani, MD .   albuterol (PROVENTIL) (2.5 MG/3ML) 0.083% nebulizer solution 2.5 mg, 2.5 mg, Nebulization, Q6H PRN, [place] SirenVivek J Sainani, MD, 2.5 mg at 02/13/15 1252 .  antiseptic oral rinse (CPC / CETYLPYRIDINIUM CHLORIDE 0.05%) solution 7 mL, 7 mL, Mouth Rinse, q12n4p, [name] Mody, MD .  budesonide (PULMICORT) nebulizer solution 0.25 mg, 0.25 mg, Nebulization, BID, Erin FullingKurian Yarianna Varble, MD, 0.25 mg at 02/14/15 0817 .  calcium carbonate (TUMS - dosed in mg elemental calcium) chewable tablet 200 mg of elemental calcium, 1 tablet, Oral, TID, Houston SirenVivek J Sainani, MD, 200 mg of elemental calcium at 02/13/15 2300 .  carvedilol (COREG) tablet 25 mg, 25 mg, Oral, BID WC, Crissie FiguresEdavally N Reddy, MD, 25 mg at 02/13/15 0708 .  chlorhexidine (PERIDEX) 0.12 % solution 15 mL, 15 mL, Mouth Rinse, BID, [name] Mody, MD, 15 mL at 02/13/15 2301 .  cinacalcet (SENSIPAR) tablet 90 mg, 90 mg, Oral, QPC supper, [place] SirenVivek J Sainani, MD, 90 mg at 02/13/15 0310 .  heparin injection 5,000 Units, 5,000 Units, Subcutaneous, Q12H, Adrian SaranSital Mody, MD, 5,000 Units at 02/13/15 2300 .  ipratropium-albuterol (DUONEB) 0.5-2.5 (3) MG/3ML nebulizer solution 3 mL, 3 mL, Nebulization, Q4H, [name] Mody, MD, 3 mL at 02/14/15 0817 .  meropenem (MERREM) 500 mg in sodium chloride 0.9 % 50 mL IVPB, 500 mg, Intravenous, Q24H, Erin FullingKurian Tyneisha Hegeman, MD .  methylPREDNISolone sodium succinate (SOLU-MEDROL) 125 mg/2 mL injection 60 mg, 60 mg, Intravenous, Q12H, [name] Mody, MD, 60 mg at 02/14/15 0150 .  NIFEdipine (PROCARDIA XL/ADALAT-CC) 24 hr tablet 90 mg, 90 mg, Oral, Daily, Crissie FiguresEdavally N Reddy, MD, 90 mg at 02/13/15 1000 .  ondansetron (ZOFRAN) tablet 4 mg, 4 mg, Oral, Q6H PRN **OR** ondansetron (ZOFRAN) injection 4 mg, 4 mg, Intravenous, Q6H PRN, Houston SirenVivek J Sainani, MD, 4 mg at 02/13/15 0537 .  vancomycin (VANCOCIN) IVPB 750 mg/150 ml premix, 750 mg, Intravenous, Q T,Th,Sa-HD, Bertram SavinMichael L Simpson, RPH  IMAGING   CXR 1/10 images reveiwed 02/14/2015     MICRO DATA: MRSA PCR  Urine  Blood Resp    ANTIMICROBIALS:  Meropenem/Vanc-1/11    ASSESSMENT/PLAN  50 yo AAF with acute resp failure from acute COPD exacerbation with acute pulm edema with sepsis with previous h/o severe obstructive lung disease and ILD with COP  PULMONARY 1.Respiratory Failure-resolved Will continue steroids-switch to oral prednisone 40 mg daily -start spiriva and dulera   CARDIOVASCULAR BP stable and sepsis resolved  RENAL ESRD on HD -follow up nephology recs  GASTROINTESTINAL -advance diet  HEMATOLOGIC Follow CBC  INFECTIOUS Empiric abx therapy for  -neutropenic precautions  OK to transfer to gen med floor    The Patient requires high complexity decision making for assessment and support, frequent evaluation and titration of therapies, application of advanced monitoring technologies and extensive interpretation  of multiple databases.   Lucie Leather, M.D.  [facility]  Medical Director [facility] Medical Director [facility] Cardio-Pulmonary Department

## 2015-02-14 NOTE — Progress Notes (Signed)
Brittany Ramirez setup patients breakfast this am. Called into room. Patient states that she does not want a renal diet and does not eat that kind of food. Explained the importance of a renal diet but patient stopped me in the middle and stated that she does not live in a box. I advised her that she can speak with the doctor regarding her concerns with her diet.

## 2015-02-14 NOTE — Progress Notes (Signed)
Initial Nutrition Assessment     INTERVENTION:   Meals and Snacks: Cater to patient preferences Education: diet education not appropriate at this time as pt refusing renal diet, only wants "regular" food/diet   NUTRITION DIAGNOSIS:   Limited adherence to nutrition-related recommendations related to chronic illness as evidenced by  (pt not wanting renal diet, wanting regular diet).  GOAL:   Patient will meet greater than or equal to 90% of their needs   MONITOR:    (Energy Intake, Anthropometrics, Electrolyte/Renal Profile, Digestive System)  REASON FOR ASSESSMENT:   Diagnosis    ASSESSMENT:    Pt admitted with acute respiratory failure due to mild CHF and acute COPD exacerbation; with with ESRD on HD  Past Medical History  Diagnosis Date  . End stage renal disease (HCC)   . Restrictive lung disease   . COPD (chronic obstructive pulmonary disease) (HCC)   . Essential hypertension   . Cryptogenic organizing pneumonia [facility](HCC)      Diet Order:  Diet 2 gram sodium Room service appropriate?: Yes; Fluid consistency:: Thin; pt on Renal Diet this AM but pt refusing renal diet (wants Regular Diet), MD changed diet to low sodium  Energy Intake: no recorded po intake, pt refused to eat renal breakfast tray  Skin:  Reviewed, no issues  Last BM:  1/9   Electrolyte and Renal Profile:  Recent Labs Lab 02/12/15 1427 02/13/15 0638 02/14/15 0629  BUN 71* 26* 19  CREATININE 14.14* 6.89* 5.22*  NA 139 142 139  K 5.8* 4.1 4.3   Glucose Profile:   Recent Labs  02/13/15 1523  GLUCAP 94   Meds: sensipar, prednisone  Height:   Ht Readings from Last 1 Encounters:  02/12/15 5\' 4"  (1.626 m)    Weight:   Wt Readings from Last 1 Encounters:  02/13/15 164 lb 3.9 oz (74.5 kg)    Filed Weights   02/13/15 0030 02/13/15 0945 02/13/15 1345  Weight: 160 lb (72.576 kg) 164 lb 10.9 oz (74.7 kg) 164 lb 3.9 oz (74.5 kg)    BMI:  Body mass index is 28.18  kg/(m^2).  EDUCATION NEEDS:   Education needs no appropriate at this time  LOW Care Level  Romelle StarcherCate Javeion Cannedy MS, RD, LDN [phone] Pager  [phone] Weekend/On-Call Pager

## 2015-02-14 NOTE — Progress Notes (Signed)
RT called to room to remove patient from Bipap.  Patient very angry this am regarding being on Bipap, RT placed patient on 6LPM , O2 sat 93%, tolerating well and will continue to monitor.

## 2015-02-14 NOTE — Progress Notes (Signed)
Patients BP elevated 160's to 170's. Patient would not take her coreg until Dr Belia HemanKasa orders her prednisone. Dr Belia HemanKasa at bedside and will place orders. Patient has refused to take coreg at this time. Explained to patient the importance and she refused.

## 2015-02-14 NOTE — Progress Notes (Signed)
Patient states that she want to leave hospital. Explained to patient that she is on bipap at 60% and per respitory therapist she cannot titrate her down because she decompensates into the low 80's. Patient states that she wants to leave called Dr Belia HemanKasa and he states to titrate her down to high flow. Patient want to eat, and wants her prednisone. States that we are not listening and she [facility].

## 2015-02-14 NOTE — Care Management Note (Signed)
Patient is active at [facility] [place] on TTS schedule. Admission records have been sent and additional records will be sent at discharge.  Brittany ReiningKim Christinna Sprung   Dialysis Liaison [phone]

## 2015-02-15 LAB — RENAL FUNCTION PANEL
Albumin: 3.1 g/dL — ABNORMAL LOW (ref 3.5–5.0)
Anion gap: 5 (ref 5–15)
BUN: 38 mg/dL — AB (ref 6–20)
CALCIUM: 7.3 mg/dL — AB (ref 8.9–10.3)
CO2: 34 mmol/L — AB (ref 22–32)
CREATININE: 7.51 mg/dL — AB (ref 0.44–1.00)
Chloride: 99 mmol/L — ABNORMAL LOW (ref 101–111)
GFR calc non Af Amer: 6 mL/min — ABNORMAL LOW (ref >60)
GFR, EST AFRICAN AMERICAN: 7 mL/min — AB (ref >60)
GLUCOSE: 115 mg/dL — AB (ref 65–99)
Phosphorus: 4.1 mg/dL (ref 2.5–4.6)
Potassium: 4.3 mmol/L (ref 3.5–5.1)
SODIUM: 138 mmol/L (ref 135–145)

## 2015-02-15 LAB — CBC
HCT: 26.8 % — ABNORMAL LOW (ref 35.0–47.0)
Hemoglobin: 8.5 g/dL — ABNORMAL LOW (ref 12.0–16.0)
MCH: 28.3 pg (ref 26.0–34.0)
MCHC: 31.9 g/dL — ABNORMAL LOW (ref 32.0–36.0)
MCV: 88.7 fL (ref 80.0–100.0)
PLATELETS: 107 10*3/uL — AB (ref 150–440)
RBC: 3.02 MIL/uL — ABNORMAL LOW (ref 3.80–5.20)
RDW: 15.2 % — ABNORMAL HIGH (ref 11.5–14.5)
WBC: 5.9 10*3/uL (ref 3.6–11.0)

## 2015-02-15 MED ORDER — TIOTROPIUM BROMIDE MONOHYDRATE 18 MCG IN CAPS: 18.0000 ug | ORAL_CAPSULE | Freq: Every day | RESPIRATORY_TRACT | Status: DC

## 2015-02-15 MED ORDER — PREDNISONE 10 MG PO TABS: 10.0000 mg | ORAL_TABLET | Freq: Every day | ORAL | Status: DC

## 2015-02-15 NOTE — Progress Notes (Signed)
Subjective:  Patient seen during dialysis Tolerating well  2000 cc removed Signed off early   HEMODIALYSIS FLOWSHEET:  Blood Flow Rate (mL/min): 400 mL/min Arterial Pressure (mmHg): -130 mmHg Venous Pressure (mmHg): 250 mmHg Transmembrane Pressure (mmHg): 70 mmHg Ultrafiltration Rate (mL/min): 1080 mL/min Dialysate Flow Rate (mL/min): 700 ml/min Conductivity: Machine : 13.9 Conductivity: Machine : 13.9 Dialysis Fluid Bolus: Normal Saline Bolus Amount (mL): 250 mL Intra-Hemodialysis Comments: (PT REQUESTS CHANGES TO BFR AND UF GOAL)    Objective:  Vital signs in last 24 hours:  Temp:  [98.2 F (36.8 C)-98.9 F (37.2 C)] 98.2 F (36.8 C) (01/13 0940) Pulse Rate:  [66-79] 73 (01/13 1130) Resp:  [17-24] 17 (01/12 2130) BP: (121-142)/(73-90) 121/85 mmHg (01/13 1130) SpO2:  [89 %-95 %] 91 % (01/13 0447) Weight:  [75.7 kg (166 lb 14.2 oz)] 75.7 kg (166 lb 14.2 oz) (01/13 0940)  Weight change:  Filed Weights   02/13/15 0945 02/13/15 1345 02/15/15 0940  Weight: 74.7 kg (164 lb 10.9 oz) 74.5 kg (164 lb 3.9 oz) 75.7 kg (166 lb 14.2 oz)    Intake/Output:    Intake/Output Summary (Last 24 hours) at 02/15/15 1151 Last data filed at 02/15/15 0715  Gross per 24 hour  Intake      0 ml  Output      0 ml  Net      0 ml     Physical Exam: General: Chronically ill-appearing  HEENT [place] o2  Neck supple  Pulm/lungs Diffuse bilateral crackles  CVS/Heart regular, systolic murmur present  Abdomen:  Soft, nontender  Extremities: + peripheral edema  Neurologic: alert, follows commands  Skin: No acute rashes  Access: Aneurysmal rt forearm fistula       Basic Metabolic Panel:   Recent Labs Lab 02/12/15 1427 02/13/15 0638 02/14/15 0629 02/15/15 0943  NA 139 142 139 138  K 5.8* 4.1 4.3 4.3  CL 101 101 102 99*  CO2 23 33* 31 34*  GLUCOSE 85 90 137* 115*  BUN 71* 26* 19 38*  CREATININE 14.14* 6.89* 5.22* 7.51*  CALCIUM 6.5* 7.4* 6.8* 7.3*  PHOS  --   --   --   4.1     CBC:  Recent Labs Lab 02/12/15 1427 02/13/15 0638 02/14/15 0629 02/15/15 0943  WBC 5.2 4.9 3.0* 5.9  HGB 8.9* 9.4* 8.7* 8.5*  HCT 27.5* 28.8* 27.5* 26.8*  MCV 88.9 88.3 89.2 88.7  PLT 118* 105* 98* 107*      Microbiology:  Recent Results (from the past 720 hour(s))  MRSA PCR Screening     Status: None   Collection Time: 02/13/15  3:22 PM  Result Value Ref Range Status   MRSA by PCR NEGATIVE NEGATIVE Final    Comment:        The GeneXpert MRSA Assay (FDA approved for NASAL specimens only), is one component of a comprehensive MRSA colonization surveillance program. It is not intended to diagnose MRSA infection nor to guide or monitor treatment for MRSA infections.     Coagulation Studies: No results for input(s): LABPROT, INR in the last 72 hours.  Urinalysis: No results for input(s): COLORURINE, LABSPEC, PHURINE, GLUCOSEU, HGBUR, BILIRUBINUR, KETONESUR, PROTEINUR, UROBILINOGEN, NITRITE, LEUKOCYTESUR in the last 72 hours.  Invalid input(s): APPERANCEUR    Imaging: Dg Chest 1 View  02/13/2015  CLINICAL DATA:  Sob. Patient was in dialysis treatment and exhibited respiratory distress. End stage renal disease. Restrictive lung disease. Copd. Cryptogenic organizing pneumonia. Former smoker. EXAM: CHEST 1 VIEW  COMPARISON:  02/12/2015 FINDINGS: Heart is enlarged. There is persistent diffuse airspace filling bilaterally, consistent with pulmonary edema. Superimposed infectious process could have a similar appearance. Stable appearance of right brachiocephalic vein stent. Numerous remote rib fractures. IMPRESSION: Cardiomegaly and changes consistent with pulmonary edema. Electronically Signed   By: Norva PavlovElizabeth  Brown M.D.   On: 02/13/2015 15:59     Medications:     . albuterol  2.5 mg Nebulization Q4H  . antiseptic oral rinse  7 mL Mouth Rinse q12n4p  . budesonide (PULMICORT) nebulizer solution  0.25 mg Nebulization BID  . calcium carbonate  2 tablet Oral TID   . carvedilol  25 mg Oral BID WC  . chlorhexidine  15 mL Mouth Rinse BID  . cinacalcet  90 mg Oral QPC supper  . mometasone-formoterol  2 puff Inhalation BID  . NIFEdipine  90 mg Oral Daily  . predniSONE  40 mg Oral Q breakfast  . tiotropium  18 mcg Inhalation Daily   acetaminophen **OR** acetaminophen, albuterol, ondansetron **OR** ondansetron (ZOFRAN) IV  Assessment/ Plan:  50 y.o. female with a PMHX of end-stage renal disease, on dialysis since 2007. [facility]. Transplant nephrectomy 2007. History of interstitial lung disease, maintained on prednisone 10 mg daily, pulmonary hypertension, left hip necrosis, was admitted on 02/12/2015 with hypotension and shortness of breath.   1. End-stage renal disease. [facility]. Tuesday/Thursday/Saturday. [facility]. 3 hours. Self cannulation.  2. Shortness of breath. acute pulmonary edema vs ILD 3. Pulmonary hypertension  4. Hypotension  5. Hyperkalemia   plan: Patient has underlying pulmonary hypertension and interstitial lung disease which makes her intravascular volume assessment rather difficult. Pulmonary crackles could be due to interstitial lung disease or Acute Pulm edema 2000 cc removed with Off of noninvasive positive pressure ventilation with iv steroids       LOS: 3 Brittany Ramirez 1/13/201711:51 AM

## 2015-02-15 NOTE — Progress Notes (Signed)
Dr. Sheryle Hailiamond will talk to nephrologist on call- Dr. Thedore MinsSingh about getting dialysis today before discharge.  She is scheduled for 9am today.  [name], [name] N  02/15/2015  5:44 AM

## 2015-02-15 NOTE — Care Management (Signed)
Attempted case management consult. Patient declines at this time.  Patient has required acute O2 this entire admission, as well as continuous BIPAP in the CCU.  Patient refuses to be assessed for qualifying O2 sats.  Patient states "I didn't come in with it, and I won't be going out with it"  Patient states she will up outpatient with her pulmonologist.  Department director notified of my concerns.  Bedside RN to contact Dr. Juliene PinaMody to let her know.

## 2015-02-15 NOTE — Discharge Summary (Addendum)
[facility] - [place] at [facility]   PATIENT NAME: Brittany Ramirez    MR#:  [id number]  DATE OF BIRTH:  Jan 14, 1966  DATE OF ADMISSION:  02/12/2015 ADMITTING PHYSICIAN: Houston Siren, MD  DATE OF DISCHARGE: 02/15/2015  PRIMARY CARE PHYSICIAN: MOTTL,AMY, MD    ADMISSION DIAGNOSIS:  Acute pulmonary edema (HCC) [J81.0] Hypoxia [R09.02]  DISCHARGE DIAGNOSIS:  Active Problems:   CHF (congestive heart failure) (HCC)   COPD exacerbation (HCC)   Acute respiratory failure (HCC)   Pneumonia   SECONDARY DIAGNOSIS:   Past Medical History  Diagnosis Date  . End stage renal disease (HCC)   . Restrictive lung disease   . COPD (chronic obstructive pulmonary disease) (HCC)   . Essential hypertension   . Cryptogenic organizing pneumonia [facility])     HOSPITAL COURSE:   50 year old female with end-stage renal disease on hemodialysis, cryptogenic organizing pneumonia and essential hypertension who presented with shortness of breath and hypotension. For further details please refer the H&P.  1. Acute hypoxic respiratory failure: This is due to mild CHF from diastolic heart failure from end-stage renal disease as well as acute COPD exacerbation. She was planned for discharge after dialysis on 02/13/2015 however she had significant amount of respiratory distress while in dialysis. She had refused for a significant amount of fluid to be drawn from her during dialysis. She was started on IV steroids. She has subsequently improved. She is now off of BiPAP and place a nasal cannula. She will need home oxygen at discharge. She will undergo dialysis today. IV steroids have been changed to by mouth steroids. Continue inhalers.  Patient was on empiric antibiotic therapy due to her acute respiratory failure. She is currently neutropenic precautions due to low white blood cell count.  2. Hypotension: This was due to her medications that she was taking for her cold. Her blood pressures  improved. She had an echocardiogram about a year ago that showed pulmonary hypertension. She can resume her outpatient dosage of nifedipine and Coreg.  3. End-stage renal disease and hematemesis: Patient will continue dialysis as scheduled. Appreciate nephrology consultation.  4. Acute on chronic diastolic heart failure: Patient underwent hemodialysis while in the hospital. .  5. History of cryptogenic organizing pneumonia with strict of lung disease: Patient is followed at [facility] pulmonary. Continue maintenance of prednisone after completion of steroid taper.    DISCHARGE CONDITIONS AND DIET:   Patient is stable for discharge on a renal diet  CONSULTS OBTAINED:  Treatment Team:  Mosetta Pigeon, MD Erin Fulling, MD  DRUG ALLERGIES:   Allergies  Allergen Reactions  . Iron Anaphylaxis  . Peanuts [Peanut Oil] Other (See Comments)    Reaction:  Unknown   . Penicillins Shortness Of Breath and Other (See Comments)    Has patient had a PCN reaction causing immediate rash, facial/tongue/throat swelling, SOB or lightheadedness with hypotension: Yes Has patient had a PCN reaction causing severe rash involving mucus membranes or skin necrosis: No Has patient had a PCN reaction that required hospitalization No Has patient had a PCN reaction occurring within the last 10 years: No If all of the above answers are "NO", then may proceed with Cephalosporin use.  Marland Kitchen Potassium-Containing Compounds Shortness Of Breath and Other (See Comments)    Dialysis solution with 4K (per record).   Doylene Bode [Sulfamethoxazole-Trimethoprim] Shortness Of Breath  . Skelaxin [Metaxalone] Shortness Of Breath  . Erythromycin Rash  . Iodine Other (See Comments)    Reaction:  Unknown  DISCHARGE MEDICATIONS:   Current Discharge Medication List    START taking these medications   Details  !! predniSONE (DELTASONE) 10 MG tablet Take 1 tablet (10 mg total) by mouth daily with breakfast. 40 mg PO (by mouth) x 1  days 30 mg PO x 2 days 20 mg PO x 2 days the resume  10 mg PO daily Qty: 20 tablet, Refills: 0    tiotropium (SPIRIVA) 18 MCG inhalation capsule Place 1 capsule (18 mcg total) into inhaler and inhale daily. Qty: 30 capsule, Refills: 12     !! - Potential duplicate medications found. Please discuss with provider.    CONTINUE these medications which have NOT CHANGED   Details  albuterol (PROVENTIL HFA;VENTOLIN HFA) 108 (90 Base) MCG/ACT inhaler Inhale 2 puffs into the lungs every 6 (six) hours as needed for wheezing or shortness of breath.    calcium carbonate (TUMS - DOSED IN MG ELEMENTAL CALCIUM) 500 MG chewable tablet Chew 1 tablet by mouth 3 (three) times daily.    carvedilol (COREG) 25 MG tablet Take 25 mg by mouth 2 (two) times daily.    cinacalcet (SENSIPAR) 30 MG tablet Take 90 mg by mouth daily.    NIFEdipine (PROCARDIA XL/ADALAT-CC) 90 MG 24 hr tablet Take 90 mg by mouth daily.    !! predniSONE (DELTASONE) 10 MG tablet Take 10 mg by mouth daily.     !! - Potential duplicate medications found. Please discuss with provider.            Today   CHIEF COMPLAINT:  Patient is doing well this point. Patient wants to leave after dialysis.   VITAL SIGNS:  Blood pressure 142/90, pulse 79, temperature 98.3 F (36.8 C), temperature source Oral, resp. rate 17, height 5\' 4"  (1.626 m), weight 74.5 kg (164 lb 3.9 oz), last menstrual period 02/07/2015, SpO2 91 %.   REVIEW OF SYSTEMS:  Review of Systems  Constitutional: Negative for fever, chills and malaise/fatigue.  HENT: Negative for sore throat.   Eyes: Negative for blurred vision.  Respiratory: Negative for cough, hemoptysis, shortness of breath and wheezing.   Cardiovascular: Negative for chest pain, palpitations and leg swelling.  Gastrointestinal: Negative for nausea, vomiting, abdominal pain, diarrhea and blood in stool.  Genitourinary: Negative for dysuria.  Musculoskeletal: Negative for back pain.   Neurological: Negative for dizziness, tremors and headaches.  Endo/Heme/Allergies: Does not bruise/bleed easily.     PHYSICAL EXAMINATION:  GENERAL:  50 y.o.-year-old patient lying in the bed with no acute distress.  NECK:  Supple, no jugular venous distention. No thyroid enlargement, no tenderness.  LUNGS: Normal breath sounds bilaterally, no wheezing, she has bilateral inspiratory crackles no rales,rhonchi  No use of accessory muscles of respiration.  CARDIOVASCULAR: S1, S2 normal. No murmurs, rubs, or gallops.  ABDOMEN: Soft, non-tender, non-distended. Bowel sounds present. No organomegaly or mass.  EXTREMITIES: No pedal edema, cyanosis, or clubbing.  PSYCHIATRIC: The patient is alert and oriented x 3.  SKIN: No obvious rash, lesion, or ulcer.   DATA REVIEW:   CBC  Recent Labs Lab 02/14/15 0629  WBC 3.0*  HGB 8.7*  HCT 27.5*  PLT 98*    Chemistries   Recent Labs Lab 02/12/15 1427  02/14/15 0629  NA 139  < > 139  K 5.8*  < > 4.3  CL 101  < > 102  CO2 23  < > 31  GLUCOSE 85  < > 137*  BUN 71*  < > 19  CREATININE 14.14*  < >  5.22*  CALCIUM 6.5*  < > 6.8*  AST 23  --   --   ALT 17  --   --   ALKPHOS 55  --   --   BILITOT 0.8  --   --   < > = values in this interval not displayed.  Cardiac Enzymes  Recent Labs Lab 02/12/15 1427  TROPONINI <0.03    Microbiology Results  @MICRORSLT48 @  RADIOLOGY:  Dg Chest 1 View  02/13/2015  CLINICAL DATA:  Sob. Patient was in dialysis treatment and exhibited respiratory distress. End stage renal disease. Restrictive lung disease. Copd. Cryptogenic organizing pneumonia. Former smoker. EXAM: CHEST 1 VIEW COMPARISON:  02/12/2015 FINDINGS: Heart is enlarged. There is persistent diffuse airspace filling bilaterally, consistent with pulmonary edema. Superimposed infectious process could have a similar appearance. Stable appearance of right brachiocephalic vein stent. Numerous remote rib fractures. IMPRESSION: Cardiomegaly and  changes consistent with pulmonary edema. Electronically Signed   By: Norva Pavlov M.D.   On: 02/13/2015 15:59      Management plans discussed with the patient and she is in agreement. Stable for discharge   Patient should follow up with PCP in one week  CODE STATUS:     Code Status Orders        Start     Ordered   02/12/15 1804  Full code   Continuous     02/12/15 1803    Code Status History    Date Active Date Inactive Code Status Order ID Comments User Context   This patient has a current code status but no historical code status.    Advance Directive Documentation        Most Recent Value   Type of Advance Directive  Healthcare Power of Attorney   Pre-existing out of facility DNR order (yellow form or pink MOST form)     "MOST" Form in Place?        TOTAL TIME TAKING CARE OF THIS PATIENT: 35 minutes.    Note: This dictation was prepared with Dragon dictation along with smaller phrase technology. Any transcriptional errors that result from this process are unintentional.  Elysha Daw M.D on 02/15/2015 at 9:44 AM  Between 7am to 6pm - Pager - [phone] After 6pm go to [email or web address] - password [email or web address] [facility]  [facility]  Office  [phone]  CC: Primary care physician; Trey Sailors, MD  ADDENDUM:  Patient came back from HD and was on O2 3 liters. When nurse came to give her d/c paperwork she refused O2. Earlier in am we had discussed the need for HOME O2. I tried to speak to her about Oxygen but she did not want to speak with me or case management. I made sure that nursing made her aware of the potential dangers of not having O2 and that she would likely come right back to the hospital due to SOB and hypoxia and respiratory failure. She has the capacity to make her decisions and has decided not to use OXYGEN as recommended strongly by myself.

## 2015-02-15 NOTE — Progress Notes (Addendum)
Upon pt arrival back from Dialysis vitals signs were taken with patient on 3L O2. Pt refused all medications upon arrival back to floor that had been missed during dialysis. VSS at this time. Case management attempted to discuss setting up home O2 for patient. Patient refused to listen to case manager. Pt also refused to allow nurse to check her oxygen level on room air. Dr. Juliene PinaMody notified that patient would not allow staff to check her oxygen level nor would she allow staff to set-up home O2. Pt had discharge order and discharge papers had been printed. Pt took Discharge packet as well as prescription, but would not sign discharge paperwork. Patient also refused to sign AMA papers. IV was removed prior to discharge as well. Pt was explained numerous time the implications of leaving without oxygen and that she could end up right back in the hospital. Pt still refused anything that the staff would ask of her. Pt was then pushed down in wheelchair by nurse. Dr. Juliene PinaMody informed that pt did not sign papers nor did she allow home oxygen to be set up.

## 2015-02-15 NOTE — Progress Notes (Signed)
Called Dr. Sheryle Hailiamond and asked him to talk to patient.  Patient is demanding dialysis because she believes that she is in fluid overload.  Her lower lobes have crackles but upper lobes are clear on 3L O2. [name], Cleona Doubleday N  02/15/2015 5:32 AM

## 2015-02-15 NOTE — Care Management Important Message (Signed)
Important Message  Patient Details  Name: Brittany Ramirez MRN: [id number] Date of Birth: 1965/12/08   Medicare Important Message Given:  Yes    Chapman Fitch, RN 02/15/2015, 10:25 AM

## 2015-02-15 NOTE — Care Management (Signed)
At time of discharge patient was on 3 liters of O2 acutely.  Dr. Juliene PinaMody was notified that patient declined to be assessed for home O2.  Dr. Juliene PinaMody informed nursing staff to discharge as planned

## 2015-02-16 DIAGNOSIS — F17210 Nicotine dependence, cigarettes, uncomplicated: Secondary | ICD-10-CM | POA: Diagnosis not present

## 2015-02-16 DIAGNOSIS — J9601 Acute respiratory failure with hypoxia: Secondary | ICD-10-CM | POA: Diagnosis not present

## 2015-02-16 DIAGNOSIS — R34 Anuria and oliguria: Secondary | ICD-10-CM | POA: Diagnosis present

## 2015-02-16 DIAGNOSIS — J984 Other disorders of lung: Secondary | ICD-10-CM | POA: Diagnosis not present

## 2015-02-16 DIAGNOSIS — F17200 Nicotine dependence, unspecified, uncomplicated: Secondary | ICD-10-CM | POA: Diagnosis present

## 2015-02-16 DIAGNOSIS — J810 Acute pulmonary edema: Secondary | ICD-10-CM | POA: Diagnosis not present

## 2015-02-16 DIAGNOSIS — I132 Hypertensive heart and chronic kidney disease with heart failure and with stage 5 chronic kidney disease, or end stage renal disease: Secondary | ICD-10-CM | POA: Diagnosis not present

## 2015-02-16 DIAGNOSIS — I272 Other secondary pulmonary hypertension: Secondary | ICD-10-CM | POA: Diagnosis present

## 2015-02-16 DIAGNOSIS — Z7952 Long term (current) use of systemic steroids: Secondary | ICD-10-CM | POA: Diagnosis not present

## 2015-02-16 DIAGNOSIS — Z992 Dependence on renal dialysis: Secondary | ICD-10-CM | POA: Diagnosis not present

## 2015-02-16 DIAGNOSIS — D631 Anemia in chronic kidney disease: Secondary | ICD-10-CM | POA: Diagnosis present

## 2015-02-16 DIAGNOSIS — I120 Hypertensive chronic kidney disease with stage 5 chronic kidney disease or end stage renal disease: Secondary | ICD-10-CM | POA: Diagnosis present

## 2015-02-16 DIAGNOSIS — I959 Hypotension, unspecified: Secondary | ICD-10-CM | POA: Diagnosis not present

## 2015-02-16 DIAGNOSIS — J8489 Other specified interstitial pulmonary diseases: Secondary | ICD-10-CM | POA: Diagnosis present

## 2015-02-16 DIAGNOSIS — I161 Hypertensive emergency: Secondary | ICD-10-CM | POA: Diagnosis present

## 2015-02-16 DIAGNOSIS — R0602 Shortness of breath: Secondary | ICD-10-CM | POA: Diagnosis not present

## 2015-02-16 DIAGNOSIS — J811 Chronic pulmonary edema: Secondary | ICD-10-CM | POA: Diagnosis not present

## 2015-02-16 DIAGNOSIS — I5032 Chronic diastolic (congestive) heart failure: Secondary | ICD-10-CM | POA: Diagnosis present

## 2015-02-16 DIAGNOSIS — Z7409 Other reduced mobility: Secondary | ICD-10-CM | POA: Diagnosis not present

## 2015-02-16 DIAGNOSIS — J449 Chronic obstructive pulmonary disease, unspecified: Secondary | ICD-10-CM | POA: Diagnosis present

## 2015-02-16 DIAGNOSIS — R0902 Hypoxemia: Secondary | ICD-10-CM | POA: Diagnosis not present

## 2015-02-16 DIAGNOSIS — N186 End stage renal disease: Secondary | ICD-10-CM | POA: Diagnosis not present

## 2015-02-16 DIAGNOSIS — T8612 Kidney transplant failure: Secondary | ICD-10-CM | POA: Diagnosis not present

## 2015-02-20 DIAGNOSIS — Z992 Dependence on renal dialysis: Secondary | ICD-10-CM | POA: Diagnosis not present

## 2015-02-20 DIAGNOSIS — N186 End stage renal disease: Secondary | ICD-10-CM | POA: Diagnosis not present

## 2015-02-20 DIAGNOSIS — I120 Hypertensive chronic kidney disease with stage 5 chronic kidney disease or end stage renal disease: Secondary | ICD-10-CM | POA: Diagnosis not present

## 2015-02-21 DIAGNOSIS — D509 Iron deficiency anemia, unspecified: Secondary | ICD-10-CM | POA: Diagnosis not present

## 2015-02-21 DIAGNOSIS — D631 Anemia in chronic kidney disease: Secondary | ICD-10-CM | POA: Diagnosis not present

## 2015-02-21 DIAGNOSIS — N186 End stage renal disease: Secondary | ICD-10-CM | POA: Diagnosis not present

## 2015-02-21 DIAGNOSIS — L299 Pruritus, unspecified: Secondary | ICD-10-CM | POA: Diagnosis not present

## 2015-02-21 DIAGNOSIS — Z992 Dependence on renal dialysis: Secondary | ICD-10-CM | POA: Diagnosis not present

## 2015-02-21 DIAGNOSIS — I120 Hypertensive chronic kidney disease with stage 5 chronic kidney disease or end stage renal disease: Secondary | ICD-10-CM | POA: Diagnosis not present

## 2015-02-21 DIAGNOSIS — N2581 Secondary hyperparathyroidism of renal origin: Secondary | ICD-10-CM | POA: Diagnosis not present

## 2015-02-22 DIAGNOSIS — N186 End stage renal disease: Secondary | ICD-10-CM | POA: Diagnosis not present

## 2015-02-22 DIAGNOSIS — Z992 Dependence on renal dialysis: Secondary | ICD-10-CM | POA: Diagnosis not present

## 2015-02-22 DIAGNOSIS — I120 Hypertensive chronic kidney disease with stage 5 chronic kidney disease or end stage renal disease: Secondary | ICD-10-CM | POA: Diagnosis not present

## 2015-02-23 DIAGNOSIS — D631 Anemia in chronic kidney disease: Secondary | ICD-10-CM | POA: Diagnosis not present

## 2015-02-23 DIAGNOSIS — L299 Pruritus, unspecified: Secondary | ICD-10-CM | POA: Diagnosis not present

## 2015-02-23 DIAGNOSIS — N2581 Secondary hyperparathyroidism of renal origin: Secondary | ICD-10-CM | POA: Diagnosis not present

## 2015-02-23 DIAGNOSIS — Z992 Dependence on renal dialysis: Secondary | ICD-10-CM | POA: Diagnosis not present

## 2015-02-23 DIAGNOSIS — D509 Iron deficiency anemia, unspecified: Secondary | ICD-10-CM | POA: Diagnosis not present

## 2015-02-23 DIAGNOSIS — I120 Hypertensive chronic kidney disease with stage 5 chronic kidney disease or end stage renal disease: Secondary | ICD-10-CM | POA: Diagnosis not present

## 2015-02-23 DIAGNOSIS — N186 End stage renal disease: Secondary | ICD-10-CM | POA: Diagnosis not present

## 2015-02-24 DIAGNOSIS — N186 End stage renal disease: Secondary | ICD-10-CM | POA: Diagnosis not present

## 2015-02-24 DIAGNOSIS — I120 Hypertensive chronic kidney disease with stage 5 chronic kidney disease or end stage renal disease: Secondary | ICD-10-CM | POA: Diagnosis not present

## 2015-02-24 DIAGNOSIS — Z992 Dependence on renal dialysis: Secondary | ICD-10-CM | POA: Diagnosis not present

## 2015-02-25 DIAGNOSIS — I120 Hypertensive chronic kidney disease with stage 5 chronic kidney disease or end stage renal disease: Secondary | ICD-10-CM | POA: Diagnosis not present

## 2015-02-25 DIAGNOSIS — N186 End stage renal disease: Secondary | ICD-10-CM | POA: Diagnosis not present

## 2015-02-25 DIAGNOSIS — Z992 Dependence on renal dialysis: Secondary | ICD-10-CM | POA: Diagnosis not present

## 2015-02-26 DIAGNOSIS — I120 Hypertensive chronic kidney disease with stage 5 chronic kidney disease or end stage renal disease: Secondary | ICD-10-CM | POA: Diagnosis not present

## 2015-02-26 DIAGNOSIS — N2581 Secondary hyperparathyroidism of renal origin: Secondary | ICD-10-CM | POA: Diagnosis not present

## 2015-02-26 DIAGNOSIS — D509 Iron deficiency anemia, unspecified: Secondary | ICD-10-CM | POA: Diagnosis not present

## 2015-02-26 DIAGNOSIS — Z992 Dependence on renal dialysis: Secondary | ICD-10-CM | POA: Diagnosis not present

## 2015-02-26 DIAGNOSIS — N186 End stage renal disease: Secondary | ICD-10-CM | POA: Diagnosis not present

## 2015-02-26 DIAGNOSIS — L299 Pruritus, unspecified: Secondary | ICD-10-CM | POA: Diagnosis not present

## 2015-02-26 DIAGNOSIS — D631 Anemia in chronic kidney disease: Secondary | ICD-10-CM | POA: Diagnosis not present

## 2015-02-27 DIAGNOSIS — I120 Hypertensive chronic kidney disease with stage 5 chronic kidney disease or end stage renal disease: Secondary | ICD-10-CM | POA: Diagnosis not present

## 2015-02-27 DIAGNOSIS — N186 End stage renal disease: Secondary | ICD-10-CM | POA: Diagnosis not present

## 2015-02-27 DIAGNOSIS — Z992 Dependence on renal dialysis: Secondary | ICD-10-CM | POA: Diagnosis not present

## 2015-02-28 DIAGNOSIS — Z992 Dependence on renal dialysis: Secondary | ICD-10-CM | POA: Diagnosis not present

## 2015-02-28 DIAGNOSIS — D631 Anemia in chronic kidney disease: Secondary | ICD-10-CM | POA: Diagnosis not present

## 2015-02-28 DIAGNOSIS — N186 End stage renal disease: Secondary | ICD-10-CM | POA: Diagnosis not present

## 2015-02-28 DIAGNOSIS — I120 Hypertensive chronic kidney disease with stage 5 chronic kidney disease or end stage renal disease: Secondary | ICD-10-CM | POA: Diagnosis not present

## 2015-02-28 DIAGNOSIS — L299 Pruritus, unspecified: Secondary | ICD-10-CM | POA: Diagnosis not present

## 2015-02-28 DIAGNOSIS — D509 Iron deficiency anemia, unspecified: Secondary | ICD-10-CM | POA: Diagnosis not present

## 2015-02-28 DIAGNOSIS — N2581 Secondary hyperparathyroidism of renal origin: Secondary | ICD-10-CM | POA: Diagnosis not present

## 2015-03-01 DIAGNOSIS — I120 Hypertensive chronic kidney disease with stage 5 chronic kidney disease or end stage renal disease: Secondary | ICD-10-CM | POA: Diagnosis not present

## 2015-03-01 DIAGNOSIS — N186 End stage renal disease: Secondary | ICD-10-CM | POA: Diagnosis not present

## 2015-03-01 DIAGNOSIS — Z992 Dependence on renal dialysis: Secondary | ICD-10-CM | POA: Diagnosis not present

## 2015-03-02 DIAGNOSIS — D631 Anemia in chronic kidney disease: Secondary | ICD-10-CM | POA: Diagnosis not present

## 2015-03-02 DIAGNOSIS — N186 End stage renal disease: Secondary | ICD-10-CM | POA: Diagnosis not present

## 2015-03-02 DIAGNOSIS — D509 Iron deficiency anemia, unspecified: Secondary | ICD-10-CM | POA: Diagnosis not present

## 2015-03-02 DIAGNOSIS — N2581 Secondary hyperparathyroidism of renal origin: Secondary | ICD-10-CM | POA: Diagnosis not present

## 2015-03-02 DIAGNOSIS — L299 Pruritus, unspecified: Secondary | ICD-10-CM | POA: Diagnosis not present

## 2015-03-02 DIAGNOSIS — I120 Hypertensive chronic kidney disease with stage 5 chronic kidney disease or end stage renal disease: Secondary | ICD-10-CM | POA: Diagnosis not present

## 2015-03-02 DIAGNOSIS — Z992 Dependence on renal dialysis: Secondary | ICD-10-CM | POA: Diagnosis not present

## 2015-03-03 DIAGNOSIS — N186 End stage renal disease: Secondary | ICD-10-CM | POA: Diagnosis not present

## 2015-03-03 DIAGNOSIS — Z992 Dependence on renal dialysis: Secondary | ICD-10-CM | POA: Diagnosis not present

## 2015-03-03 DIAGNOSIS — I120 Hypertensive chronic kidney disease with stage 5 chronic kidney disease or end stage renal disease: Secondary | ICD-10-CM | POA: Diagnosis not present

## 2015-03-04 DIAGNOSIS — I120 Hypertensive chronic kidney disease with stage 5 chronic kidney disease or end stage renal disease: Secondary | ICD-10-CM | POA: Diagnosis not present

## 2015-03-04 DIAGNOSIS — N2581 Secondary hyperparathyroidism of renal origin: Secondary | ICD-10-CM | POA: Diagnosis not present

## 2015-03-04 DIAGNOSIS — Z992 Dependence on renal dialysis: Secondary | ICD-10-CM | POA: Diagnosis not present

## 2015-03-04 DIAGNOSIS — N186 End stage renal disease: Secondary | ICD-10-CM | POA: Diagnosis not present

## 2015-03-04 DIAGNOSIS — D631 Anemia in chronic kidney disease: Secondary | ICD-10-CM | POA: Diagnosis not present

## 2015-03-04 DIAGNOSIS — I10 Essential (primary) hypertension: Secondary | ICD-10-CM | POA: Diagnosis not present

## 2015-03-05 DIAGNOSIS — L299 Pruritus, unspecified: Secondary | ICD-10-CM | POA: Diagnosis not present

## 2015-03-05 DIAGNOSIS — N186 End stage renal disease: Secondary | ICD-10-CM | POA: Diagnosis not present

## 2015-03-05 DIAGNOSIS — Z992 Dependence on renal dialysis: Secondary | ICD-10-CM | POA: Diagnosis not present

## 2015-03-05 DIAGNOSIS — I120 Hypertensive chronic kidney disease with stage 5 chronic kidney disease or end stage renal disease: Secondary | ICD-10-CM | POA: Diagnosis not present

## 2015-03-05 DIAGNOSIS — D631 Anemia in chronic kidney disease: Secondary | ICD-10-CM | POA: Diagnosis not present

## 2015-03-05 DIAGNOSIS — N2581 Secondary hyperparathyroidism of renal origin: Secondary | ICD-10-CM | POA: Diagnosis not present

## 2015-03-05 DIAGNOSIS — D509 Iron deficiency anemia, unspecified: Secondary | ICD-10-CM | POA: Diagnosis not present

## 2015-03-06 DIAGNOSIS — Z992 Dependence on renal dialysis: Secondary | ICD-10-CM | POA: Diagnosis not present

## 2015-03-06 DIAGNOSIS — N186 End stage renal disease: Secondary | ICD-10-CM | POA: Diagnosis not present

## 2015-03-07 DIAGNOSIS — D509 Iron deficiency anemia, unspecified: Secondary | ICD-10-CM | POA: Diagnosis not present

## 2015-03-07 DIAGNOSIS — L299 Pruritus, unspecified: Secondary | ICD-10-CM | POA: Diagnosis not present

## 2015-03-07 DIAGNOSIS — N2581 Secondary hyperparathyroidism of renal origin: Secondary | ICD-10-CM | POA: Diagnosis not present

## 2015-03-07 DIAGNOSIS — N186 End stage renal disease: Secondary | ICD-10-CM | POA: Diagnosis not present

## 2015-03-07 DIAGNOSIS — D631 Anemia in chronic kidney disease: Secondary | ICD-10-CM | POA: Diagnosis not present

## 2015-03-08 ENCOUNTER — Ambulatory Visit: Payer: Medicare Other | Admitting: Family

## 2015-03-08 ENCOUNTER — Telehealth: Payer: Self-pay | Admitting: Family

## 2015-03-08 NOTE — Telephone Encounter (Signed)
Patient did not show for her initial appointment [facility] on 03[facility]/15. Will attempt to reschedule.  

## 2015-03-09 DIAGNOSIS — L299 Pruritus, unspecified: Secondary | ICD-10-CM | POA: Diagnosis not present

## 2015-03-09 DIAGNOSIS — N186 End stage renal disease: Secondary | ICD-10-CM | POA: Diagnosis not present

## 2015-03-09 DIAGNOSIS — D509 Iron deficiency anemia, unspecified: Secondary | ICD-10-CM | POA: Diagnosis not present

## 2015-03-09 DIAGNOSIS — N2581 Secondary hyperparathyroidism of renal origin: Secondary | ICD-10-CM | POA: Diagnosis not present

## 2015-03-09 DIAGNOSIS — D631 Anemia in chronic kidney disease: Secondary | ICD-10-CM | POA: Diagnosis not present

## 2015-03-12 DIAGNOSIS — L299 Pruritus, unspecified: Secondary | ICD-10-CM | POA: Diagnosis not present

## 2015-03-12 DIAGNOSIS — D631 Anemia in chronic kidney disease: Secondary | ICD-10-CM | POA: Diagnosis not present

## 2015-03-12 DIAGNOSIS — D509 Iron deficiency anemia, unspecified: Secondary | ICD-10-CM | POA: Diagnosis not present

## 2015-03-12 DIAGNOSIS — N2581 Secondary hyperparathyroidism of renal origin: Secondary | ICD-10-CM | POA: Diagnosis not present

## 2015-03-12 DIAGNOSIS — N186 End stage renal disease: Secondary | ICD-10-CM | POA: Diagnosis not present

## 2015-03-14 DIAGNOSIS — N186 End stage renal disease: Secondary | ICD-10-CM | POA: Diagnosis not present

## 2015-03-14 DIAGNOSIS — D631 Anemia in chronic kidney disease: Secondary | ICD-10-CM | POA: Diagnosis not present

## 2015-03-14 DIAGNOSIS — D509 Iron deficiency anemia, unspecified: Secondary | ICD-10-CM | POA: Diagnosis not present

## 2015-03-14 DIAGNOSIS — N2581 Secondary hyperparathyroidism of renal origin: Secondary | ICD-10-CM | POA: Diagnosis not present

## 2015-03-14 DIAGNOSIS — L299 Pruritus, unspecified: Secondary | ICD-10-CM | POA: Diagnosis not present

## 2015-03-16 DIAGNOSIS — L299 Pruritus, unspecified: Secondary | ICD-10-CM | POA: Diagnosis not present

## 2015-03-16 DIAGNOSIS — D631 Anemia in chronic kidney disease: Secondary | ICD-10-CM | POA: Diagnosis not present

## 2015-03-16 DIAGNOSIS — N2581 Secondary hyperparathyroidism of renal origin: Secondary | ICD-10-CM | POA: Diagnosis not present

## 2015-03-16 DIAGNOSIS — D509 Iron deficiency anemia, unspecified: Secondary | ICD-10-CM | POA: Diagnosis not present

## 2015-03-16 DIAGNOSIS — N186 End stage renal disease: Secondary | ICD-10-CM | POA: Diagnosis not present

## 2015-03-19 DIAGNOSIS — L299 Pruritus, unspecified: Secondary | ICD-10-CM | POA: Diagnosis not present

## 2015-03-19 DIAGNOSIS — N186 End stage renal disease: Secondary | ICD-10-CM | POA: Diagnosis not present

## 2015-03-19 DIAGNOSIS — N2581 Secondary hyperparathyroidism of renal origin: Secondary | ICD-10-CM | POA: Diagnosis not present

## 2015-03-19 DIAGNOSIS — D631 Anemia in chronic kidney disease: Secondary | ICD-10-CM | POA: Diagnosis not present

## 2015-03-19 DIAGNOSIS — D509 Iron deficiency anemia, unspecified: Secondary | ICD-10-CM | POA: Diagnosis not present

## 2015-03-21 DIAGNOSIS — L299 Pruritus, unspecified: Secondary | ICD-10-CM | POA: Diagnosis not present

## 2015-03-21 DIAGNOSIS — N186 End stage renal disease: Secondary | ICD-10-CM | POA: Diagnosis not present

## 2015-03-21 DIAGNOSIS — N2581 Secondary hyperparathyroidism of renal origin: Secondary | ICD-10-CM | POA: Diagnosis not present

## 2015-03-21 DIAGNOSIS — D509 Iron deficiency anemia, unspecified: Secondary | ICD-10-CM | POA: Diagnosis not present

## 2015-03-21 DIAGNOSIS — D631 Anemia in chronic kidney disease: Secondary | ICD-10-CM | POA: Diagnosis not present

## 2015-03-23 DIAGNOSIS — N186 End stage renal disease: Secondary | ICD-10-CM | POA: Diagnosis not present

## 2015-03-23 DIAGNOSIS — D631 Anemia in chronic kidney disease: Secondary | ICD-10-CM | POA: Diagnosis not present

## 2015-03-23 DIAGNOSIS — N2581 Secondary hyperparathyroidism of renal origin: Secondary | ICD-10-CM | POA: Diagnosis not present

## 2015-03-23 DIAGNOSIS — D509 Iron deficiency anemia, unspecified: Secondary | ICD-10-CM | POA: Diagnosis not present

## 2015-03-23 DIAGNOSIS — L299 Pruritus, unspecified: Secondary | ICD-10-CM | POA: Diagnosis not present

## 2015-03-26 DIAGNOSIS — L299 Pruritus, unspecified: Secondary | ICD-10-CM | POA: Diagnosis not present

## 2015-03-26 DIAGNOSIS — D631 Anemia in chronic kidney disease: Secondary | ICD-10-CM | POA: Diagnosis not present

## 2015-03-26 DIAGNOSIS — D509 Iron deficiency anemia, unspecified: Secondary | ICD-10-CM | POA: Diagnosis not present

## 2015-03-26 DIAGNOSIS — N2581 Secondary hyperparathyroidism of renal origin: Secondary | ICD-10-CM | POA: Diagnosis not present

## 2015-03-26 DIAGNOSIS — N186 End stage renal disease: Secondary | ICD-10-CM | POA: Diagnosis not present

## 2015-03-29 DIAGNOSIS — N2581 Secondary hyperparathyroidism of renal origin: Secondary | ICD-10-CM | POA: Diagnosis not present

## 2015-03-29 DIAGNOSIS — N186 End stage renal disease: Secondary | ICD-10-CM | POA: Diagnosis not present

## 2015-03-29 DIAGNOSIS — L299 Pruritus, unspecified: Secondary | ICD-10-CM | POA: Diagnosis not present

## 2015-03-29 DIAGNOSIS — D509 Iron deficiency anemia, unspecified: Secondary | ICD-10-CM | POA: Diagnosis not present

## 2015-03-29 DIAGNOSIS — D631 Anemia in chronic kidney disease: Secondary | ICD-10-CM | POA: Diagnosis not present

## 2015-03-30 DIAGNOSIS — D631 Anemia in chronic kidney disease: Secondary | ICD-10-CM | POA: Diagnosis not present

## 2015-03-30 DIAGNOSIS — N2581 Secondary hyperparathyroidism of renal origin: Secondary | ICD-10-CM | POA: Diagnosis not present

## 2015-03-30 DIAGNOSIS — D509 Iron deficiency anemia, unspecified: Secondary | ICD-10-CM | POA: Diagnosis not present

## 2015-03-30 DIAGNOSIS — L299 Pruritus, unspecified: Secondary | ICD-10-CM | POA: Diagnosis not present

## 2015-03-30 DIAGNOSIS — N186 End stage renal disease: Secondary | ICD-10-CM | POA: Diagnosis not present

## 2015-04-02 DIAGNOSIS — N2581 Secondary hyperparathyroidism of renal origin: Secondary | ICD-10-CM | POA: Diagnosis not present

## 2015-04-02 DIAGNOSIS — N186 End stage renal disease: Secondary | ICD-10-CM | POA: Diagnosis not present

## 2015-04-02 DIAGNOSIS — D631 Anemia in chronic kidney disease: Secondary | ICD-10-CM | POA: Diagnosis not present

## 2015-04-02 DIAGNOSIS — D509 Iron deficiency anemia, unspecified: Secondary | ICD-10-CM | POA: Diagnosis not present

## 2015-04-02 DIAGNOSIS — L299 Pruritus, unspecified: Secondary | ICD-10-CM | POA: Diagnosis not present

## 2015-04-03 DIAGNOSIS — N186 End stage renal disease: Secondary | ICD-10-CM | POA: Diagnosis not present

## 2015-04-03 DIAGNOSIS — Z992 Dependence on renal dialysis: Secondary | ICD-10-CM | POA: Diagnosis not present

## 2015-04-04 DIAGNOSIS — D509 Iron deficiency anemia, unspecified: Secondary | ICD-10-CM | POA: Diagnosis not present

## 2015-04-04 DIAGNOSIS — N186 End stage renal disease: Secondary | ICD-10-CM | POA: Diagnosis not present

## 2015-04-04 DIAGNOSIS — L299 Pruritus, unspecified: Secondary | ICD-10-CM | POA: Diagnosis not present

## 2015-04-04 DIAGNOSIS — D631 Anemia in chronic kidney disease: Secondary | ICD-10-CM | POA: Diagnosis not present

## 2015-04-04 DIAGNOSIS — N2581 Secondary hyperparathyroidism of renal origin: Secondary | ICD-10-CM | POA: Diagnosis not present

## 2015-04-06 DIAGNOSIS — D631 Anemia in chronic kidney disease: Secondary | ICD-10-CM | POA: Diagnosis not present

## 2015-04-06 DIAGNOSIS — L299 Pruritus, unspecified: Secondary | ICD-10-CM | POA: Diagnosis not present

## 2015-04-06 DIAGNOSIS — N2581 Secondary hyperparathyroidism of renal origin: Secondary | ICD-10-CM | POA: Diagnosis not present

## 2015-04-06 DIAGNOSIS — N186 End stage renal disease: Secondary | ICD-10-CM | POA: Diagnosis not present

## 2015-04-06 DIAGNOSIS — D509 Iron deficiency anemia, unspecified: Secondary | ICD-10-CM | POA: Diagnosis not present

## 2015-04-09 DIAGNOSIS — N2581 Secondary hyperparathyroidism of renal origin: Secondary | ICD-10-CM | POA: Diagnosis not present

## 2015-04-09 DIAGNOSIS — L299 Pruritus, unspecified: Secondary | ICD-10-CM | POA: Diagnosis not present

## 2015-04-09 DIAGNOSIS — D509 Iron deficiency anemia, unspecified: Secondary | ICD-10-CM | POA: Diagnosis not present

## 2015-04-09 DIAGNOSIS — N186 End stage renal disease: Secondary | ICD-10-CM | POA: Diagnosis not present

## 2015-04-09 DIAGNOSIS — D631 Anemia in chronic kidney disease: Secondary | ICD-10-CM | POA: Diagnosis not present

## 2015-04-11 DIAGNOSIS — D631 Anemia in chronic kidney disease: Secondary | ICD-10-CM | POA: Diagnosis not present

## 2015-04-11 DIAGNOSIS — N186 End stage renal disease: Secondary | ICD-10-CM | POA: Diagnosis not present

## 2015-04-11 DIAGNOSIS — N2581 Secondary hyperparathyroidism of renal origin: Secondary | ICD-10-CM | POA: Diagnosis not present

## 2015-04-11 DIAGNOSIS — D509 Iron deficiency anemia, unspecified: Secondary | ICD-10-CM | POA: Diagnosis not present

## 2015-04-11 DIAGNOSIS — L299 Pruritus, unspecified: Secondary | ICD-10-CM | POA: Diagnosis not present

## 2015-04-13 DIAGNOSIS — N186 End stage renal disease: Secondary | ICD-10-CM | POA: Diagnosis not present

## 2015-04-13 DIAGNOSIS — L299 Pruritus, unspecified: Secondary | ICD-10-CM | POA: Diagnosis not present

## 2015-04-13 DIAGNOSIS — N2581 Secondary hyperparathyroidism of renal origin: Secondary | ICD-10-CM | POA: Diagnosis not present

## 2015-04-13 DIAGNOSIS — D631 Anemia in chronic kidney disease: Secondary | ICD-10-CM | POA: Diagnosis not present

## 2015-04-13 DIAGNOSIS — D509 Iron deficiency anemia, unspecified: Secondary | ICD-10-CM | POA: Diagnosis not present

## 2015-04-16 DIAGNOSIS — N186 End stage renal disease: Secondary | ICD-10-CM | POA: Diagnosis not present

## 2015-04-16 DIAGNOSIS — L299 Pruritus, unspecified: Secondary | ICD-10-CM | POA: Diagnosis not present

## 2015-04-16 DIAGNOSIS — D631 Anemia in chronic kidney disease: Secondary | ICD-10-CM | POA: Diagnosis not present

## 2015-04-16 DIAGNOSIS — N2581 Secondary hyperparathyroidism of renal origin: Secondary | ICD-10-CM | POA: Diagnosis not present

## 2015-04-16 DIAGNOSIS — D509 Iron deficiency anemia, unspecified: Secondary | ICD-10-CM | POA: Diagnosis not present

## 2015-04-18 DIAGNOSIS — L299 Pruritus, unspecified: Secondary | ICD-10-CM | POA: Diagnosis not present

## 2015-04-18 DIAGNOSIS — N2581 Secondary hyperparathyroidism of renal origin: Secondary | ICD-10-CM | POA: Diagnosis not present

## 2015-04-18 DIAGNOSIS — D631 Anemia in chronic kidney disease: Secondary | ICD-10-CM | POA: Diagnosis not present

## 2015-04-18 DIAGNOSIS — N186 End stage renal disease: Secondary | ICD-10-CM | POA: Diagnosis not present

## 2015-04-18 DIAGNOSIS — D509 Iron deficiency anemia, unspecified: Secondary | ICD-10-CM | POA: Diagnosis not present

## 2015-04-20 DIAGNOSIS — D509 Iron deficiency anemia, unspecified: Secondary | ICD-10-CM | POA: Diagnosis not present

## 2015-04-20 DIAGNOSIS — N186 End stage renal disease: Secondary | ICD-10-CM | POA: Diagnosis not present

## 2015-04-20 DIAGNOSIS — L299 Pruritus, unspecified: Secondary | ICD-10-CM | POA: Diagnosis not present

## 2015-04-20 DIAGNOSIS — N2581 Secondary hyperparathyroidism of renal origin: Secondary | ICD-10-CM | POA: Diagnosis not present

## 2015-04-20 DIAGNOSIS — D631 Anemia in chronic kidney disease: Secondary | ICD-10-CM | POA: Diagnosis not present

## 2015-04-23 DIAGNOSIS — N186 End stage renal disease: Secondary | ICD-10-CM | POA: Diagnosis not present

## 2015-04-23 DIAGNOSIS — D631 Anemia in chronic kidney disease: Secondary | ICD-10-CM | POA: Diagnosis not present

## 2015-04-23 DIAGNOSIS — N2581 Secondary hyperparathyroidism of renal origin: Secondary | ICD-10-CM | POA: Diagnosis not present

## 2015-04-23 DIAGNOSIS — L299 Pruritus, unspecified: Secondary | ICD-10-CM | POA: Diagnosis not present

## 2015-04-23 DIAGNOSIS — D509 Iron deficiency anemia, unspecified: Secondary | ICD-10-CM | POA: Diagnosis not present

## 2015-04-25 DIAGNOSIS — L299 Pruritus, unspecified: Secondary | ICD-10-CM | POA: Diagnosis not present

## 2015-04-25 DIAGNOSIS — D509 Iron deficiency anemia, unspecified: Secondary | ICD-10-CM | POA: Diagnosis not present

## 2015-04-25 DIAGNOSIS — N186 End stage renal disease: Secondary | ICD-10-CM | POA: Diagnosis not present

## 2015-04-25 DIAGNOSIS — D631 Anemia in chronic kidney disease: Secondary | ICD-10-CM | POA: Diagnosis not present

## 2015-04-25 DIAGNOSIS — N2581 Secondary hyperparathyroidism of renal origin: Secondary | ICD-10-CM | POA: Diagnosis not present

## 2015-04-27 DIAGNOSIS — N2581 Secondary hyperparathyroidism of renal origin: Secondary | ICD-10-CM | POA: Diagnosis not present

## 2015-04-27 DIAGNOSIS — D631 Anemia in chronic kidney disease: Secondary | ICD-10-CM | POA: Diagnosis not present

## 2015-04-27 DIAGNOSIS — L299 Pruritus, unspecified: Secondary | ICD-10-CM | POA: Diagnosis not present

## 2015-04-27 DIAGNOSIS — D509 Iron deficiency anemia, unspecified: Secondary | ICD-10-CM | POA: Diagnosis not present

## 2015-04-27 DIAGNOSIS — N186 End stage renal disease: Secondary | ICD-10-CM | POA: Diagnosis not present

## 2015-04-30 DIAGNOSIS — L299 Pruritus, unspecified: Secondary | ICD-10-CM | POA: Diagnosis not present

## 2015-04-30 DIAGNOSIS — D509 Iron deficiency anemia, unspecified: Secondary | ICD-10-CM | POA: Diagnosis not present

## 2015-04-30 DIAGNOSIS — N186 End stage renal disease: Secondary | ICD-10-CM | POA: Diagnosis not present

## 2015-04-30 DIAGNOSIS — N2581 Secondary hyperparathyroidism of renal origin: Secondary | ICD-10-CM | POA: Diagnosis not present

## 2015-04-30 DIAGNOSIS — D631 Anemia in chronic kidney disease: Secondary | ICD-10-CM | POA: Diagnosis not present

## 2015-05-02 DIAGNOSIS — N2581 Secondary hyperparathyroidism of renal origin: Secondary | ICD-10-CM | POA: Diagnosis not present

## 2015-05-02 DIAGNOSIS — D509 Iron deficiency anemia, unspecified: Secondary | ICD-10-CM | POA: Diagnosis not present

## 2015-05-02 DIAGNOSIS — D631 Anemia in chronic kidney disease: Secondary | ICD-10-CM | POA: Diagnosis not present

## 2015-05-02 DIAGNOSIS — L299 Pruritus, unspecified: Secondary | ICD-10-CM | POA: Diagnosis not present

## 2015-05-02 DIAGNOSIS — N186 End stage renal disease: Secondary | ICD-10-CM | POA: Diagnosis not present

## 2015-05-04 DIAGNOSIS — D631 Anemia in chronic kidney disease: Secondary | ICD-10-CM | POA: Diagnosis not present

## 2015-05-04 DIAGNOSIS — D509 Iron deficiency anemia, unspecified: Secondary | ICD-10-CM | POA: Diagnosis not present

## 2015-05-04 DIAGNOSIS — L299 Pruritus, unspecified: Secondary | ICD-10-CM | POA: Diagnosis not present

## 2015-05-04 DIAGNOSIS — N2581 Secondary hyperparathyroidism of renal origin: Secondary | ICD-10-CM | POA: Diagnosis not present

## 2015-05-04 DIAGNOSIS — N186 End stage renal disease: Secondary | ICD-10-CM | POA: Diagnosis not present

## 2015-05-07 DIAGNOSIS — L299 Pruritus, unspecified: Secondary | ICD-10-CM | POA: Diagnosis not present

## 2015-05-07 DIAGNOSIS — N186 End stage renal disease: Secondary | ICD-10-CM | POA: Diagnosis not present

## 2015-05-07 DIAGNOSIS — D509 Iron deficiency anemia, unspecified: Secondary | ICD-10-CM | POA: Diagnosis not present

## 2015-05-07 DIAGNOSIS — N2581 Secondary hyperparathyroidism of renal origin: Secondary | ICD-10-CM | POA: Diagnosis not present

## 2015-05-07 DIAGNOSIS — D631 Anemia in chronic kidney disease: Secondary | ICD-10-CM | POA: Diagnosis not present

## 2015-05-09 DIAGNOSIS — N2581 Secondary hyperparathyroidism of renal origin: Secondary | ICD-10-CM | POA: Diagnosis not present

## 2015-05-09 DIAGNOSIS — D631 Anemia in chronic kidney disease: Secondary | ICD-10-CM | POA: Diagnosis not present

## 2015-05-09 DIAGNOSIS — D509 Iron deficiency anemia, unspecified: Secondary | ICD-10-CM | POA: Diagnosis not present

## 2015-05-09 DIAGNOSIS — N186 End stage renal disease: Secondary | ICD-10-CM | POA: Diagnosis not present

## 2015-05-09 DIAGNOSIS — L299 Pruritus, unspecified: Secondary | ICD-10-CM | POA: Diagnosis not present

## 2015-05-11 DIAGNOSIS — N186 End stage renal disease: Secondary | ICD-10-CM | POA: Diagnosis not present

## 2015-05-11 DIAGNOSIS — D631 Anemia in chronic kidney disease: Secondary | ICD-10-CM | POA: Diagnosis not present

## 2015-05-11 DIAGNOSIS — N2581 Secondary hyperparathyroidism of renal origin: Secondary | ICD-10-CM | POA: Diagnosis not present

## 2015-05-11 DIAGNOSIS — L299 Pruritus, unspecified: Secondary | ICD-10-CM | POA: Diagnosis not present

## 2015-05-11 DIAGNOSIS — D509 Iron deficiency anemia, unspecified: Secondary | ICD-10-CM | POA: Diagnosis not present

## 2015-05-13 DIAGNOSIS — J984 Other disorders of lung: Secondary | ICD-10-CM | POA: Diagnosis not present

## 2015-05-13 DIAGNOSIS — J9601 Acute respiratory failure with hypoxia: Secondary | ICD-10-CM | POA: Diagnosis not present

## 2015-05-14 DIAGNOSIS — D631 Anemia in chronic kidney disease: Secondary | ICD-10-CM | POA: Diagnosis not present

## 2015-05-14 DIAGNOSIS — N186 End stage renal disease: Secondary | ICD-10-CM | POA: Diagnosis not present

## 2015-05-14 DIAGNOSIS — N2581 Secondary hyperparathyroidism of renal origin: Secondary | ICD-10-CM | POA: Diagnosis not present

## 2015-05-14 DIAGNOSIS — D509 Iron deficiency anemia, unspecified: Secondary | ICD-10-CM | POA: Diagnosis not present

## 2015-05-14 DIAGNOSIS — L299 Pruritus, unspecified: Secondary | ICD-10-CM | POA: Diagnosis not present

## 2015-05-16 DIAGNOSIS — D631 Anemia in chronic kidney disease: Secondary | ICD-10-CM | POA: Diagnosis not present

## 2015-05-16 DIAGNOSIS — D509 Iron deficiency anemia, unspecified: Secondary | ICD-10-CM | POA: Diagnosis not present

## 2015-05-16 DIAGNOSIS — L299 Pruritus, unspecified: Secondary | ICD-10-CM | POA: Diagnosis not present

## 2015-05-16 DIAGNOSIS — N2581 Secondary hyperparathyroidism of renal origin: Secondary | ICD-10-CM | POA: Diagnosis not present

## 2015-05-16 DIAGNOSIS — N186 End stage renal disease: Secondary | ICD-10-CM | POA: Diagnosis not present

## 2015-05-18 DIAGNOSIS — N2581 Secondary hyperparathyroidism of renal origin: Secondary | ICD-10-CM | POA: Diagnosis not present

## 2015-05-18 DIAGNOSIS — N186 End stage renal disease: Secondary | ICD-10-CM | POA: Diagnosis not present

## 2015-05-18 DIAGNOSIS — D509 Iron deficiency anemia, unspecified: Secondary | ICD-10-CM | POA: Diagnosis not present

## 2015-05-18 DIAGNOSIS — L299 Pruritus, unspecified: Secondary | ICD-10-CM | POA: Diagnosis not present

## 2015-05-18 DIAGNOSIS — D631 Anemia in chronic kidney disease: Secondary | ICD-10-CM | POA: Diagnosis not present

## 2015-05-21 DIAGNOSIS — D631 Anemia in chronic kidney disease: Secondary | ICD-10-CM | POA: Diagnosis not present

## 2015-05-21 DIAGNOSIS — N2581 Secondary hyperparathyroidism of renal origin: Secondary | ICD-10-CM | POA: Diagnosis not present

## 2015-05-21 DIAGNOSIS — N186 End stage renal disease: Secondary | ICD-10-CM | POA: Diagnosis not present

## 2015-05-21 DIAGNOSIS — L299 Pruritus, unspecified: Secondary | ICD-10-CM | POA: Diagnosis not present

## 2015-05-21 DIAGNOSIS — D509 Iron deficiency anemia, unspecified: Secondary | ICD-10-CM | POA: Diagnosis not present

## 2015-05-23 DIAGNOSIS — N2581 Secondary hyperparathyroidism of renal origin: Secondary | ICD-10-CM | POA: Diagnosis not present

## 2015-05-23 DIAGNOSIS — D509 Iron deficiency anemia, unspecified: Secondary | ICD-10-CM | POA: Diagnosis not present

## 2015-05-23 DIAGNOSIS — D631 Anemia in chronic kidney disease: Secondary | ICD-10-CM | POA: Diagnosis not present

## 2015-05-23 DIAGNOSIS — L299 Pruritus, unspecified: Secondary | ICD-10-CM | POA: Diagnosis not present

## 2015-05-23 DIAGNOSIS — N186 End stage renal disease: Secondary | ICD-10-CM | POA: Diagnosis not present

## 2015-05-25 DIAGNOSIS — L299 Pruritus, unspecified: Secondary | ICD-10-CM | POA: Diagnosis not present

## 2015-05-25 DIAGNOSIS — N2581 Secondary hyperparathyroidism of renal origin: Secondary | ICD-10-CM | POA: Diagnosis not present

## 2015-05-25 DIAGNOSIS — D631 Anemia in chronic kidney disease: Secondary | ICD-10-CM | POA: Diagnosis not present

## 2015-05-25 DIAGNOSIS — N186 End stage renal disease: Secondary | ICD-10-CM | POA: Diagnosis not present

## 2015-05-25 DIAGNOSIS — D509 Iron deficiency anemia, unspecified: Secondary | ICD-10-CM | POA: Diagnosis not present

## 2015-05-28 DIAGNOSIS — D509 Iron deficiency anemia, unspecified: Secondary | ICD-10-CM | POA: Diagnosis not present

## 2015-05-28 DIAGNOSIS — L299 Pruritus, unspecified: Secondary | ICD-10-CM | POA: Diagnosis not present

## 2015-05-28 DIAGNOSIS — N2581 Secondary hyperparathyroidism of renal origin: Secondary | ICD-10-CM | POA: Diagnosis not present

## 2015-05-28 DIAGNOSIS — N186 End stage renal disease: Secondary | ICD-10-CM | POA: Diagnosis not present

## 2015-05-28 DIAGNOSIS — D631 Anemia in chronic kidney disease: Secondary | ICD-10-CM | POA: Diagnosis not present

## 2015-05-30 DIAGNOSIS — D631 Anemia in chronic kidney disease: Secondary | ICD-10-CM | POA: Diagnosis not present

## 2015-05-30 DIAGNOSIS — N2581 Secondary hyperparathyroidism of renal origin: Secondary | ICD-10-CM | POA: Diagnosis not present

## 2015-05-30 DIAGNOSIS — L299 Pruritus, unspecified: Secondary | ICD-10-CM | POA: Diagnosis not present

## 2015-05-30 DIAGNOSIS — N186 End stage renal disease: Secondary | ICD-10-CM | POA: Diagnosis not present

## 2015-05-30 DIAGNOSIS — D509 Iron deficiency anemia, unspecified: Secondary | ICD-10-CM | POA: Diagnosis not present

## 2015-06-01 DIAGNOSIS — N186 End stage renal disease: Secondary | ICD-10-CM | POA: Diagnosis not present

## 2015-06-01 DIAGNOSIS — N2581 Secondary hyperparathyroidism of renal origin: Secondary | ICD-10-CM | POA: Diagnosis not present

## 2015-06-01 DIAGNOSIS — D631 Anemia in chronic kidney disease: Secondary | ICD-10-CM | POA: Diagnosis not present

## 2015-06-01 DIAGNOSIS — D509 Iron deficiency anemia, unspecified: Secondary | ICD-10-CM | POA: Diagnosis not present

## 2015-06-01 DIAGNOSIS — L299 Pruritus, unspecified: Secondary | ICD-10-CM | POA: Diagnosis not present

## 2015-06-02 DIAGNOSIS — N186 End stage renal disease: Secondary | ICD-10-CM | POA: Diagnosis not present

## 2015-06-02 DIAGNOSIS — Z992 Dependence on renal dialysis: Secondary | ICD-10-CM | POA: Diagnosis not present

## 2015-06-04 DIAGNOSIS — L299 Pruritus, unspecified: Secondary | ICD-10-CM | POA: Diagnosis not present

## 2015-06-04 DIAGNOSIS — N2581 Secondary hyperparathyroidism of renal origin: Secondary | ICD-10-CM | POA: Diagnosis not present

## 2015-06-04 DIAGNOSIS — D631 Anemia in chronic kidney disease: Secondary | ICD-10-CM | POA: Diagnosis not present

## 2015-06-04 DIAGNOSIS — D509 Iron deficiency anemia, unspecified: Secondary | ICD-10-CM | POA: Diagnosis not present

## 2015-06-04 DIAGNOSIS — N186 End stage renal disease: Secondary | ICD-10-CM | POA: Diagnosis not present

## 2015-06-06 DIAGNOSIS — L299 Pruritus, unspecified: Secondary | ICD-10-CM | POA: Diagnosis not present

## 2015-06-06 DIAGNOSIS — D509 Iron deficiency anemia, unspecified: Secondary | ICD-10-CM | POA: Diagnosis not present

## 2015-06-06 DIAGNOSIS — N2581 Secondary hyperparathyroidism of renal origin: Secondary | ICD-10-CM | POA: Diagnosis not present

## 2015-06-06 DIAGNOSIS — D631 Anemia in chronic kidney disease: Secondary | ICD-10-CM | POA: Diagnosis not present

## 2015-06-06 DIAGNOSIS — N186 End stage renal disease: Secondary | ICD-10-CM | POA: Diagnosis not present

## 2015-06-08 DIAGNOSIS — D509 Iron deficiency anemia, unspecified: Secondary | ICD-10-CM | POA: Diagnosis not present

## 2015-06-08 DIAGNOSIS — L299 Pruritus, unspecified: Secondary | ICD-10-CM | POA: Diagnosis not present

## 2015-06-08 DIAGNOSIS — N186 End stage renal disease: Secondary | ICD-10-CM | POA: Diagnosis not present

## 2015-06-08 DIAGNOSIS — N2581 Secondary hyperparathyroidism of renal origin: Secondary | ICD-10-CM | POA: Diagnosis not present

## 2015-06-08 DIAGNOSIS — D631 Anemia in chronic kidney disease: Secondary | ICD-10-CM | POA: Diagnosis not present

## 2015-06-11 DIAGNOSIS — D631 Anemia in chronic kidney disease: Secondary | ICD-10-CM | POA: Diagnosis not present

## 2015-06-11 DIAGNOSIS — L299 Pruritus, unspecified: Secondary | ICD-10-CM | POA: Diagnosis not present

## 2015-06-11 DIAGNOSIS — N2581 Secondary hyperparathyroidism of renal origin: Secondary | ICD-10-CM | POA: Diagnosis not present

## 2015-06-11 DIAGNOSIS — D509 Iron deficiency anemia, unspecified: Secondary | ICD-10-CM | POA: Diagnosis not present

## 2015-06-11 DIAGNOSIS — N186 End stage renal disease: Secondary | ICD-10-CM | POA: Diagnosis not present

## 2015-06-13 DIAGNOSIS — D509 Iron deficiency anemia, unspecified: Secondary | ICD-10-CM | POA: Diagnosis not present

## 2015-06-13 DIAGNOSIS — N186 End stage renal disease: Secondary | ICD-10-CM | POA: Diagnosis not present

## 2015-06-13 DIAGNOSIS — L299 Pruritus, unspecified: Secondary | ICD-10-CM | POA: Diagnosis not present

## 2015-06-13 DIAGNOSIS — N2581 Secondary hyperparathyroidism of renal origin: Secondary | ICD-10-CM | POA: Diagnosis not present

## 2015-06-13 DIAGNOSIS — D631 Anemia in chronic kidney disease: Secondary | ICD-10-CM | POA: Diagnosis not present

## 2015-06-15 DIAGNOSIS — N186 End stage renal disease: Secondary | ICD-10-CM | POA: Diagnosis not present

## 2015-06-15 DIAGNOSIS — D509 Iron deficiency anemia, unspecified: Secondary | ICD-10-CM | POA: Diagnosis not present

## 2015-06-15 DIAGNOSIS — N2581 Secondary hyperparathyroidism of renal origin: Secondary | ICD-10-CM | POA: Diagnosis not present

## 2015-06-15 DIAGNOSIS — D631 Anemia in chronic kidney disease: Secondary | ICD-10-CM | POA: Diagnosis not present

## 2015-06-15 DIAGNOSIS — L299 Pruritus, unspecified: Secondary | ICD-10-CM | POA: Diagnosis not present

## 2015-06-18 DIAGNOSIS — D509 Iron deficiency anemia, unspecified: Secondary | ICD-10-CM | POA: Diagnosis not present

## 2015-06-18 DIAGNOSIS — D631 Anemia in chronic kidney disease: Secondary | ICD-10-CM | POA: Diagnosis not present

## 2015-06-18 DIAGNOSIS — L299 Pruritus, unspecified: Secondary | ICD-10-CM | POA: Diagnosis not present

## 2015-06-18 DIAGNOSIS — N186 End stage renal disease: Secondary | ICD-10-CM | POA: Diagnosis not present

## 2015-06-18 DIAGNOSIS — N2581 Secondary hyperparathyroidism of renal origin: Secondary | ICD-10-CM | POA: Diagnosis not present

## 2015-06-20 DIAGNOSIS — N2581 Secondary hyperparathyroidism of renal origin: Secondary | ICD-10-CM | POA: Diagnosis not present

## 2015-06-20 DIAGNOSIS — L299 Pruritus, unspecified: Secondary | ICD-10-CM | POA: Diagnosis not present

## 2015-06-20 DIAGNOSIS — D631 Anemia in chronic kidney disease: Secondary | ICD-10-CM | POA: Diagnosis not present

## 2015-06-20 DIAGNOSIS — N186 End stage renal disease: Secondary | ICD-10-CM | POA: Diagnosis not present

## 2015-06-20 DIAGNOSIS — D509 Iron deficiency anemia, unspecified: Secondary | ICD-10-CM | POA: Diagnosis not present

## 2015-06-22 DIAGNOSIS — N186 End stage renal disease: Secondary | ICD-10-CM | POA: Diagnosis not present

## 2015-06-22 DIAGNOSIS — L299 Pruritus, unspecified: Secondary | ICD-10-CM | POA: Diagnosis not present

## 2015-06-22 DIAGNOSIS — D509 Iron deficiency anemia, unspecified: Secondary | ICD-10-CM | POA: Diagnosis not present

## 2015-06-22 DIAGNOSIS — D631 Anemia in chronic kidney disease: Secondary | ICD-10-CM | POA: Diagnosis not present

## 2015-06-22 DIAGNOSIS — N2581 Secondary hyperparathyroidism of renal origin: Secondary | ICD-10-CM | POA: Diagnosis not present

## 2015-06-25 DIAGNOSIS — D509 Iron deficiency anemia, unspecified: Secondary | ICD-10-CM | POA: Diagnosis not present

## 2015-06-25 DIAGNOSIS — N2581 Secondary hyperparathyroidism of renal origin: Secondary | ICD-10-CM | POA: Diagnosis not present

## 2015-06-25 DIAGNOSIS — D631 Anemia in chronic kidney disease: Secondary | ICD-10-CM | POA: Diagnosis not present

## 2015-06-25 DIAGNOSIS — N186 End stage renal disease: Secondary | ICD-10-CM | POA: Diagnosis not present

## 2015-06-25 DIAGNOSIS — L299 Pruritus, unspecified: Secondary | ICD-10-CM | POA: Diagnosis not present

## 2015-06-27 DIAGNOSIS — D631 Anemia in chronic kidney disease: Secondary | ICD-10-CM | POA: Diagnosis not present

## 2015-06-27 DIAGNOSIS — L299 Pruritus, unspecified: Secondary | ICD-10-CM | POA: Diagnosis not present

## 2015-06-27 DIAGNOSIS — D509 Iron deficiency anemia, unspecified: Secondary | ICD-10-CM | POA: Diagnosis not present

## 2015-06-27 DIAGNOSIS — N186 End stage renal disease: Secondary | ICD-10-CM | POA: Diagnosis not present

## 2015-06-27 DIAGNOSIS — N2581 Secondary hyperparathyroidism of renal origin: Secondary | ICD-10-CM | POA: Diagnosis not present

## 2015-06-29 DIAGNOSIS — L299 Pruritus, unspecified: Secondary | ICD-10-CM | POA: Diagnosis not present

## 2015-06-29 DIAGNOSIS — N2581 Secondary hyperparathyroidism of renal origin: Secondary | ICD-10-CM | POA: Diagnosis not present

## 2015-06-29 DIAGNOSIS — D631 Anemia in chronic kidney disease: Secondary | ICD-10-CM | POA: Diagnosis not present

## 2015-06-29 DIAGNOSIS — N186 End stage renal disease: Secondary | ICD-10-CM | POA: Diagnosis not present

## 2015-06-29 DIAGNOSIS — D509 Iron deficiency anemia, unspecified: Secondary | ICD-10-CM | POA: Diagnosis not present

## 2015-07-02 DIAGNOSIS — N2581 Secondary hyperparathyroidism of renal origin: Secondary | ICD-10-CM | POA: Diagnosis not present

## 2015-07-02 DIAGNOSIS — N186 End stage renal disease: Secondary | ICD-10-CM | POA: Diagnosis not present

## 2015-07-02 DIAGNOSIS — L299 Pruritus, unspecified: Secondary | ICD-10-CM | POA: Diagnosis not present

## 2015-07-02 DIAGNOSIS — D509 Iron deficiency anemia, unspecified: Secondary | ICD-10-CM | POA: Diagnosis not present

## 2015-07-02 DIAGNOSIS — D631 Anemia in chronic kidney disease: Secondary | ICD-10-CM | POA: Diagnosis not present

## 2015-07-03 DIAGNOSIS — Z992 Dependence on renal dialysis: Secondary | ICD-10-CM | POA: Diagnosis not present

## 2015-07-03 DIAGNOSIS — N186 End stage renal disease: Secondary | ICD-10-CM | POA: Diagnosis not present

## 2015-07-04 DIAGNOSIS — N186 End stage renal disease: Secondary | ICD-10-CM | POA: Diagnosis not present

## 2015-07-04 DIAGNOSIS — L299 Pruritus, unspecified: Secondary | ICD-10-CM | POA: Diagnosis not present

## 2015-07-04 DIAGNOSIS — D509 Iron deficiency anemia, unspecified: Secondary | ICD-10-CM | POA: Diagnosis not present

## 2015-07-04 DIAGNOSIS — N2581 Secondary hyperparathyroidism of renal origin: Secondary | ICD-10-CM | POA: Diagnosis not present

## 2015-07-04 DIAGNOSIS — D631 Anemia in chronic kidney disease: Secondary | ICD-10-CM | POA: Diagnosis not present

## 2015-07-06 DIAGNOSIS — D509 Iron deficiency anemia, unspecified: Secondary | ICD-10-CM | POA: Diagnosis not present

## 2015-07-06 DIAGNOSIS — L299 Pruritus, unspecified: Secondary | ICD-10-CM | POA: Diagnosis not present

## 2015-07-06 DIAGNOSIS — N186 End stage renal disease: Secondary | ICD-10-CM | POA: Diagnosis not present

## 2015-07-06 DIAGNOSIS — D631 Anemia in chronic kidney disease: Secondary | ICD-10-CM | POA: Diagnosis not present

## 2015-07-06 DIAGNOSIS — N2581 Secondary hyperparathyroidism of renal origin: Secondary | ICD-10-CM | POA: Diagnosis not present

## 2015-07-09 DIAGNOSIS — L299 Pruritus, unspecified: Secondary | ICD-10-CM | POA: Diagnosis not present

## 2015-07-09 DIAGNOSIS — N2581 Secondary hyperparathyroidism of renal origin: Secondary | ICD-10-CM | POA: Diagnosis not present

## 2015-07-09 DIAGNOSIS — D509 Iron deficiency anemia, unspecified: Secondary | ICD-10-CM | POA: Diagnosis not present

## 2015-07-09 DIAGNOSIS — D631 Anemia in chronic kidney disease: Secondary | ICD-10-CM | POA: Diagnosis not present

## 2015-07-09 DIAGNOSIS — N186 End stage renal disease: Secondary | ICD-10-CM | POA: Diagnosis not present

## 2015-07-11 DIAGNOSIS — N186 End stage renal disease: Secondary | ICD-10-CM | POA: Diagnosis not present

## 2015-07-11 DIAGNOSIS — N2581 Secondary hyperparathyroidism of renal origin: Secondary | ICD-10-CM | POA: Diagnosis not present

## 2015-07-11 DIAGNOSIS — L299 Pruritus, unspecified: Secondary | ICD-10-CM | POA: Diagnosis not present

## 2015-07-11 DIAGNOSIS — D631 Anemia in chronic kidney disease: Secondary | ICD-10-CM | POA: Diagnosis not present

## 2015-07-11 DIAGNOSIS — D509 Iron deficiency anemia, unspecified: Secondary | ICD-10-CM | POA: Diagnosis not present

## 2015-07-13 DIAGNOSIS — D631 Anemia in chronic kidney disease: Secondary | ICD-10-CM | POA: Diagnosis not present

## 2015-07-13 DIAGNOSIS — D509 Iron deficiency anemia, unspecified: Secondary | ICD-10-CM | POA: Diagnosis not present

## 2015-07-13 DIAGNOSIS — N186 End stage renal disease: Secondary | ICD-10-CM | POA: Diagnosis not present

## 2015-07-13 DIAGNOSIS — N2581 Secondary hyperparathyroidism of renal origin: Secondary | ICD-10-CM | POA: Diagnosis not present

## 2015-07-13 DIAGNOSIS — L299 Pruritus, unspecified: Secondary | ICD-10-CM | POA: Diagnosis not present

## 2015-07-15 DIAGNOSIS — N186 End stage renal disease: Secondary | ICD-10-CM | POA: Diagnosis not present

## 2015-07-15 DIAGNOSIS — D631 Anemia in chronic kidney disease: Secondary | ICD-10-CM | POA: Diagnosis not present

## 2015-07-15 DIAGNOSIS — L299 Pruritus, unspecified: Secondary | ICD-10-CM | POA: Diagnosis not present

## 2015-07-15 DIAGNOSIS — N2581 Secondary hyperparathyroidism of renal origin: Secondary | ICD-10-CM | POA: Diagnosis not present

## 2015-07-15 DIAGNOSIS — D509 Iron deficiency anemia, unspecified: Secondary | ICD-10-CM | POA: Diagnosis not present

## 2015-07-18 DIAGNOSIS — D509 Iron deficiency anemia, unspecified: Secondary | ICD-10-CM | POA: Diagnosis not present

## 2015-07-18 DIAGNOSIS — D631 Anemia in chronic kidney disease: Secondary | ICD-10-CM | POA: Diagnosis not present

## 2015-07-18 DIAGNOSIS — N186 End stage renal disease: Secondary | ICD-10-CM | POA: Diagnosis not present

## 2015-07-18 DIAGNOSIS — L299 Pruritus, unspecified: Secondary | ICD-10-CM | POA: Diagnosis not present

## 2015-07-18 DIAGNOSIS — N2581 Secondary hyperparathyroidism of renal origin: Secondary | ICD-10-CM | POA: Diagnosis not present

## 2015-07-20 DIAGNOSIS — N2581 Secondary hyperparathyroidism of renal origin: Secondary | ICD-10-CM | POA: Diagnosis not present

## 2015-07-20 DIAGNOSIS — D631 Anemia in chronic kidney disease: Secondary | ICD-10-CM | POA: Diagnosis not present

## 2015-07-20 DIAGNOSIS — D509 Iron deficiency anemia, unspecified: Secondary | ICD-10-CM | POA: Diagnosis not present

## 2015-07-20 DIAGNOSIS — N186 End stage renal disease: Secondary | ICD-10-CM | POA: Diagnosis not present

## 2015-07-20 DIAGNOSIS — L299 Pruritus, unspecified: Secondary | ICD-10-CM | POA: Diagnosis not present

## 2015-07-23 DIAGNOSIS — N186 End stage renal disease: Secondary | ICD-10-CM | POA: Diagnosis not present

## 2015-07-23 DIAGNOSIS — D509 Iron deficiency anemia, unspecified: Secondary | ICD-10-CM | POA: Diagnosis not present

## 2015-07-23 DIAGNOSIS — L299 Pruritus, unspecified: Secondary | ICD-10-CM | POA: Diagnosis not present

## 2015-07-23 DIAGNOSIS — D631 Anemia in chronic kidney disease: Secondary | ICD-10-CM | POA: Diagnosis not present

## 2015-07-23 DIAGNOSIS — N2581 Secondary hyperparathyroidism of renal origin: Secondary | ICD-10-CM | POA: Diagnosis not present

## 2015-07-25 DIAGNOSIS — N2581 Secondary hyperparathyroidism of renal origin: Secondary | ICD-10-CM | POA: Diagnosis not present

## 2015-07-25 DIAGNOSIS — D631 Anemia in chronic kidney disease: Secondary | ICD-10-CM | POA: Diagnosis not present

## 2015-07-25 DIAGNOSIS — L299 Pruritus, unspecified: Secondary | ICD-10-CM | POA: Diagnosis not present

## 2015-07-25 DIAGNOSIS — N186 End stage renal disease: Secondary | ICD-10-CM | POA: Diagnosis not present

## 2015-07-25 DIAGNOSIS — D509 Iron deficiency anemia, unspecified: Secondary | ICD-10-CM | POA: Diagnosis not present

## 2015-07-27 DIAGNOSIS — N186 End stage renal disease: Secondary | ICD-10-CM | POA: Diagnosis not present

## 2015-07-27 DIAGNOSIS — L299 Pruritus, unspecified: Secondary | ICD-10-CM | POA: Diagnosis not present

## 2015-07-27 DIAGNOSIS — D631 Anemia in chronic kidney disease: Secondary | ICD-10-CM | POA: Diagnosis not present

## 2015-07-27 DIAGNOSIS — D509 Iron deficiency anemia, unspecified: Secondary | ICD-10-CM | POA: Diagnosis not present

## 2015-07-27 DIAGNOSIS — N2581 Secondary hyperparathyroidism of renal origin: Secondary | ICD-10-CM | POA: Diagnosis not present

## 2015-07-30 DIAGNOSIS — D509 Iron deficiency anemia, unspecified: Secondary | ICD-10-CM | POA: Diagnosis not present

## 2015-07-30 DIAGNOSIS — N186 End stage renal disease: Secondary | ICD-10-CM | POA: Diagnosis not present

## 2015-07-30 DIAGNOSIS — N2581 Secondary hyperparathyroidism of renal origin: Secondary | ICD-10-CM | POA: Diagnosis not present

## 2015-07-30 DIAGNOSIS — D631 Anemia in chronic kidney disease: Secondary | ICD-10-CM | POA: Diagnosis not present

## 2015-07-30 DIAGNOSIS — L299 Pruritus, unspecified: Secondary | ICD-10-CM | POA: Diagnosis not present

## 2015-08-01 DIAGNOSIS — N2581 Secondary hyperparathyroidism of renal origin: Secondary | ICD-10-CM | POA: Diagnosis not present

## 2015-08-01 DIAGNOSIS — D509 Iron deficiency anemia, unspecified: Secondary | ICD-10-CM | POA: Diagnosis not present

## 2015-08-01 DIAGNOSIS — D631 Anemia in chronic kidney disease: Secondary | ICD-10-CM | POA: Diagnosis not present

## 2015-08-01 DIAGNOSIS — L299 Pruritus, unspecified: Secondary | ICD-10-CM | POA: Diagnosis not present

## 2015-08-01 DIAGNOSIS — N186 End stage renal disease: Secondary | ICD-10-CM | POA: Diagnosis not present

## 2015-08-02 DIAGNOSIS — N186 End stage renal disease: Secondary | ICD-10-CM | POA: Diagnosis not present

## 2015-08-02 DIAGNOSIS — Z992 Dependence on renal dialysis: Secondary | ICD-10-CM | POA: Diagnosis not present

## 2015-08-03 DIAGNOSIS — L299 Pruritus, unspecified: Secondary | ICD-10-CM | POA: Diagnosis not present

## 2015-08-03 DIAGNOSIS — D631 Anemia in chronic kidney disease: Secondary | ICD-10-CM | POA: Diagnosis not present

## 2015-08-03 DIAGNOSIS — D509 Iron deficiency anemia, unspecified: Secondary | ICD-10-CM | POA: Diagnosis not present

## 2015-08-03 DIAGNOSIS — N2581 Secondary hyperparathyroidism of renal origin: Secondary | ICD-10-CM | POA: Diagnosis not present

## 2015-08-03 DIAGNOSIS — N186 End stage renal disease: Secondary | ICD-10-CM | POA: Diagnosis not present

## 2015-08-05 DIAGNOSIS — D631 Anemia in chronic kidney disease: Secondary | ICD-10-CM | POA: Diagnosis not present

## 2015-08-05 DIAGNOSIS — D509 Iron deficiency anemia, unspecified: Secondary | ICD-10-CM | POA: Diagnosis not present

## 2015-08-05 DIAGNOSIS — L299 Pruritus, unspecified: Secondary | ICD-10-CM | POA: Diagnosis not present

## 2015-08-05 DIAGNOSIS — N2581 Secondary hyperparathyroidism of renal origin: Secondary | ICD-10-CM | POA: Diagnosis not present

## 2015-08-05 DIAGNOSIS — N186 End stage renal disease: Secondary | ICD-10-CM | POA: Diagnosis not present

## 2015-08-08 DIAGNOSIS — N186 End stage renal disease: Secondary | ICD-10-CM | POA: Diagnosis not present

## 2015-08-08 DIAGNOSIS — L299 Pruritus, unspecified: Secondary | ICD-10-CM | POA: Diagnosis not present

## 2015-08-08 DIAGNOSIS — D631 Anemia in chronic kidney disease: Secondary | ICD-10-CM | POA: Diagnosis not present

## 2015-08-08 DIAGNOSIS — N2581 Secondary hyperparathyroidism of renal origin: Secondary | ICD-10-CM | POA: Diagnosis not present

## 2015-08-08 DIAGNOSIS — D509 Iron deficiency anemia, unspecified: Secondary | ICD-10-CM | POA: Diagnosis not present

## 2015-08-10 DIAGNOSIS — N186 End stage renal disease: Secondary | ICD-10-CM | POA: Diagnosis not present

## 2015-08-10 DIAGNOSIS — D509 Iron deficiency anemia, unspecified: Secondary | ICD-10-CM | POA: Diagnosis not present

## 2015-08-10 DIAGNOSIS — N2581 Secondary hyperparathyroidism of renal origin: Secondary | ICD-10-CM | POA: Diagnosis not present

## 2015-08-10 DIAGNOSIS — L299 Pruritus, unspecified: Secondary | ICD-10-CM | POA: Diagnosis not present

## 2015-08-10 DIAGNOSIS — D631 Anemia in chronic kidney disease: Secondary | ICD-10-CM | POA: Diagnosis not present

## 2015-08-13 DIAGNOSIS — L299 Pruritus, unspecified: Secondary | ICD-10-CM | POA: Diagnosis not present

## 2015-08-13 DIAGNOSIS — D509 Iron deficiency anemia, unspecified: Secondary | ICD-10-CM | POA: Diagnosis not present

## 2015-08-13 DIAGNOSIS — N186 End stage renal disease: Secondary | ICD-10-CM | POA: Diagnosis not present

## 2015-08-13 DIAGNOSIS — D631 Anemia in chronic kidney disease: Secondary | ICD-10-CM | POA: Diagnosis not present

## 2015-08-13 DIAGNOSIS — N2581 Secondary hyperparathyroidism of renal origin: Secondary | ICD-10-CM | POA: Diagnosis not present

## 2015-08-15 DIAGNOSIS — N186 End stage renal disease: Secondary | ICD-10-CM | POA: Diagnosis not present

## 2015-08-15 DIAGNOSIS — L299 Pruritus, unspecified: Secondary | ICD-10-CM | POA: Diagnosis not present

## 2015-08-15 DIAGNOSIS — D509 Iron deficiency anemia, unspecified: Secondary | ICD-10-CM | POA: Diagnosis not present

## 2015-08-15 DIAGNOSIS — D631 Anemia in chronic kidney disease: Secondary | ICD-10-CM | POA: Diagnosis not present

## 2015-08-15 DIAGNOSIS — N2581 Secondary hyperparathyroidism of renal origin: Secondary | ICD-10-CM | POA: Diagnosis not present

## 2015-08-17 DIAGNOSIS — L299 Pruritus, unspecified: Secondary | ICD-10-CM | POA: Diagnosis not present

## 2015-08-17 DIAGNOSIS — N186 End stage renal disease: Secondary | ICD-10-CM | POA: Diagnosis not present

## 2015-08-17 DIAGNOSIS — D631 Anemia in chronic kidney disease: Secondary | ICD-10-CM | POA: Diagnosis not present

## 2015-08-17 DIAGNOSIS — N2581 Secondary hyperparathyroidism of renal origin: Secondary | ICD-10-CM | POA: Diagnosis not present

## 2015-08-17 DIAGNOSIS — D509 Iron deficiency anemia, unspecified: Secondary | ICD-10-CM | POA: Diagnosis not present

## 2015-08-19 DIAGNOSIS — Z0000 Encounter for general adult medical examination without abnormal findings: Secondary | ICD-10-CM | POA: Diagnosis not present

## 2015-08-20 DIAGNOSIS — D509 Iron deficiency anemia, unspecified: Secondary | ICD-10-CM | POA: Diagnosis not present

## 2015-08-20 DIAGNOSIS — D631 Anemia in chronic kidney disease: Secondary | ICD-10-CM | POA: Diagnosis not present

## 2015-08-20 DIAGNOSIS — N186 End stage renal disease: Secondary | ICD-10-CM | POA: Diagnosis not present

## 2015-08-20 DIAGNOSIS — N2581 Secondary hyperparathyroidism of renal origin: Secondary | ICD-10-CM | POA: Diagnosis not present

## 2015-08-20 DIAGNOSIS — L299 Pruritus, unspecified: Secondary | ICD-10-CM | POA: Diagnosis not present

## 2015-08-22 DIAGNOSIS — L299 Pruritus, unspecified: Secondary | ICD-10-CM | POA: Diagnosis not present

## 2015-08-22 DIAGNOSIS — N2581 Secondary hyperparathyroidism of renal origin: Secondary | ICD-10-CM | POA: Diagnosis not present

## 2015-08-22 DIAGNOSIS — N186 End stage renal disease: Secondary | ICD-10-CM | POA: Diagnosis not present

## 2015-08-22 DIAGNOSIS — D631 Anemia in chronic kidney disease: Secondary | ICD-10-CM | POA: Diagnosis not present

## 2015-08-22 DIAGNOSIS — D509 Iron deficiency anemia, unspecified: Secondary | ICD-10-CM | POA: Diagnosis not present

## 2015-08-24 DIAGNOSIS — D509 Iron deficiency anemia, unspecified: Secondary | ICD-10-CM | POA: Diagnosis not present

## 2015-08-24 DIAGNOSIS — N186 End stage renal disease: Secondary | ICD-10-CM | POA: Diagnosis not present

## 2015-08-24 DIAGNOSIS — N2581 Secondary hyperparathyroidism of renal origin: Secondary | ICD-10-CM | POA: Diagnosis not present

## 2015-08-24 DIAGNOSIS — L299 Pruritus, unspecified: Secondary | ICD-10-CM | POA: Diagnosis not present

## 2015-08-24 DIAGNOSIS — D631 Anemia in chronic kidney disease: Secondary | ICD-10-CM | POA: Diagnosis not present

## 2015-08-27 DIAGNOSIS — D509 Iron deficiency anemia, unspecified: Secondary | ICD-10-CM | POA: Diagnosis not present

## 2015-08-27 DIAGNOSIS — L299 Pruritus, unspecified: Secondary | ICD-10-CM | POA: Diagnosis not present

## 2015-08-27 DIAGNOSIS — N186 End stage renal disease: Secondary | ICD-10-CM | POA: Diagnosis not present

## 2015-08-27 DIAGNOSIS — N2581 Secondary hyperparathyroidism of renal origin: Secondary | ICD-10-CM | POA: Diagnosis not present

## 2015-08-27 DIAGNOSIS — D631 Anemia in chronic kidney disease: Secondary | ICD-10-CM | POA: Diagnosis not present

## 2015-08-29 DIAGNOSIS — D631 Anemia in chronic kidney disease: Secondary | ICD-10-CM | POA: Diagnosis not present

## 2015-08-29 DIAGNOSIS — N186 End stage renal disease: Secondary | ICD-10-CM | POA: Diagnosis not present

## 2015-08-29 DIAGNOSIS — N2581 Secondary hyperparathyroidism of renal origin: Secondary | ICD-10-CM | POA: Diagnosis not present

## 2015-08-29 DIAGNOSIS — L299 Pruritus, unspecified: Secondary | ICD-10-CM | POA: Diagnosis not present

## 2015-08-29 DIAGNOSIS — D509 Iron deficiency anemia, unspecified: Secondary | ICD-10-CM | POA: Diagnosis not present

## 2015-08-31 DIAGNOSIS — N186 End stage renal disease: Secondary | ICD-10-CM | POA: Diagnosis not present

## 2015-08-31 DIAGNOSIS — D631 Anemia in chronic kidney disease: Secondary | ICD-10-CM | POA: Diagnosis not present

## 2015-08-31 DIAGNOSIS — N2581 Secondary hyperparathyroidism of renal origin: Secondary | ICD-10-CM | POA: Diagnosis not present

## 2015-08-31 DIAGNOSIS — L299 Pruritus, unspecified: Secondary | ICD-10-CM | POA: Diagnosis not present

## 2015-08-31 DIAGNOSIS — D509 Iron deficiency anemia, unspecified: Secondary | ICD-10-CM | POA: Diagnosis not present

## 2015-09-03 DIAGNOSIS — D509 Iron deficiency anemia, unspecified: Secondary | ICD-10-CM | POA: Diagnosis not present

## 2015-09-03 DIAGNOSIS — D631 Anemia in chronic kidney disease: Secondary | ICD-10-CM | POA: Diagnosis not present

## 2015-09-03 DIAGNOSIS — N2581 Secondary hyperparathyroidism of renal origin: Secondary | ICD-10-CM | POA: Diagnosis not present

## 2015-09-03 DIAGNOSIS — L299 Pruritus, unspecified: Secondary | ICD-10-CM | POA: Diagnosis not present

## 2015-09-03 DIAGNOSIS — N186 End stage renal disease: Secondary | ICD-10-CM | POA: Diagnosis not present

## 2015-09-05 DIAGNOSIS — N2581 Secondary hyperparathyroidism of renal origin: Secondary | ICD-10-CM | POA: Diagnosis not present

## 2015-09-05 DIAGNOSIS — D631 Anemia in chronic kidney disease: Secondary | ICD-10-CM | POA: Diagnosis not present

## 2015-09-05 DIAGNOSIS — N186 End stage renal disease: Secondary | ICD-10-CM | POA: Diagnosis not present

## 2015-09-05 DIAGNOSIS — L299 Pruritus, unspecified: Secondary | ICD-10-CM | POA: Diagnosis not present

## 2015-09-05 DIAGNOSIS — D509 Iron deficiency anemia, unspecified: Secondary | ICD-10-CM | POA: Diagnosis not present

## 2015-09-07 DIAGNOSIS — D509 Iron deficiency anemia, unspecified: Secondary | ICD-10-CM | POA: Diagnosis not present

## 2015-09-07 DIAGNOSIS — N186 End stage renal disease: Secondary | ICD-10-CM | POA: Diagnosis not present

## 2015-09-07 DIAGNOSIS — D631 Anemia in chronic kidney disease: Secondary | ICD-10-CM | POA: Diagnosis not present

## 2015-09-07 DIAGNOSIS — L299 Pruritus, unspecified: Secondary | ICD-10-CM | POA: Diagnosis not present

## 2015-09-07 DIAGNOSIS — N2581 Secondary hyperparathyroidism of renal origin: Secondary | ICD-10-CM | POA: Diagnosis not present

## 2015-09-10 DIAGNOSIS — L299 Pruritus, unspecified: Secondary | ICD-10-CM | POA: Diagnosis not present

## 2015-09-10 DIAGNOSIS — D631 Anemia in chronic kidney disease: Secondary | ICD-10-CM | POA: Diagnosis not present

## 2015-09-10 DIAGNOSIS — N2581 Secondary hyperparathyroidism of renal origin: Secondary | ICD-10-CM | POA: Diagnosis not present

## 2015-09-10 DIAGNOSIS — N186 End stage renal disease: Secondary | ICD-10-CM | POA: Diagnosis not present

## 2015-09-10 DIAGNOSIS — D509 Iron deficiency anemia, unspecified: Secondary | ICD-10-CM | POA: Diagnosis not present

## 2015-09-12 DIAGNOSIS — D631 Anemia in chronic kidney disease: Secondary | ICD-10-CM | POA: Diagnosis not present

## 2015-09-12 DIAGNOSIS — N186 End stage renal disease: Secondary | ICD-10-CM | POA: Diagnosis not present

## 2015-09-12 DIAGNOSIS — L299 Pruritus, unspecified: Secondary | ICD-10-CM | POA: Diagnosis not present

## 2015-09-12 DIAGNOSIS — N2581 Secondary hyperparathyroidism of renal origin: Secondary | ICD-10-CM | POA: Diagnosis not present

## 2015-09-12 DIAGNOSIS — D509 Iron deficiency anemia, unspecified: Secondary | ICD-10-CM | POA: Diagnosis not present

## 2015-09-14 DIAGNOSIS — N186 End stage renal disease: Secondary | ICD-10-CM | POA: Diagnosis not present

## 2015-09-14 DIAGNOSIS — N2581 Secondary hyperparathyroidism of renal origin: Secondary | ICD-10-CM | POA: Diagnosis not present

## 2015-09-14 DIAGNOSIS — D509 Iron deficiency anemia, unspecified: Secondary | ICD-10-CM | POA: Diagnosis not present

## 2015-09-14 DIAGNOSIS — L299 Pruritus, unspecified: Secondary | ICD-10-CM | POA: Diagnosis not present

## 2015-09-14 DIAGNOSIS — D631 Anemia in chronic kidney disease: Secondary | ICD-10-CM | POA: Diagnosis not present

## 2015-09-17 DIAGNOSIS — D509 Iron deficiency anemia, unspecified: Secondary | ICD-10-CM | POA: Diagnosis not present

## 2015-09-17 DIAGNOSIS — L299 Pruritus, unspecified: Secondary | ICD-10-CM | POA: Diagnosis not present

## 2015-09-17 DIAGNOSIS — N2581 Secondary hyperparathyroidism of renal origin: Secondary | ICD-10-CM | POA: Diagnosis not present

## 2015-09-17 DIAGNOSIS — D631 Anemia in chronic kidney disease: Secondary | ICD-10-CM | POA: Diagnosis not present

## 2015-09-17 DIAGNOSIS — N186 End stage renal disease: Secondary | ICD-10-CM | POA: Diagnosis not present

## 2015-09-19 DIAGNOSIS — N186 End stage renal disease: Secondary | ICD-10-CM | POA: Diagnosis not present

## 2015-09-19 DIAGNOSIS — L299 Pruritus, unspecified: Secondary | ICD-10-CM | POA: Diagnosis not present

## 2015-09-19 DIAGNOSIS — N2581 Secondary hyperparathyroidism of renal origin: Secondary | ICD-10-CM | POA: Diagnosis not present

## 2015-09-19 DIAGNOSIS — D509 Iron deficiency anemia, unspecified: Secondary | ICD-10-CM | POA: Diagnosis not present

## 2015-09-19 DIAGNOSIS — D631 Anemia in chronic kidney disease: Secondary | ICD-10-CM | POA: Diagnosis not present

## 2015-09-21 DIAGNOSIS — N186 End stage renal disease: Secondary | ICD-10-CM | POA: Diagnosis not present

## 2015-09-21 DIAGNOSIS — D509 Iron deficiency anemia, unspecified: Secondary | ICD-10-CM | POA: Diagnosis not present

## 2015-09-21 DIAGNOSIS — D631 Anemia in chronic kidney disease: Secondary | ICD-10-CM | POA: Diagnosis not present

## 2015-09-21 DIAGNOSIS — N2581 Secondary hyperparathyroidism of renal origin: Secondary | ICD-10-CM | POA: Diagnosis not present

## 2015-09-21 DIAGNOSIS — L299 Pruritus, unspecified: Secondary | ICD-10-CM | POA: Diagnosis not present

## 2015-09-24 DIAGNOSIS — N2581 Secondary hyperparathyroidism of renal origin: Secondary | ICD-10-CM | POA: Diagnosis not present

## 2015-09-24 DIAGNOSIS — D509 Iron deficiency anemia, unspecified: Secondary | ICD-10-CM | POA: Diagnosis not present

## 2015-09-24 DIAGNOSIS — D631 Anemia in chronic kidney disease: Secondary | ICD-10-CM | POA: Diagnosis not present

## 2015-09-24 DIAGNOSIS — L299 Pruritus, unspecified: Secondary | ICD-10-CM | POA: Diagnosis not present

## 2015-09-24 DIAGNOSIS — N186 End stage renal disease: Secondary | ICD-10-CM | POA: Diagnosis not present

## 2015-09-26 DIAGNOSIS — N186 End stage renal disease: Secondary | ICD-10-CM | POA: Diagnosis not present

## 2015-09-26 DIAGNOSIS — L299 Pruritus, unspecified: Secondary | ICD-10-CM | POA: Diagnosis not present

## 2015-09-26 DIAGNOSIS — D631 Anemia in chronic kidney disease: Secondary | ICD-10-CM | POA: Diagnosis not present

## 2015-09-26 DIAGNOSIS — D509 Iron deficiency anemia, unspecified: Secondary | ICD-10-CM | POA: Diagnosis not present

## 2015-09-26 DIAGNOSIS — N2581 Secondary hyperparathyroidism of renal origin: Secondary | ICD-10-CM | POA: Diagnosis not present

## 2015-09-28 DIAGNOSIS — N2581 Secondary hyperparathyroidism of renal origin: Secondary | ICD-10-CM | POA: Diagnosis not present

## 2015-09-28 DIAGNOSIS — D631 Anemia in chronic kidney disease: Secondary | ICD-10-CM | POA: Diagnosis not present

## 2015-09-28 DIAGNOSIS — D509 Iron deficiency anemia, unspecified: Secondary | ICD-10-CM | POA: Diagnosis not present

## 2015-09-28 DIAGNOSIS — N186 End stage renal disease: Secondary | ICD-10-CM | POA: Diagnosis not present

## 2015-09-28 DIAGNOSIS — L299 Pruritus, unspecified: Secondary | ICD-10-CM | POA: Diagnosis not present

## 2015-10-01 DIAGNOSIS — D631 Anemia in chronic kidney disease: Secondary | ICD-10-CM | POA: Diagnosis not present

## 2015-10-01 DIAGNOSIS — D509 Iron deficiency anemia, unspecified: Secondary | ICD-10-CM | POA: Diagnosis not present

## 2015-10-01 DIAGNOSIS — L299 Pruritus, unspecified: Secondary | ICD-10-CM | POA: Diagnosis not present

## 2015-10-01 DIAGNOSIS — N186 End stage renal disease: Secondary | ICD-10-CM | POA: Diagnosis not present

## 2015-10-01 DIAGNOSIS — N2581 Secondary hyperparathyroidism of renal origin: Secondary | ICD-10-CM | POA: Diagnosis not present

## 2015-10-03 DIAGNOSIS — D631 Anemia in chronic kidney disease: Secondary | ICD-10-CM | POA: Diagnosis not present

## 2015-10-03 DIAGNOSIS — Z992 Dependence on renal dialysis: Secondary | ICD-10-CM | POA: Diagnosis not present

## 2015-10-03 DIAGNOSIS — L299 Pruritus, unspecified: Secondary | ICD-10-CM | POA: Diagnosis not present

## 2015-10-03 DIAGNOSIS — N2581 Secondary hyperparathyroidism of renal origin: Secondary | ICD-10-CM | POA: Diagnosis not present

## 2015-10-03 DIAGNOSIS — D509 Iron deficiency anemia, unspecified: Secondary | ICD-10-CM | POA: Diagnosis not present

## 2015-10-03 DIAGNOSIS — N186 End stage renal disease: Secondary | ICD-10-CM | POA: Diagnosis not present

## 2015-10-05 DIAGNOSIS — D509 Iron deficiency anemia, unspecified: Secondary | ICD-10-CM | POA: Diagnosis not present

## 2015-10-05 DIAGNOSIS — N2581 Secondary hyperparathyroidism of renal origin: Secondary | ICD-10-CM | POA: Diagnosis not present

## 2015-10-05 DIAGNOSIS — D631 Anemia in chronic kidney disease: Secondary | ICD-10-CM | POA: Diagnosis not present

## 2015-10-05 DIAGNOSIS — N186 End stage renal disease: Secondary | ICD-10-CM | POA: Diagnosis not present

## 2015-10-05 DIAGNOSIS — L299 Pruritus, unspecified: Secondary | ICD-10-CM | POA: Diagnosis not present

## 2015-10-08 DIAGNOSIS — N186 End stage renal disease: Secondary | ICD-10-CM | POA: Diagnosis not present

## 2015-10-08 DIAGNOSIS — D631 Anemia in chronic kidney disease: Secondary | ICD-10-CM | POA: Diagnosis not present

## 2015-10-08 DIAGNOSIS — L299 Pruritus, unspecified: Secondary | ICD-10-CM | POA: Diagnosis not present

## 2015-10-08 DIAGNOSIS — D509 Iron deficiency anemia, unspecified: Secondary | ICD-10-CM | POA: Diagnosis not present

## 2015-10-08 DIAGNOSIS — N2581 Secondary hyperparathyroidism of renal origin: Secondary | ICD-10-CM | POA: Diagnosis not present

## 2015-10-10 ENCOUNTER — Encounter: Payer: Self-pay | Admitting: *Deleted

## 2015-10-10 ENCOUNTER — Ambulatory Visit
Admission: EM | Admit: 2015-10-10 | Discharge: 2015-10-10 | Disposition: A | Discharge status: Home / Self Care | Payer: Medicare Other | Attending: Family Medicine | Admitting: Family Medicine

## 2015-10-10 ENCOUNTER — Ambulatory Visit: Payer: Medicare Other

## 2015-10-10 DIAGNOSIS — D631 Anemia in chronic kidney disease: Secondary | ICD-10-CM | POA: Diagnosis not present

## 2015-10-10 DIAGNOSIS — Z888 Allergy status to other drugs, medicaments and biological substances status: Secondary | ICD-10-CM | POA: Diagnosis not present

## 2015-10-10 DIAGNOSIS — D509 Iron deficiency anemia, unspecified: Secondary | ICD-10-CM | POA: Diagnosis not present

## 2015-10-10 DIAGNOSIS — W010XXA Fall on same level from slipping, tripping and stumbling without subsequent striking against object, initial encounter: Secondary | ICD-10-CM | POA: Insufficient documentation

## 2015-10-10 DIAGNOSIS — Z992 Dependence on renal dialysis: Secondary | ICD-10-CM | POA: Insufficient documentation

## 2015-10-10 DIAGNOSIS — Z87891 Personal history of nicotine dependence: Secondary | ICD-10-CM | POA: Insufficient documentation

## 2015-10-10 DIAGNOSIS — Z91010 Allergy to peanuts: Secondary | ICD-10-CM | POA: Insufficient documentation

## 2015-10-10 DIAGNOSIS — M25522 Pain in left elbow: Secondary | ICD-10-CM | POA: Diagnosis not present

## 2015-10-10 DIAGNOSIS — I132 Hypertensive heart and chronic kidney disease with heart failure and with stage 5 chronic kidney disease, or end stage renal disease: Secondary | ICD-10-CM | POA: Diagnosis not present

## 2015-10-10 DIAGNOSIS — Z880 Allergy status to penicillin: Secondary | ICD-10-CM | POA: Diagnosis not present

## 2015-10-10 DIAGNOSIS — I509 Heart failure, unspecified: Secondary | ICD-10-CM | POA: Diagnosis not present

## 2015-10-10 DIAGNOSIS — S59902A Unspecified injury of left elbow, initial encounter: Secondary | ICD-10-CM | POA: Diagnosis not present

## 2015-10-10 DIAGNOSIS — S5002XA Contusion of left elbow, initial encounter: Secondary | ICD-10-CM | POA: Diagnosis not present

## 2015-10-10 DIAGNOSIS — N186 End stage renal disease: Secondary | ICD-10-CM | POA: Insufficient documentation

## 2015-10-10 DIAGNOSIS — Z882 Allergy status to sulfonamides status: Secondary | ICD-10-CM | POA: Diagnosis not present

## 2015-10-10 DIAGNOSIS — J449 Chronic obstructive pulmonary disease, unspecified: Secondary | ICD-10-CM | POA: Diagnosis not present

## 2015-10-10 DIAGNOSIS — R220 Localized swelling, mass and lump, head: Secondary | ICD-10-CM | POA: Diagnosis not present

## 2015-10-10 DIAGNOSIS — L299 Pruritus, unspecified: Secondary | ICD-10-CM | POA: Diagnosis not present

## 2015-10-10 DIAGNOSIS — N2581 Secondary hyperparathyroidism of renal origin: Secondary | ICD-10-CM | POA: Diagnosis not present

## 2015-10-10 NOTE — ED Provider Notes (Signed)
[facility] ____________________________________________  Time seen: Approximately 11:53 AM  I have reviewed the triage vital signs and the nursing notes.   HISTORY  Chief Complaint Joint Swelling  HPI Paulita FujitaCamille Johnson Cleavenger is a 50 y.o. female presents for complaints of left elbow pain for 2.5 weeks. Patient reports that 2.5 weeks ago she was in her kitchen at the counter, and turned around and tripped. States her fiance left a [place] cooking pot on the floor, and when she turned around she tripped over the pot, "like a ballerina" and fell on her left elbow. States she tried to catch herself with her arms but landed directly on her left elbow. Patient reports pain and swelling to left elbow since. States she quickly had some bruising and swelling that became present within a day, but reports swelling increased at back of elbow over there first few days that has not resolved. Reports pain to elbow since fall. Denies fevers. Denies redness or break in skin.   Patient states that left elbow pain is mild. Patient reports she still has full range of motion. Patient states that overall the pain has somewhat improved. Patient reports swelling has persisted. Denies any pain radiation. Denies any numbness or tingling sensation. Denies any swelling or pain to upper arms or lower arm. Denies any left hand issues, tingling sensation or decrease range of motion.  Denies head injury or loss of consciousness. Denies any other pain or injury.  Denies neck pain, back pain, neck or back injury, other extremity pain or injury, chest pain, short is a breath, abdominal pain, dysuria. Patient reports she fell only because she tripped over the pot on the floor. Patient reports she is a dialysis patient, with a AV fistula access to her right forearm. Patient reports that her dialysis is Tuesday Thursday and Saturday. Reports complete a dialysis this morning. Denies any complications with dialysis. Denies any  missing dialysis appointment recently. Denies any other complaints. Reports has continued to range active. Reports has continued to work. 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 Denies any history of blood clots or abnormal coagulation.  Maida SaleMURRAY,JANE, MD PCP  Patient's last menstrual period was 09/17/2015 (approximate). Denies concerns of pregnancy.   Past Medical History:  Diagnosis Date  . COPD (chronic obstructive pulmonary disease) (HCC)   . Cryptogenic organizing pneumonia (HCC)   . End stage renal disease (HCC)   . Essential hypertension   . Restrictive lung disease     Patient Active Problem List   Diagnosis Date Noted  . COPD exacerbation (HCC) 02/13/2015  . Acute respiratory failure (HCC) 02/13/2015  . Pneumonia 02/13/2015  . CHF (congestive heart failure) (HCC) 02/12/2015    Past Surgical History:  Procedure Laterality Date  . AV fistula right lower arm Right      No current facility-administered medications for this encounter.   Current Outpatient Prescriptions:  .  calcium carbonate (TUMS - DOSED IN MG ELEMENTAL CALCIUM) 500 MG chewable tablet, Chew 1 tablet by mouth 3 (three) times daily., Disp: , Rfl:  .  carvedilol (COREG) 25 MG tablet, Take 25 mg by mouth 2 (two) times daily., Disp: , Rfl:  .  cinacalcet (SENSIPAR) 30 MG tablet, Take 90 mg by mouth daily., Disp: , Rfl:  .  NIFEdipine (PROCARDIA XL/ADALAT-CC) 90 MG 24 hr tablet, Take 90 mg by mouth daily., Disp: , Rfl:  .  predniSONE (DELTASONE) 10 MG tablet, Take 10 mg by mouth daily.,  Disp: , Rfl:  .  predniSONE (DELTASONE) 10 MG tablet, Take 1 tablet (10 mg total) by mouth daily with breakfast. 40 mg PO (by mouth) x 1 days 30 mg PO x 2 days 20 mg PO x 2 days the resume  10 mg PO daily, Disp: 20 tablet, Rfl: 0 .  albuterol (PROVENTIL HFA;VENTOLIN HFA) 108 (90 Base) MCG/ACT inhaler, Inhale 2 puffs into the lungs every 6 (six) hours as needed for wheezing or shortness of  breath., Disp: , Rfl:  .  tiotropium (SPIRIVA) 18 MCG inhalation capsule, Place 1 capsule (18 mcg total) into inhaler and inhale daily., Disp: 30 capsule, Rfl: 12  Allergies Iron; Peanuts [peanut oil]; Penicillins; Potassium-containing compounds; Septra [sulfamethoxazole-trimethoprim]; Skelaxin [metaxalone]; Erythromycin; and Iodine  Family History  Problem Relation Age of Onset  . Diabetes Sister     Social History Social History  Substance Use Topics  . Smoking status: Former Smoker    Types: Cigarettes  . Smokeless tobacco: Never Used  . Alcohol use Yes     Comment: occas.     Review of Systems Constitutional: No fever/chills Eyes: No visual changes. ENT: No sore throat. Cardiovascular: Denies chest pain. Respiratory: Denies shortness of breath. Gastrointestinal: No abdominal pain.  No nausea, no vomiting.  No diarrhea.  No constipation. Genitourinary: Negative for dysuria. Musculoskeletal: Negative for back pain. As above.  Skin: Negative for rash. Neurological: Negative for headaches, focal weakness or numbness.  10-point ROS otherwise negative.  ____________________________________________   PHYSICAL EXAM:  VITAL SIGNS: ED Triage Vitals  Enc Vitals Group     BP 10/10/15 1107 (!) 144/82     Pulse Rate 10/10/15 1107 77     Resp 10/10/15 1107 16     Temp 10/10/15 1107 98.6 F (37 C)     Temp Source 10/10/15 1107 Oral     SpO2 10/10/15 1107 95 %     Weight 10/10/15 1109 151 lb (68.5 kg)     Height 10/10/15 1109 5\' 1"  (1.549 m)     Head Circumference --      Peak Flow --      Pain Score --      Pain Loc --      Pain Edu? --      Excl. in GC? --     Constitutional: Alert and oriented. Well appearing and in no acute distress. Eyes: Conjunctivae are normal. PERRL. EOMI. ENT      Head: Normocephalic and atraumatic.      Mouth/Throat: Mucous membranes are moist. Cardiovascular: Normal rate, regular rhythm. Grossly normal heart sounds.  Good peripheral  circulation. Respiratory: Normal respiratory effort without tachypnea nor retractions. Breath sounds are clear and equal bilaterally. No wheezes/rales/rhonchi.. Gastrointestinal: Soft and nontender.  Musculoskeletal:  Nontender with normal range of motion in all extremities. No midline cervical, thoracic or lumbar tenderness to palpation. Right forearm AV fistula access, nontender, thrill palpable.  Except: Left elbow with lateral and posterior mild to moderate point bony tenderness on the lateral epicondyles and olecranon with soft posterior elbow mobile swelling localized to posterior only noncirumferential and mild tenderness to palpation, swelling is soft, no erythema, mild ecchymosis to posterior elbow, full range of motion, no pain with pronation or supination, mild pain to posterior elbow with elbow flexion and extension, no swelling or pain to humeral area or forearm, left hand full range of motion. Bilateral hand grips strong and equal. Bilateral distal radial pulses strong and equal. Left arm no motor or tendon deficits. Left  hand with normal distal capillary refill with less than 2 seconds to all left distal fingers. No drainage, skin intact. Neurologic:  Normal speech and language. No gross focal neurologic deficits are appreciated. Speech is normal. No gait instability.  Skin:  Skin is warm, dry and intact. No rash noted. Psychiatric: Mood and affect are normal. Speech and behavior are normal. Patient exhibits appropriate insight and judgment   ___________________________________________   LABS (all labs ordered are listed, but only abnormal results are displayed)  Labs Reviewed - No data to display  RADIOLOGY  Dg Elbow Complete Left  Result Date: 10/10/2015 CLINICAL DATA:  Fall. EXAM: LEFT ELBOW - COMPLETE 3+ VIEW COMPARISON:  No recent prior. FINDINGS: Severe soft tissue swelling is noted over the elbow and forearm. Hematoma cannot be excluded. Tiny bony density is noted adjacent to  the radial epicondyle. This could be a non fused secondary ossification center. This could be a tiny fracture fragment. Tiny bony density noted adjacent to the ulnar epicondyle may represent a tiny fracture fragment. No prominent fracture is noted. Radial head is intact. Vascular calcification noted . IMPRESSION: 1. Severe soft tissue swelling over the elbow or forearm. Hematoma cannot be excluded. 2. Tiny bony density noted adjacent to the radial epicondyle PA this could be a non fused secondary ossification center or tiny fracture fragment. Tiny bony density noted adjacent to the ulnar epicondyle may represent a tiny fracture fragment, possibly old. 3. Vascular calcification . Electronically Signed   By: Maisie Fus  Register   On: 10/10/2015 12:45   ____________________________________________   PROCEDURES Procedures   Sling applied by RN.  INITIAL IMPRESSION / ASSESSMENT AND PLAN / ED COURSE  Pertinent labs & imaging results that were available during my care of the patient were reviewed by me and considered in my medical decision making (see chart for details).  Very well-appearing patient. No acute distress. Presents for the complaints of left posterior elbow pain and swelling post mechanical injury 2.5 weeks ago. Reports she tripped and fell directly on left elbow. Denies any other pain or injury. Left elbow with lateral and posterior point bony tenderness on the lateral epicondyles and olecranon with soft posterior mobile swelling that is soft and non-circumferential, no erythema, no drainage, skin intact. Will evaluate by x-ray to evaluate bony tenderness, suspect bursitis hematoma swelling.  Per radiologist, left elbow soft tissue swelling over the elbow or forearm, hematoma cannot be excluded; tiny bony density noted adjacent to the radial epicondyles this could be a nonfused secondary ossification center or tiny fracture fragment, tiny bony density noted adjacent to the ulnar epicondyles  which may represent a tiny fracture fragment possibly old; vascular calcification. Discussed in detail with patient and fiance regarding xray results. Discussed in detail, patient declined previous injury to elbow, and discussed as patient is point tender in areas of possible bony injury on xray, concern for acute fracture. As injury occurred over 2 weeks ago and has not been immobilized, will place in sling. Counseled regarding stretching and range of motion exercises.   Discussed swelling and evaluation by ultrasound to exclude venous thrombosis, patient declines ultrasound and states she wants to go home, and verbalizes understanding of risks. States will follow up closely. Information for orthopedics given, follow up this week. Denies need for pain medication. Work note for today given.   Discussed follow up with Primary care physician this week. Discussed follow up and return parameters including no resolution or any worsening concerns. Patient verbalized understanding and agreed to  plan.   ____________________________________________   FINAL CLINICAL IMPRESSION(S) / ED DIAGNOSES  Final diagnoses:  Left elbow pain  Left elbow contusion, initial encounter     Discharge Medication List as of 10/10/2015  1:09 PM      Note: This dictation was prepared with Dragon dictation along with smaller phrase technology. Any transcriptional errors that result from this process are unintentional.    Clinical Course      Renford Dills, NP 10/10/15 1517

## 2015-10-10 NOTE — ED Triage Notes (Signed)
Pt tripped and fell at home 3 weeks ago, landed on left arm. Now c/o left elbow pain and edema.

## 2015-10-10 NOTE — Discharge Instructions (Signed)
Wear sling to support and rest. Stretch daily as discussed.   Follow up with orthopedic this week, see above to call to schedule.   Follow up with your primary care physician this week as needed. Return to Urgent care or ER for increased pain, swelling, decreased range of motion, new or worsening concerns.

## 2015-10-10 NOTE — ED Triage Notes (Signed)
Pt is dialysis pt with av fistula in right forearm. +thrill/bruit. Pt has dialysis on T/Th/Sat. Just completed dialysis this am with no complications.

## 2015-10-12 DIAGNOSIS — D509 Iron deficiency anemia, unspecified: Secondary | ICD-10-CM | POA: Diagnosis not present

## 2015-10-12 DIAGNOSIS — D631 Anemia in chronic kidney disease: Secondary | ICD-10-CM | POA: Diagnosis not present

## 2015-10-12 DIAGNOSIS — N186 End stage renal disease: Secondary | ICD-10-CM | POA: Diagnosis not present

## 2015-10-12 DIAGNOSIS — L299 Pruritus, unspecified: Secondary | ICD-10-CM | POA: Diagnosis not present

## 2015-10-12 DIAGNOSIS — N2581 Secondary hyperparathyroidism of renal origin: Secondary | ICD-10-CM | POA: Diagnosis not present

## 2015-10-15 DIAGNOSIS — D631 Anemia in chronic kidney disease: Secondary | ICD-10-CM | POA: Diagnosis not present

## 2015-10-15 DIAGNOSIS — D509 Iron deficiency anemia, unspecified: Secondary | ICD-10-CM | POA: Diagnosis not present

## 2015-10-15 DIAGNOSIS — N2581 Secondary hyperparathyroidism of renal origin: Secondary | ICD-10-CM | POA: Diagnosis not present

## 2015-10-15 DIAGNOSIS — L299 Pruritus, unspecified: Secondary | ICD-10-CM | POA: Diagnosis not present

## 2015-10-15 DIAGNOSIS — N186 End stage renal disease: Secondary | ICD-10-CM | POA: Diagnosis not present

## 2015-10-17 DIAGNOSIS — N2581 Secondary hyperparathyroidism of renal origin: Secondary | ICD-10-CM | POA: Diagnosis not present

## 2015-10-17 DIAGNOSIS — L299 Pruritus, unspecified: Secondary | ICD-10-CM | POA: Diagnosis not present

## 2015-10-17 DIAGNOSIS — N186 End stage renal disease: Secondary | ICD-10-CM | POA: Diagnosis not present

## 2015-10-17 DIAGNOSIS — D631 Anemia in chronic kidney disease: Secondary | ICD-10-CM | POA: Diagnosis not present

## 2015-10-17 DIAGNOSIS — D509 Iron deficiency anemia, unspecified: Secondary | ICD-10-CM | POA: Diagnosis not present

## 2015-10-19 DIAGNOSIS — N186 End stage renal disease: Secondary | ICD-10-CM | POA: Diagnosis not present

## 2015-10-19 DIAGNOSIS — D631 Anemia in chronic kidney disease: Secondary | ICD-10-CM | POA: Diagnosis not present

## 2015-10-19 DIAGNOSIS — D509 Iron deficiency anemia, unspecified: Secondary | ICD-10-CM | POA: Diagnosis not present

## 2015-10-19 DIAGNOSIS — L299 Pruritus, unspecified: Secondary | ICD-10-CM | POA: Diagnosis not present

## 2015-10-19 DIAGNOSIS — N2581 Secondary hyperparathyroidism of renal origin: Secondary | ICD-10-CM | POA: Diagnosis not present

## 2015-10-22 DIAGNOSIS — N186 End stage renal disease: Secondary | ICD-10-CM | POA: Diagnosis not present

## 2015-10-22 DIAGNOSIS — D631 Anemia in chronic kidney disease: Secondary | ICD-10-CM | POA: Diagnosis not present

## 2015-10-22 DIAGNOSIS — L299 Pruritus, unspecified: Secondary | ICD-10-CM | POA: Diagnosis not present

## 2015-10-22 DIAGNOSIS — N2581 Secondary hyperparathyroidism of renal origin: Secondary | ICD-10-CM | POA: Diagnosis not present

## 2015-10-22 DIAGNOSIS — D509 Iron deficiency anemia, unspecified: Secondary | ICD-10-CM | POA: Diagnosis not present

## 2015-10-24 DIAGNOSIS — N186 End stage renal disease: Secondary | ICD-10-CM | POA: Diagnosis not present

## 2015-10-24 DIAGNOSIS — L299 Pruritus, unspecified: Secondary | ICD-10-CM | POA: Diagnosis not present

## 2015-10-24 DIAGNOSIS — N2581 Secondary hyperparathyroidism of renal origin: Secondary | ICD-10-CM | POA: Diagnosis not present

## 2015-10-24 DIAGNOSIS — D509 Iron deficiency anemia, unspecified: Secondary | ICD-10-CM | POA: Diagnosis not present

## 2015-10-24 DIAGNOSIS — D631 Anemia in chronic kidney disease: Secondary | ICD-10-CM | POA: Diagnosis not present

## 2015-10-26 DIAGNOSIS — D631 Anemia in chronic kidney disease: Secondary | ICD-10-CM | POA: Diagnosis not present

## 2015-10-26 DIAGNOSIS — N186 End stage renal disease: Secondary | ICD-10-CM | POA: Diagnosis not present

## 2015-10-26 DIAGNOSIS — L299 Pruritus, unspecified: Secondary | ICD-10-CM | POA: Diagnosis not present

## 2015-10-26 DIAGNOSIS — D509 Iron deficiency anemia, unspecified: Secondary | ICD-10-CM | POA: Diagnosis not present

## 2015-10-26 DIAGNOSIS — N2581 Secondary hyperparathyroidism of renal origin: Secondary | ICD-10-CM | POA: Diagnosis not present

## 2015-10-29 DIAGNOSIS — D509 Iron deficiency anemia, unspecified: Secondary | ICD-10-CM | POA: Diagnosis not present

## 2015-10-29 DIAGNOSIS — N186 End stage renal disease: Secondary | ICD-10-CM | POA: Diagnosis not present

## 2015-10-29 DIAGNOSIS — N2581 Secondary hyperparathyroidism of renal origin: Secondary | ICD-10-CM | POA: Diagnosis not present

## 2015-10-29 DIAGNOSIS — L299 Pruritus, unspecified: Secondary | ICD-10-CM | POA: Diagnosis not present

## 2015-10-29 DIAGNOSIS — D631 Anemia in chronic kidney disease: Secondary | ICD-10-CM | POA: Diagnosis not present

## 2015-10-31 DIAGNOSIS — N186 End stage renal disease: Secondary | ICD-10-CM | POA: Diagnosis not present

## 2015-10-31 DIAGNOSIS — D509 Iron deficiency anemia, unspecified: Secondary | ICD-10-CM | POA: Diagnosis not present

## 2015-10-31 DIAGNOSIS — N2581 Secondary hyperparathyroidism of renal origin: Secondary | ICD-10-CM | POA: Diagnosis not present

## 2015-10-31 DIAGNOSIS — D631 Anemia in chronic kidney disease: Secondary | ICD-10-CM | POA: Diagnosis not present

## 2015-10-31 DIAGNOSIS — L299 Pruritus, unspecified: Secondary | ICD-10-CM | POA: Diagnosis not present

## 2015-11-02 DIAGNOSIS — N186 End stage renal disease: Secondary | ICD-10-CM | POA: Diagnosis not present

## 2015-11-02 DIAGNOSIS — D631 Anemia in chronic kidney disease: Secondary | ICD-10-CM | POA: Diagnosis not present

## 2015-11-02 DIAGNOSIS — Z992 Dependence on renal dialysis: Secondary | ICD-10-CM | POA: Diagnosis not present

## 2015-11-02 DIAGNOSIS — N2581 Secondary hyperparathyroidism of renal origin: Secondary | ICD-10-CM | POA: Diagnosis not present

## 2015-11-02 DIAGNOSIS — D509 Iron deficiency anemia, unspecified: Secondary | ICD-10-CM | POA: Diagnosis not present

## 2015-11-02 DIAGNOSIS — L299 Pruritus, unspecified: Secondary | ICD-10-CM | POA: Diagnosis not present

## 2015-11-05 DIAGNOSIS — D509 Iron deficiency anemia, unspecified: Secondary | ICD-10-CM | POA: Diagnosis not present

## 2015-11-05 DIAGNOSIS — D631 Anemia in chronic kidney disease: Secondary | ICD-10-CM | POA: Diagnosis not present

## 2015-11-05 DIAGNOSIS — N2581 Secondary hyperparathyroidism of renal origin: Secondary | ICD-10-CM | POA: Diagnosis not present

## 2015-11-05 DIAGNOSIS — N186 End stage renal disease: Secondary | ICD-10-CM | POA: Diagnosis not present

## 2015-11-05 DIAGNOSIS — L299 Pruritus, unspecified: Secondary | ICD-10-CM | POA: Diagnosis not present

## 2015-11-07 DIAGNOSIS — N2581 Secondary hyperparathyroidism of renal origin: Secondary | ICD-10-CM | POA: Diagnosis not present

## 2015-11-07 DIAGNOSIS — L299 Pruritus, unspecified: Secondary | ICD-10-CM | POA: Diagnosis not present

## 2015-11-07 DIAGNOSIS — D509 Iron deficiency anemia, unspecified: Secondary | ICD-10-CM | POA: Diagnosis not present

## 2015-11-07 DIAGNOSIS — N186 End stage renal disease: Secondary | ICD-10-CM | POA: Diagnosis not present

## 2015-11-07 DIAGNOSIS — D631 Anemia in chronic kidney disease: Secondary | ICD-10-CM | POA: Diagnosis not present

## 2015-11-09 DIAGNOSIS — D631 Anemia in chronic kidney disease: Secondary | ICD-10-CM | POA: Diagnosis not present

## 2015-11-09 DIAGNOSIS — N186 End stage renal disease: Secondary | ICD-10-CM | POA: Diagnosis not present

## 2015-11-09 DIAGNOSIS — L299 Pruritus, unspecified: Secondary | ICD-10-CM | POA: Diagnosis not present

## 2015-11-09 DIAGNOSIS — D509 Iron deficiency anemia, unspecified: Secondary | ICD-10-CM | POA: Diagnosis not present

## 2015-11-09 DIAGNOSIS — N2581 Secondary hyperparathyroidism of renal origin: Secondary | ICD-10-CM | POA: Diagnosis not present

## 2015-11-12 DIAGNOSIS — D631 Anemia in chronic kidney disease: Secondary | ICD-10-CM | POA: Diagnosis not present

## 2015-11-12 DIAGNOSIS — N2581 Secondary hyperparathyroidism of renal origin: Secondary | ICD-10-CM | POA: Diagnosis not present

## 2015-11-12 DIAGNOSIS — L299 Pruritus, unspecified: Secondary | ICD-10-CM | POA: Diagnosis not present

## 2015-11-12 DIAGNOSIS — N186 End stage renal disease: Secondary | ICD-10-CM | POA: Diagnosis not present

## 2015-11-12 DIAGNOSIS — D509 Iron deficiency anemia, unspecified: Secondary | ICD-10-CM | POA: Diagnosis not present

## 2015-11-14 DIAGNOSIS — L299 Pruritus, unspecified: Secondary | ICD-10-CM | POA: Diagnosis not present

## 2015-11-14 DIAGNOSIS — N186 End stage renal disease: Secondary | ICD-10-CM | POA: Diagnosis not present

## 2015-11-14 DIAGNOSIS — D509 Iron deficiency anemia, unspecified: Secondary | ICD-10-CM | POA: Diagnosis not present

## 2015-11-14 DIAGNOSIS — D631 Anemia in chronic kidney disease: Secondary | ICD-10-CM | POA: Diagnosis not present

## 2015-11-14 DIAGNOSIS — N2581 Secondary hyperparathyroidism of renal origin: Secondary | ICD-10-CM | POA: Diagnosis not present

## 2015-11-16 DIAGNOSIS — D631 Anemia in chronic kidney disease: Secondary | ICD-10-CM | POA: Diagnosis not present

## 2015-11-16 DIAGNOSIS — L299 Pruritus, unspecified: Secondary | ICD-10-CM | POA: Diagnosis not present

## 2015-11-16 DIAGNOSIS — N2581 Secondary hyperparathyroidism of renal origin: Secondary | ICD-10-CM | POA: Diagnosis not present

## 2015-11-16 DIAGNOSIS — D509 Iron deficiency anemia, unspecified: Secondary | ICD-10-CM | POA: Diagnosis not present

## 2015-11-16 DIAGNOSIS — N186 End stage renal disease: Secondary | ICD-10-CM | POA: Diagnosis not present

## 2015-11-19 DIAGNOSIS — N2581 Secondary hyperparathyroidism of renal origin: Secondary | ICD-10-CM | POA: Diagnosis not present

## 2015-11-19 DIAGNOSIS — D509 Iron deficiency anemia, unspecified: Secondary | ICD-10-CM | POA: Diagnosis not present

## 2015-11-19 DIAGNOSIS — N186 End stage renal disease: Secondary | ICD-10-CM | POA: Diagnosis not present

## 2015-11-19 DIAGNOSIS — D631 Anemia in chronic kidney disease: Secondary | ICD-10-CM | POA: Diagnosis not present

## 2015-11-19 DIAGNOSIS — L299 Pruritus, unspecified: Secondary | ICD-10-CM | POA: Diagnosis not present

## 2015-11-21 DIAGNOSIS — N186 End stage renal disease: Secondary | ICD-10-CM | POA: Diagnosis not present

## 2015-11-21 DIAGNOSIS — L299 Pruritus, unspecified: Secondary | ICD-10-CM | POA: Diagnosis not present

## 2015-11-21 DIAGNOSIS — D631 Anemia in chronic kidney disease: Secondary | ICD-10-CM | POA: Diagnosis not present

## 2015-11-21 DIAGNOSIS — D509 Iron deficiency anemia, unspecified: Secondary | ICD-10-CM | POA: Diagnosis not present

## 2015-11-21 DIAGNOSIS — N2581 Secondary hyperparathyroidism of renal origin: Secondary | ICD-10-CM | POA: Diagnosis not present

## 2015-11-23 DIAGNOSIS — N186 End stage renal disease: Secondary | ICD-10-CM | POA: Diagnosis not present

## 2015-11-23 DIAGNOSIS — D631 Anemia in chronic kidney disease: Secondary | ICD-10-CM | POA: Diagnosis not present

## 2015-11-23 DIAGNOSIS — D509 Iron deficiency anemia, unspecified: Secondary | ICD-10-CM | POA: Diagnosis not present

## 2015-11-23 DIAGNOSIS — N2581 Secondary hyperparathyroidism of renal origin: Secondary | ICD-10-CM | POA: Diagnosis not present

## 2015-11-23 DIAGNOSIS — L299 Pruritus, unspecified: Secondary | ICD-10-CM | POA: Diagnosis not present

## 2015-11-26 DIAGNOSIS — N186 End stage renal disease: Secondary | ICD-10-CM | POA: Diagnosis not present

## 2015-11-26 DIAGNOSIS — D509 Iron deficiency anemia, unspecified: Secondary | ICD-10-CM | POA: Diagnosis not present

## 2015-11-26 DIAGNOSIS — L299 Pruritus, unspecified: Secondary | ICD-10-CM | POA: Diagnosis not present

## 2015-11-26 DIAGNOSIS — N2581 Secondary hyperparathyroidism of renal origin: Secondary | ICD-10-CM | POA: Diagnosis not present

## 2015-11-26 DIAGNOSIS — D631 Anemia in chronic kidney disease: Secondary | ICD-10-CM | POA: Diagnosis not present

## 2015-11-28 DIAGNOSIS — D509 Iron deficiency anemia, unspecified: Secondary | ICD-10-CM | POA: Diagnosis not present

## 2015-11-28 DIAGNOSIS — D631 Anemia in chronic kidney disease: Secondary | ICD-10-CM | POA: Diagnosis not present

## 2015-11-28 DIAGNOSIS — N2581 Secondary hyperparathyroidism of renal origin: Secondary | ICD-10-CM | POA: Diagnosis not present

## 2015-11-28 DIAGNOSIS — L299 Pruritus, unspecified: Secondary | ICD-10-CM | POA: Diagnosis not present

## 2015-11-28 DIAGNOSIS — N186 End stage renal disease: Secondary | ICD-10-CM | POA: Diagnosis not present

## 2015-11-30 DIAGNOSIS — Z992 Dependence on renal dialysis: Secondary | ICD-10-CM | POA: Diagnosis not present

## 2015-11-30 DIAGNOSIS — R918 Other nonspecific abnormal finding of lung field: Secondary | ICD-10-CM | POA: Diagnosis not present

## 2015-11-30 DIAGNOSIS — I120 Hypertensive chronic kidney disease with stage 5 chronic kidney disease or end stage renal disease: Secondary | ICD-10-CM | POA: Diagnosis not present

## 2015-11-30 DIAGNOSIS — R000 Tachycardia, unspecified: Secondary | ICD-10-CM | POA: Diagnosis not present

## 2015-11-30 DIAGNOSIS — F17210 Nicotine dependence, cigarettes, uncomplicated: Secondary | ICD-10-CM | POA: Diagnosis not present

## 2015-11-30 DIAGNOSIS — D509 Iron deficiency anemia, unspecified: Secondary | ICD-10-CM | POA: Diagnosis not present

## 2015-11-30 DIAGNOSIS — D631 Anemia in chronic kidney disease: Secondary | ICD-10-CM | POA: Diagnosis not present

## 2015-11-30 DIAGNOSIS — I517 Cardiomegaly: Secondary | ICD-10-CM | POA: Diagnosis not present

## 2015-11-30 DIAGNOSIS — N186 End stage renal disease: Secondary | ICD-10-CM | POA: Diagnosis not present

## 2015-11-30 DIAGNOSIS — R7989 Other specified abnormal findings of blood chemistry: Secondary | ICD-10-CM | POA: Diagnosis not present

## 2015-11-30 DIAGNOSIS — N2581 Secondary hyperparathyroidism of renal origin: Secondary | ICD-10-CM | POA: Diagnosis not present

## 2015-11-30 DIAGNOSIS — L299 Pruritus, unspecified: Secondary | ICD-10-CM | POA: Diagnosis not present

## 2015-11-30 DIAGNOSIS — I471 Supraventricular tachycardia: Secondary | ICD-10-CM | POA: Diagnosis not present

## 2015-11-30 DIAGNOSIS — I10 Essential (primary) hypertension: Secondary | ICD-10-CM | POA: Diagnosis not present

## 2015-12-03 DIAGNOSIS — D631 Anemia in chronic kidney disease: Secondary | ICD-10-CM | POA: Diagnosis not present

## 2015-12-03 DIAGNOSIS — D509 Iron deficiency anemia, unspecified: Secondary | ICD-10-CM | POA: Diagnosis not present

## 2015-12-03 DIAGNOSIS — N2581 Secondary hyperparathyroidism of renal origin: Secondary | ICD-10-CM | POA: Diagnosis not present

## 2015-12-03 DIAGNOSIS — N186 End stage renal disease: Secondary | ICD-10-CM | POA: Diagnosis not present

## 2015-12-03 DIAGNOSIS — Z992 Dependence on renal dialysis: Secondary | ICD-10-CM | POA: Diagnosis not present

## 2015-12-03 DIAGNOSIS — L299 Pruritus, unspecified: Secondary | ICD-10-CM | POA: Diagnosis not present

## 2015-12-05 DIAGNOSIS — D631 Anemia in chronic kidney disease: Secondary | ICD-10-CM | POA: Diagnosis not present

## 2015-12-05 DIAGNOSIS — L299 Pruritus, unspecified: Secondary | ICD-10-CM | POA: Diagnosis not present

## 2015-12-05 DIAGNOSIS — D509 Iron deficiency anemia, unspecified: Secondary | ICD-10-CM | POA: Diagnosis not present

## 2015-12-05 DIAGNOSIS — N186 End stage renal disease: Secondary | ICD-10-CM | POA: Diagnosis not present

## 2015-12-05 DIAGNOSIS — N2581 Secondary hyperparathyroidism of renal origin: Secondary | ICD-10-CM | POA: Diagnosis not present

## 2015-12-07 DIAGNOSIS — L299 Pruritus, unspecified: Secondary | ICD-10-CM | POA: Diagnosis not present

## 2015-12-07 DIAGNOSIS — N2581 Secondary hyperparathyroidism of renal origin: Secondary | ICD-10-CM | POA: Diagnosis not present

## 2015-12-07 DIAGNOSIS — D631 Anemia in chronic kidney disease: Secondary | ICD-10-CM | POA: Diagnosis not present

## 2015-12-07 DIAGNOSIS — N186 End stage renal disease: Secondary | ICD-10-CM | POA: Diagnosis not present

## 2015-12-07 DIAGNOSIS — D509 Iron deficiency anemia, unspecified: Secondary | ICD-10-CM | POA: Diagnosis not present

## 2015-12-10 DIAGNOSIS — N186 End stage renal disease: Secondary | ICD-10-CM | POA: Diagnosis not present

## 2015-12-10 DIAGNOSIS — D631 Anemia in chronic kidney disease: Secondary | ICD-10-CM | POA: Diagnosis not present

## 2015-12-10 DIAGNOSIS — D509 Iron deficiency anemia, unspecified: Secondary | ICD-10-CM | POA: Diagnosis not present

## 2015-12-10 DIAGNOSIS — L299 Pruritus, unspecified: Secondary | ICD-10-CM | POA: Diagnosis not present

## 2015-12-10 DIAGNOSIS — N2581 Secondary hyperparathyroidism of renal origin: Secondary | ICD-10-CM | POA: Diagnosis not present

## 2015-12-12 DIAGNOSIS — N186 End stage renal disease: Secondary | ICD-10-CM | POA: Diagnosis not present

## 2015-12-12 DIAGNOSIS — D509 Iron deficiency anemia, unspecified: Secondary | ICD-10-CM | POA: Diagnosis not present

## 2015-12-12 DIAGNOSIS — N2581 Secondary hyperparathyroidism of renal origin: Secondary | ICD-10-CM | POA: Diagnosis not present

## 2015-12-12 DIAGNOSIS — D631 Anemia in chronic kidney disease: Secondary | ICD-10-CM | POA: Diagnosis not present

## 2015-12-12 DIAGNOSIS — L299 Pruritus, unspecified: Secondary | ICD-10-CM | POA: Diagnosis not present

## 2015-12-14 DIAGNOSIS — L299 Pruritus, unspecified: Secondary | ICD-10-CM | POA: Diagnosis not present

## 2015-12-14 DIAGNOSIS — N2581 Secondary hyperparathyroidism of renal origin: Secondary | ICD-10-CM | POA: Diagnosis not present

## 2015-12-14 DIAGNOSIS — D509 Iron deficiency anemia, unspecified: Secondary | ICD-10-CM | POA: Diagnosis not present

## 2015-12-14 DIAGNOSIS — D631 Anemia in chronic kidney disease: Secondary | ICD-10-CM | POA: Diagnosis not present

## 2015-12-14 DIAGNOSIS — N186 End stage renal disease: Secondary | ICD-10-CM | POA: Diagnosis not present

## 2015-12-17 DIAGNOSIS — D509 Iron deficiency anemia, unspecified: Secondary | ICD-10-CM | POA: Diagnosis not present

## 2015-12-17 DIAGNOSIS — N2581 Secondary hyperparathyroidism of renal origin: Secondary | ICD-10-CM | POA: Diagnosis not present

## 2015-12-17 DIAGNOSIS — N186 End stage renal disease: Secondary | ICD-10-CM | POA: Diagnosis not present

## 2015-12-17 DIAGNOSIS — L299 Pruritus, unspecified: Secondary | ICD-10-CM | POA: Diagnosis not present

## 2015-12-17 DIAGNOSIS — D631 Anemia in chronic kidney disease: Secondary | ICD-10-CM | POA: Diagnosis not present

## 2015-12-19 DIAGNOSIS — D631 Anemia in chronic kidney disease: Secondary | ICD-10-CM | POA: Diagnosis not present

## 2015-12-19 DIAGNOSIS — L299 Pruritus, unspecified: Secondary | ICD-10-CM | POA: Diagnosis not present

## 2015-12-19 DIAGNOSIS — N186 End stage renal disease: Secondary | ICD-10-CM | POA: Diagnosis not present

## 2015-12-19 DIAGNOSIS — D509 Iron deficiency anemia, unspecified: Secondary | ICD-10-CM | POA: Diagnosis not present

## 2015-12-19 DIAGNOSIS — N2581 Secondary hyperparathyroidism of renal origin: Secondary | ICD-10-CM | POA: Diagnosis not present

## 2015-12-21 DIAGNOSIS — N2581 Secondary hyperparathyroidism of renal origin: Secondary | ICD-10-CM | POA: Diagnosis not present

## 2015-12-21 DIAGNOSIS — N186 End stage renal disease: Secondary | ICD-10-CM | POA: Diagnosis not present

## 2015-12-21 DIAGNOSIS — L299 Pruritus, unspecified: Secondary | ICD-10-CM | POA: Diagnosis not present

## 2015-12-21 DIAGNOSIS — D509 Iron deficiency anemia, unspecified: Secondary | ICD-10-CM | POA: Diagnosis not present

## 2015-12-21 DIAGNOSIS — D631 Anemia in chronic kidney disease: Secondary | ICD-10-CM | POA: Diagnosis not present

## 2015-12-24 DIAGNOSIS — N186 End stage renal disease: Secondary | ICD-10-CM | POA: Diagnosis not present

## 2015-12-24 DIAGNOSIS — N2581 Secondary hyperparathyroidism of renal origin: Secondary | ICD-10-CM | POA: Diagnosis not present

## 2015-12-24 DIAGNOSIS — L299 Pruritus, unspecified: Secondary | ICD-10-CM | POA: Diagnosis not present

## 2015-12-24 DIAGNOSIS — D509 Iron deficiency anemia, unspecified: Secondary | ICD-10-CM | POA: Diagnosis not present

## 2015-12-24 DIAGNOSIS — D631 Anemia in chronic kidney disease: Secondary | ICD-10-CM | POA: Diagnosis not present

## 2015-12-26 DIAGNOSIS — D631 Anemia in chronic kidney disease: Secondary | ICD-10-CM | POA: Diagnosis not present

## 2015-12-26 DIAGNOSIS — D509 Iron deficiency anemia, unspecified: Secondary | ICD-10-CM | POA: Diagnosis not present

## 2015-12-26 DIAGNOSIS — N2581 Secondary hyperparathyroidism of renal origin: Secondary | ICD-10-CM | POA: Diagnosis not present

## 2015-12-26 DIAGNOSIS — N186 End stage renal disease: Secondary | ICD-10-CM | POA: Diagnosis not present

## 2015-12-26 DIAGNOSIS — L299 Pruritus, unspecified: Secondary | ICD-10-CM | POA: Diagnosis not present

## 2015-12-28 DIAGNOSIS — N2581 Secondary hyperparathyroidism of renal origin: Secondary | ICD-10-CM | POA: Diagnosis not present

## 2015-12-28 DIAGNOSIS — L299 Pruritus, unspecified: Secondary | ICD-10-CM | POA: Diagnosis not present

## 2015-12-28 DIAGNOSIS — D509 Iron deficiency anemia, unspecified: Secondary | ICD-10-CM | POA: Diagnosis not present

## 2015-12-28 DIAGNOSIS — N186 End stage renal disease: Secondary | ICD-10-CM | POA: Diagnosis not present

## 2015-12-28 DIAGNOSIS — D631 Anemia in chronic kidney disease: Secondary | ICD-10-CM | POA: Diagnosis not present

## 2015-12-31 DIAGNOSIS — N2581 Secondary hyperparathyroidism of renal origin: Secondary | ICD-10-CM | POA: Diagnosis not present

## 2015-12-31 DIAGNOSIS — D509 Iron deficiency anemia, unspecified: Secondary | ICD-10-CM | POA: Diagnosis not present

## 2015-12-31 DIAGNOSIS — D631 Anemia in chronic kidney disease: Secondary | ICD-10-CM | POA: Diagnosis not present

## 2015-12-31 DIAGNOSIS — L299 Pruritus, unspecified: Secondary | ICD-10-CM | POA: Diagnosis not present

## 2015-12-31 DIAGNOSIS — N186 End stage renal disease: Secondary | ICD-10-CM | POA: Diagnosis not present

## 2016-01-02 DIAGNOSIS — N186 End stage renal disease: Secondary | ICD-10-CM | POA: Diagnosis not present

## 2016-01-02 DIAGNOSIS — Z992 Dependence on renal dialysis: Secondary | ICD-10-CM | POA: Diagnosis not present

## 2016-01-02 DIAGNOSIS — L299 Pruritus, unspecified: Secondary | ICD-10-CM | POA: Diagnosis not present

## 2016-01-02 DIAGNOSIS — N2581 Secondary hyperparathyroidism of renal origin: Secondary | ICD-10-CM | POA: Diagnosis not present

## 2016-01-02 DIAGNOSIS — D509 Iron deficiency anemia, unspecified: Secondary | ICD-10-CM | POA: Diagnosis not present

## 2016-01-02 DIAGNOSIS — D631 Anemia in chronic kidney disease: Secondary | ICD-10-CM | POA: Diagnosis not present

## 2016-01-04 DIAGNOSIS — N186 End stage renal disease: Secondary | ICD-10-CM | POA: Diagnosis not present

## 2016-01-04 DIAGNOSIS — L299 Pruritus, unspecified: Secondary | ICD-10-CM | POA: Diagnosis not present

## 2016-01-04 DIAGNOSIS — D509 Iron deficiency anemia, unspecified: Secondary | ICD-10-CM | POA: Diagnosis not present

## 2016-01-04 DIAGNOSIS — N2581 Secondary hyperparathyroidism of renal origin: Secondary | ICD-10-CM | POA: Diagnosis not present

## 2016-01-04 DIAGNOSIS — D631 Anemia in chronic kidney disease: Secondary | ICD-10-CM | POA: Diagnosis not present

## 2016-01-07 DIAGNOSIS — D509 Iron deficiency anemia, unspecified: Secondary | ICD-10-CM | POA: Diagnosis not present

## 2016-01-07 DIAGNOSIS — D631 Anemia in chronic kidney disease: Secondary | ICD-10-CM | POA: Diagnosis not present

## 2016-01-07 DIAGNOSIS — N186 End stage renal disease: Secondary | ICD-10-CM | POA: Diagnosis not present

## 2016-01-07 DIAGNOSIS — L299 Pruritus, unspecified: Secondary | ICD-10-CM | POA: Diagnosis not present

## 2016-01-07 DIAGNOSIS — N2581 Secondary hyperparathyroidism of renal origin: Secondary | ICD-10-CM | POA: Diagnosis not present

## 2016-01-09 DIAGNOSIS — D631 Anemia in chronic kidney disease: Secondary | ICD-10-CM | POA: Diagnosis not present

## 2016-01-09 DIAGNOSIS — N2581 Secondary hyperparathyroidism of renal origin: Secondary | ICD-10-CM | POA: Diagnosis not present

## 2016-01-09 DIAGNOSIS — D509 Iron deficiency anemia, unspecified: Secondary | ICD-10-CM | POA: Diagnosis not present

## 2016-01-09 DIAGNOSIS — N186 End stage renal disease: Secondary | ICD-10-CM | POA: Diagnosis not present

## 2016-01-09 DIAGNOSIS — L299 Pruritus, unspecified: Secondary | ICD-10-CM | POA: Diagnosis not present

## 2016-01-11 DIAGNOSIS — L299 Pruritus, unspecified: Secondary | ICD-10-CM | POA: Diagnosis not present

## 2016-01-11 DIAGNOSIS — D509 Iron deficiency anemia, unspecified: Secondary | ICD-10-CM | POA: Diagnosis not present

## 2016-01-11 DIAGNOSIS — N2581 Secondary hyperparathyroidism of renal origin: Secondary | ICD-10-CM | POA: Diagnosis not present

## 2016-01-11 DIAGNOSIS — D631 Anemia in chronic kidney disease: Secondary | ICD-10-CM | POA: Diagnosis not present

## 2016-01-11 DIAGNOSIS — N186 End stage renal disease: Secondary | ICD-10-CM | POA: Diagnosis not present

## 2016-01-14 DIAGNOSIS — D631 Anemia in chronic kidney disease: Secondary | ICD-10-CM | POA: Diagnosis not present

## 2016-01-14 DIAGNOSIS — L299 Pruritus, unspecified: Secondary | ICD-10-CM | POA: Diagnosis not present

## 2016-01-14 DIAGNOSIS — N2581 Secondary hyperparathyroidism of renal origin: Secondary | ICD-10-CM | POA: Diagnosis not present

## 2016-01-14 DIAGNOSIS — D509 Iron deficiency anemia, unspecified: Secondary | ICD-10-CM | POA: Diagnosis not present

## 2016-01-14 DIAGNOSIS — N186 End stage renal disease: Secondary | ICD-10-CM | POA: Diagnosis not present

## 2016-01-16 DIAGNOSIS — L299 Pruritus, unspecified: Secondary | ICD-10-CM | POA: Diagnosis not present

## 2016-01-16 DIAGNOSIS — N2581 Secondary hyperparathyroidism of renal origin: Secondary | ICD-10-CM | POA: Diagnosis not present

## 2016-01-16 DIAGNOSIS — N186 End stage renal disease: Secondary | ICD-10-CM | POA: Diagnosis not present

## 2016-01-16 DIAGNOSIS — D631 Anemia in chronic kidney disease: Secondary | ICD-10-CM | POA: Diagnosis not present

## 2016-01-16 DIAGNOSIS — D509 Iron deficiency anemia, unspecified: Secondary | ICD-10-CM | POA: Diagnosis not present

## 2016-01-18 DIAGNOSIS — D509 Iron deficiency anemia, unspecified: Secondary | ICD-10-CM | POA: Diagnosis not present

## 2016-01-18 DIAGNOSIS — N2581 Secondary hyperparathyroidism of renal origin: Secondary | ICD-10-CM | POA: Diagnosis not present

## 2016-01-18 DIAGNOSIS — L299 Pruritus, unspecified: Secondary | ICD-10-CM | POA: Diagnosis not present

## 2016-01-18 DIAGNOSIS — D631 Anemia in chronic kidney disease: Secondary | ICD-10-CM | POA: Diagnosis not present

## 2016-01-18 DIAGNOSIS — N186 End stage renal disease: Secondary | ICD-10-CM | POA: Diagnosis not present

## 2016-01-21 DIAGNOSIS — N2581 Secondary hyperparathyroidism of renal origin: Secondary | ICD-10-CM | POA: Diagnosis not present

## 2016-01-21 DIAGNOSIS — D631 Anemia in chronic kidney disease: Secondary | ICD-10-CM | POA: Diagnosis not present

## 2016-01-21 DIAGNOSIS — D509 Iron deficiency anemia, unspecified: Secondary | ICD-10-CM | POA: Diagnosis not present

## 2016-01-21 DIAGNOSIS — L299 Pruritus, unspecified: Secondary | ICD-10-CM | POA: Diagnosis not present

## 2016-01-21 DIAGNOSIS — N186 End stage renal disease: Secondary | ICD-10-CM | POA: Diagnosis not present

## 2016-01-23 DIAGNOSIS — L299 Pruritus, unspecified: Secondary | ICD-10-CM | POA: Diagnosis not present

## 2016-01-23 DIAGNOSIS — N186 End stage renal disease: Secondary | ICD-10-CM | POA: Diagnosis not present

## 2016-01-23 DIAGNOSIS — D509 Iron deficiency anemia, unspecified: Secondary | ICD-10-CM | POA: Diagnosis not present

## 2016-01-23 DIAGNOSIS — N2581 Secondary hyperparathyroidism of renal origin: Secondary | ICD-10-CM | POA: Diagnosis not present

## 2016-01-23 DIAGNOSIS — D631 Anemia in chronic kidney disease: Secondary | ICD-10-CM | POA: Diagnosis not present

## 2016-01-25 DIAGNOSIS — N2581 Secondary hyperparathyroidism of renal origin: Secondary | ICD-10-CM | POA: Diagnosis not present

## 2016-01-25 DIAGNOSIS — L299 Pruritus, unspecified: Secondary | ICD-10-CM | POA: Diagnosis not present

## 2016-01-25 DIAGNOSIS — D509 Iron deficiency anemia, unspecified: Secondary | ICD-10-CM | POA: Diagnosis not present

## 2016-01-25 DIAGNOSIS — D631 Anemia in chronic kidney disease: Secondary | ICD-10-CM | POA: Diagnosis not present

## 2016-01-25 DIAGNOSIS — N186 End stage renal disease: Secondary | ICD-10-CM | POA: Diagnosis not present

## 2016-01-28 DIAGNOSIS — Z940 Kidney transplant status: Secondary | ICD-10-CM | POA: Diagnosis not present

## 2016-01-28 DIAGNOSIS — D649 Anemia, unspecified: Secondary | ICD-10-CM | POA: Diagnosis not present

## 2016-01-28 DIAGNOSIS — I120 Hypertensive chronic kidney disease with stage 5 chronic kidney disease or end stage renal disease: Secondary | ICD-10-CM | POA: Diagnosis not present

## 2016-01-28 DIAGNOSIS — Z992 Dependence on renal dialysis: Secondary | ICD-10-CM | POA: Diagnosis not present

## 2016-01-28 DIAGNOSIS — Z79899 Other long term (current) drug therapy: Secondary | ICD-10-CM | POA: Diagnosis not present

## 2016-01-28 DIAGNOSIS — R0602 Shortness of breath: Secondary | ICD-10-CM | POA: Diagnosis not present

## 2016-01-28 DIAGNOSIS — N186 End stage renal disease: Secondary | ICD-10-CM | POA: Diagnosis not present

## 2016-01-28 DIAGNOSIS — F17210 Nicotine dependence, cigarettes, uncomplicated: Secondary | ICD-10-CM | POA: Diagnosis not present

## 2016-01-28 DIAGNOSIS — N250 Renal osteodystrophy: Secondary | ICD-10-CM | POA: Diagnosis not present

## 2016-01-28 DIAGNOSIS — Z1159 Encounter for screening for other viral diseases: Secondary | ICD-10-CM | POA: Diagnosis not present

## 2016-01-28 DIAGNOSIS — R000 Tachycardia, unspecified: Secondary | ICD-10-CM | POA: Diagnosis not present

## 2016-01-28 DIAGNOSIS — Z5181 Encounter for therapeutic drug level monitoring: Secondary | ICD-10-CM | POA: Diagnosis not present

## 2016-01-30 DIAGNOSIS — L299 Pruritus, unspecified: Secondary | ICD-10-CM | POA: Diagnosis not present

## 2016-01-30 DIAGNOSIS — D631 Anemia in chronic kidney disease: Secondary | ICD-10-CM | POA: Diagnosis not present

## 2016-01-30 DIAGNOSIS — D509 Iron deficiency anemia, unspecified: Secondary | ICD-10-CM | POA: Diagnosis not present

## 2016-01-30 DIAGNOSIS — N186 End stage renal disease: Secondary | ICD-10-CM | POA: Diagnosis not present

## 2016-01-30 DIAGNOSIS — N2581 Secondary hyperparathyroidism of renal origin: Secondary | ICD-10-CM | POA: Diagnosis not present

## 2016-02-01 DIAGNOSIS — N186 End stage renal disease: Secondary | ICD-10-CM | POA: Diagnosis not present

## 2016-02-01 DIAGNOSIS — N2581 Secondary hyperparathyroidism of renal origin: Secondary | ICD-10-CM | POA: Diagnosis not present

## 2016-02-01 DIAGNOSIS — D509 Iron deficiency anemia, unspecified: Secondary | ICD-10-CM | POA: Diagnosis not present

## 2016-02-01 DIAGNOSIS — L299 Pruritus, unspecified: Secondary | ICD-10-CM | POA: Diagnosis not present

## 2016-02-01 DIAGNOSIS — D631 Anemia in chronic kidney disease: Secondary | ICD-10-CM | POA: Diagnosis not present

## 2016-02-02 DIAGNOSIS — N186 End stage renal disease: Secondary | ICD-10-CM | POA: Diagnosis not present

## 2016-02-02 DIAGNOSIS — Z992 Dependence on renal dialysis: Secondary | ICD-10-CM | POA: Diagnosis not present

## 2016-02-04 DIAGNOSIS — N2581 Secondary hyperparathyroidism of renal origin: Secondary | ICD-10-CM | POA: Diagnosis not present

## 2016-02-04 DIAGNOSIS — D631 Anemia in chronic kidney disease: Secondary | ICD-10-CM | POA: Diagnosis not present

## 2016-02-04 DIAGNOSIS — L299 Pruritus, unspecified: Secondary | ICD-10-CM | POA: Diagnosis not present

## 2016-02-04 DIAGNOSIS — D509 Iron deficiency anemia, unspecified: Secondary | ICD-10-CM | POA: Diagnosis not present

## 2016-02-04 DIAGNOSIS — N186 End stage renal disease: Secondary | ICD-10-CM | POA: Diagnosis not present

## 2016-02-06 DIAGNOSIS — N2581 Secondary hyperparathyroidism of renal origin: Secondary | ICD-10-CM | POA: Diagnosis not present

## 2016-02-06 DIAGNOSIS — L299 Pruritus, unspecified: Secondary | ICD-10-CM | POA: Diagnosis not present

## 2016-02-06 DIAGNOSIS — D509 Iron deficiency anemia, unspecified: Secondary | ICD-10-CM | POA: Diagnosis not present

## 2016-02-06 DIAGNOSIS — D631 Anemia in chronic kidney disease: Secondary | ICD-10-CM | POA: Diagnosis not present

## 2016-02-06 DIAGNOSIS — N186 End stage renal disease: Secondary | ICD-10-CM | POA: Diagnosis not present

## 2016-02-08 DIAGNOSIS — D631 Anemia in chronic kidney disease: Secondary | ICD-10-CM | POA: Diagnosis not present

## 2016-02-08 DIAGNOSIS — N2581 Secondary hyperparathyroidism of renal origin: Secondary | ICD-10-CM | POA: Diagnosis not present

## 2016-02-08 DIAGNOSIS — N186 End stage renal disease: Secondary | ICD-10-CM | POA: Diagnosis not present

## 2016-02-08 DIAGNOSIS — D509 Iron deficiency anemia, unspecified: Secondary | ICD-10-CM | POA: Diagnosis not present

## 2016-02-08 DIAGNOSIS — L299 Pruritus, unspecified: Secondary | ICD-10-CM | POA: Diagnosis not present

## 2016-02-11 DIAGNOSIS — L299 Pruritus, unspecified: Secondary | ICD-10-CM | POA: Diagnosis not present

## 2016-02-11 DIAGNOSIS — D631 Anemia in chronic kidney disease: Secondary | ICD-10-CM | POA: Diagnosis not present

## 2016-02-11 DIAGNOSIS — D509 Iron deficiency anemia, unspecified: Secondary | ICD-10-CM | POA: Diagnosis not present

## 2016-02-11 DIAGNOSIS — N2581 Secondary hyperparathyroidism of renal origin: Secondary | ICD-10-CM | POA: Diagnosis not present

## 2016-02-11 DIAGNOSIS — N186 End stage renal disease: Secondary | ICD-10-CM | POA: Diagnosis not present

## 2016-02-13 DIAGNOSIS — D509 Iron deficiency anemia, unspecified: Secondary | ICD-10-CM | POA: Diagnosis not present

## 2016-02-13 DIAGNOSIS — L299 Pruritus, unspecified: Secondary | ICD-10-CM | POA: Diagnosis not present

## 2016-02-13 DIAGNOSIS — D631 Anemia in chronic kidney disease: Secondary | ICD-10-CM | POA: Diagnosis not present

## 2016-02-13 DIAGNOSIS — N2581 Secondary hyperparathyroidism of renal origin: Secondary | ICD-10-CM | POA: Diagnosis not present

## 2016-02-13 DIAGNOSIS — N186 End stage renal disease: Secondary | ICD-10-CM | POA: Diagnosis not present

## 2016-02-15 DIAGNOSIS — N2581 Secondary hyperparathyroidism of renal origin: Secondary | ICD-10-CM | POA: Diagnosis not present

## 2016-02-15 DIAGNOSIS — L299 Pruritus, unspecified: Secondary | ICD-10-CM | POA: Diagnosis not present

## 2016-02-15 DIAGNOSIS — N186 End stage renal disease: Secondary | ICD-10-CM | POA: Diagnosis not present

## 2016-02-15 DIAGNOSIS — D631 Anemia in chronic kidney disease: Secondary | ICD-10-CM | POA: Diagnosis not present

## 2016-02-15 DIAGNOSIS — D509 Iron deficiency anemia, unspecified: Secondary | ICD-10-CM | POA: Diagnosis not present

## 2016-02-18 DIAGNOSIS — D509 Iron deficiency anemia, unspecified: Secondary | ICD-10-CM | POA: Diagnosis not present

## 2016-02-18 DIAGNOSIS — N186 End stage renal disease: Secondary | ICD-10-CM | POA: Diagnosis not present

## 2016-02-18 DIAGNOSIS — N2581 Secondary hyperparathyroidism of renal origin: Secondary | ICD-10-CM | POA: Diagnosis not present

## 2016-02-18 DIAGNOSIS — D631 Anemia in chronic kidney disease: Secondary | ICD-10-CM | POA: Diagnosis not present

## 2016-02-18 DIAGNOSIS — L299 Pruritus, unspecified: Secondary | ICD-10-CM | POA: Diagnosis not present

## 2016-02-21 DIAGNOSIS — N186 End stage renal disease: Secondary | ICD-10-CM | POA: Diagnosis not present

## 2016-02-21 DIAGNOSIS — D631 Anemia in chronic kidney disease: Secondary | ICD-10-CM | POA: Diagnosis not present

## 2016-02-21 DIAGNOSIS — D509 Iron deficiency anemia, unspecified: Secondary | ICD-10-CM | POA: Diagnosis not present

## 2016-02-21 DIAGNOSIS — L299 Pruritus, unspecified: Secondary | ICD-10-CM | POA: Diagnosis not present

## 2016-02-21 DIAGNOSIS — N2581 Secondary hyperparathyroidism of renal origin: Secondary | ICD-10-CM | POA: Diagnosis not present

## 2016-02-25 DIAGNOSIS — N2581 Secondary hyperparathyroidism of renal origin: Secondary | ICD-10-CM | POA: Diagnosis not present

## 2016-02-25 DIAGNOSIS — L299 Pruritus, unspecified: Secondary | ICD-10-CM | POA: Diagnosis not present

## 2016-02-25 DIAGNOSIS — D509 Iron deficiency anemia, unspecified: Secondary | ICD-10-CM | POA: Diagnosis not present

## 2016-02-25 DIAGNOSIS — N186 End stage renal disease: Secondary | ICD-10-CM | POA: Diagnosis not present

## 2016-02-25 DIAGNOSIS — D631 Anemia in chronic kidney disease: Secondary | ICD-10-CM | POA: Diagnosis not present

## 2016-02-27 DIAGNOSIS — N2581 Secondary hyperparathyroidism of renal origin: Secondary | ICD-10-CM | POA: Diagnosis not present

## 2016-02-27 DIAGNOSIS — N186 End stage renal disease: Secondary | ICD-10-CM | POA: Diagnosis not present

## 2016-02-27 DIAGNOSIS — L299 Pruritus, unspecified: Secondary | ICD-10-CM | POA: Diagnosis not present

## 2016-02-27 DIAGNOSIS — D631 Anemia in chronic kidney disease: Secondary | ICD-10-CM | POA: Diagnosis not present

## 2016-02-27 DIAGNOSIS — D509 Iron deficiency anemia, unspecified: Secondary | ICD-10-CM | POA: Diagnosis not present

## 2016-02-29 DIAGNOSIS — D631 Anemia in chronic kidney disease: Secondary | ICD-10-CM | POA: Diagnosis not present

## 2016-02-29 DIAGNOSIS — L299 Pruritus, unspecified: Secondary | ICD-10-CM | POA: Diagnosis not present

## 2016-02-29 DIAGNOSIS — N2581 Secondary hyperparathyroidism of renal origin: Secondary | ICD-10-CM | POA: Diagnosis not present

## 2016-02-29 DIAGNOSIS — D509 Iron deficiency anemia, unspecified: Secondary | ICD-10-CM | POA: Diagnosis not present

## 2016-02-29 DIAGNOSIS — N186 End stage renal disease: Secondary | ICD-10-CM | POA: Diagnosis not present

## 2016-03-03 DIAGNOSIS — N186 End stage renal disease: Secondary | ICD-10-CM | POA: Diagnosis not present

## 2016-03-03 DIAGNOSIS — L299 Pruritus, unspecified: Secondary | ICD-10-CM | POA: Diagnosis not present

## 2016-03-03 DIAGNOSIS — D631 Anemia in chronic kidney disease: Secondary | ICD-10-CM | POA: Diagnosis not present

## 2016-03-03 DIAGNOSIS — D509 Iron deficiency anemia, unspecified: Secondary | ICD-10-CM | POA: Diagnosis not present

## 2016-03-03 DIAGNOSIS — N2581 Secondary hyperparathyroidism of renal origin: Secondary | ICD-10-CM | POA: Diagnosis not present

## 2016-03-04 DIAGNOSIS — N186 End stage renal disease: Secondary | ICD-10-CM | POA: Diagnosis not present

## 2016-03-04 DIAGNOSIS — Z992 Dependence on renal dialysis: Secondary | ICD-10-CM | POA: Diagnosis not present

## 2016-03-05 DIAGNOSIS — D509 Iron deficiency anemia, unspecified: Secondary | ICD-10-CM | POA: Diagnosis not present

## 2016-03-05 DIAGNOSIS — L299 Pruritus, unspecified: Secondary | ICD-10-CM | POA: Diagnosis not present

## 2016-03-05 DIAGNOSIS — N2581 Secondary hyperparathyroidism of renal origin: Secondary | ICD-10-CM | POA: Diagnosis not present

## 2016-03-05 DIAGNOSIS — D631 Anemia in chronic kidney disease: Secondary | ICD-10-CM | POA: Diagnosis not present

## 2016-03-05 DIAGNOSIS — N186 End stage renal disease: Secondary | ICD-10-CM | POA: Diagnosis not present

## 2016-03-07 DIAGNOSIS — D631 Anemia in chronic kidney disease: Secondary | ICD-10-CM | POA: Diagnosis not present

## 2016-03-07 DIAGNOSIS — L299 Pruritus, unspecified: Secondary | ICD-10-CM | POA: Diagnosis not present

## 2016-03-07 DIAGNOSIS — N186 End stage renal disease: Secondary | ICD-10-CM | POA: Diagnosis not present

## 2016-03-07 DIAGNOSIS — D509 Iron deficiency anemia, unspecified: Secondary | ICD-10-CM | POA: Diagnosis not present

## 2016-03-07 DIAGNOSIS — N2581 Secondary hyperparathyroidism of renal origin: Secondary | ICD-10-CM | POA: Diagnosis not present

## 2016-03-10 DIAGNOSIS — N2581 Secondary hyperparathyroidism of renal origin: Secondary | ICD-10-CM | POA: Diagnosis not present

## 2016-03-10 DIAGNOSIS — D509 Iron deficiency anemia, unspecified: Secondary | ICD-10-CM | POA: Diagnosis not present

## 2016-03-10 DIAGNOSIS — L299 Pruritus, unspecified: Secondary | ICD-10-CM | POA: Diagnosis not present

## 2016-03-10 DIAGNOSIS — N186 End stage renal disease: Secondary | ICD-10-CM | POA: Diagnosis not present

## 2016-03-10 DIAGNOSIS — D631 Anemia in chronic kidney disease: Secondary | ICD-10-CM | POA: Diagnosis not present

## 2016-03-12 DIAGNOSIS — N2581 Secondary hyperparathyroidism of renal origin: Secondary | ICD-10-CM | POA: Diagnosis not present

## 2016-03-12 DIAGNOSIS — L299 Pruritus, unspecified: Secondary | ICD-10-CM | POA: Diagnosis not present

## 2016-03-12 DIAGNOSIS — N186 End stage renal disease: Secondary | ICD-10-CM | POA: Diagnosis not present

## 2016-03-12 DIAGNOSIS — D631 Anemia in chronic kidney disease: Secondary | ICD-10-CM | POA: Diagnosis not present

## 2016-03-12 DIAGNOSIS — D509 Iron deficiency anemia, unspecified: Secondary | ICD-10-CM | POA: Diagnosis not present

## 2016-03-14 DIAGNOSIS — L299 Pruritus, unspecified: Secondary | ICD-10-CM | POA: Diagnosis not present

## 2016-03-14 DIAGNOSIS — D509 Iron deficiency anemia, unspecified: Secondary | ICD-10-CM | POA: Diagnosis not present

## 2016-03-14 DIAGNOSIS — N2581 Secondary hyperparathyroidism of renal origin: Secondary | ICD-10-CM | POA: Diagnosis not present

## 2016-03-14 DIAGNOSIS — D631 Anemia in chronic kidney disease: Secondary | ICD-10-CM | POA: Diagnosis not present

## 2016-03-14 DIAGNOSIS — N186 End stage renal disease: Secondary | ICD-10-CM | POA: Diagnosis not present

## 2016-03-17 DIAGNOSIS — L299 Pruritus, unspecified: Secondary | ICD-10-CM | POA: Diagnosis not present

## 2016-03-17 DIAGNOSIS — D631 Anemia in chronic kidney disease: Secondary | ICD-10-CM | POA: Diagnosis not present

## 2016-03-17 DIAGNOSIS — D509 Iron deficiency anemia, unspecified: Secondary | ICD-10-CM | POA: Diagnosis not present

## 2016-03-17 DIAGNOSIS — N186 End stage renal disease: Secondary | ICD-10-CM | POA: Diagnosis not present

## 2016-03-17 DIAGNOSIS — N2581 Secondary hyperparathyroidism of renal origin: Secondary | ICD-10-CM | POA: Diagnosis not present

## 2016-03-19 DIAGNOSIS — N186 End stage renal disease: Secondary | ICD-10-CM | POA: Diagnosis not present

## 2016-03-19 DIAGNOSIS — L299 Pruritus, unspecified: Secondary | ICD-10-CM | POA: Diagnosis not present

## 2016-03-19 DIAGNOSIS — N2581 Secondary hyperparathyroidism of renal origin: Secondary | ICD-10-CM | POA: Diagnosis not present

## 2016-03-19 DIAGNOSIS — D509 Iron deficiency anemia, unspecified: Secondary | ICD-10-CM | POA: Diagnosis not present

## 2016-03-19 DIAGNOSIS — D631 Anemia in chronic kidney disease: Secondary | ICD-10-CM | POA: Diagnosis not present

## 2016-03-21 DIAGNOSIS — N186 End stage renal disease: Secondary | ICD-10-CM | POA: Diagnosis not present

## 2016-03-21 DIAGNOSIS — N2581 Secondary hyperparathyroidism of renal origin: Secondary | ICD-10-CM | POA: Diagnosis not present

## 2016-03-21 DIAGNOSIS — L299 Pruritus, unspecified: Secondary | ICD-10-CM | POA: Diagnosis not present

## 2016-03-21 DIAGNOSIS — D631 Anemia in chronic kidney disease: Secondary | ICD-10-CM | POA: Diagnosis not present

## 2016-03-21 DIAGNOSIS — D509 Iron deficiency anemia, unspecified: Secondary | ICD-10-CM | POA: Diagnosis not present

## 2016-03-24 DIAGNOSIS — D631 Anemia in chronic kidney disease: Secondary | ICD-10-CM | POA: Diagnosis not present

## 2016-03-24 DIAGNOSIS — N2581 Secondary hyperparathyroidism of renal origin: Secondary | ICD-10-CM | POA: Diagnosis not present

## 2016-03-24 DIAGNOSIS — L299 Pruritus, unspecified: Secondary | ICD-10-CM | POA: Diagnosis not present

## 2016-03-24 DIAGNOSIS — D509 Iron deficiency anemia, unspecified: Secondary | ICD-10-CM | POA: Diagnosis not present

## 2016-03-24 DIAGNOSIS — N186 End stage renal disease: Secondary | ICD-10-CM | POA: Diagnosis not present

## 2016-03-26 DIAGNOSIS — D631 Anemia in chronic kidney disease: Secondary | ICD-10-CM | POA: Diagnosis not present

## 2016-03-26 DIAGNOSIS — N2581 Secondary hyperparathyroidism of renal origin: Secondary | ICD-10-CM | POA: Diagnosis not present

## 2016-03-26 DIAGNOSIS — N186 End stage renal disease: Secondary | ICD-10-CM | POA: Diagnosis not present

## 2016-03-26 DIAGNOSIS — L299 Pruritus, unspecified: Secondary | ICD-10-CM | POA: Diagnosis not present

## 2016-03-26 DIAGNOSIS — D509 Iron deficiency anemia, unspecified: Secondary | ICD-10-CM | POA: Diagnosis not present

## 2016-03-28 DIAGNOSIS — D631 Anemia in chronic kidney disease: Secondary | ICD-10-CM | POA: Diagnosis not present

## 2016-03-28 DIAGNOSIS — L299 Pruritus, unspecified: Secondary | ICD-10-CM | POA: Diagnosis not present

## 2016-03-28 DIAGNOSIS — D509 Iron deficiency anemia, unspecified: Secondary | ICD-10-CM | POA: Diagnosis not present

## 2016-03-28 DIAGNOSIS — N2581 Secondary hyperparathyroidism of renal origin: Secondary | ICD-10-CM | POA: Diagnosis not present

## 2016-03-28 DIAGNOSIS — N186 End stage renal disease: Secondary | ICD-10-CM | POA: Diagnosis not present

## 2016-03-31 DIAGNOSIS — N186 End stage renal disease: Secondary | ICD-10-CM | POA: Diagnosis not present

## 2016-03-31 DIAGNOSIS — D631 Anemia in chronic kidney disease: Secondary | ICD-10-CM | POA: Diagnosis not present

## 2016-03-31 DIAGNOSIS — L299 Pruritus, unspecified: Secondary | ICD-10-CM | POA: Diagnosis not present

## 2016-03-31 DIAGNOSIS — N2581 Secondary hyperparathyroidism of renal origin: Secondary | ICD-10-CM | POA: Diagnosis not present

## 2016-03-31 DIAGNOSIS — D509 Iron deficiency anemia, unspecified: Secondary | ICD-10-CM | POA: Diagnosis not present

## 2016-04-01 DIAGNOSIS — Z992 Dependence on renal dialysis: Secondary | ICD-10-CM | POA: Diagnosis not present

## 2016-04-01 DIAGNOSIS — N186 End stage renal disease: Secondary | ICD-10-CM | POA: Diagnosis not present

## 2016-04-02 DIAGNOSIS — D509 Iron deficiency anemia, unspecified: Secondary | ICD-10-CM | POA: Diagnosis not present

## 2016-04-02 DIAGNOSIS — N2581 Secondary hyperparathyroidism of renal origin: Secondary | ICD-10-CM | POA: Diagnosis not present

## 2016-04-02 DIAGNOSIS — D631 Anemia in chronic kidney disease: Secondary | ICD-10-CM | POA: Diagnosis not present

## 2016-04-02 DIAGNOSIS — N186 End stage renal disease: Secondary | ICD-10-CM | POA: Diagnosis not present

## 2016-04-02 DIAGNOSIS — L299 Pruritus, unspecified: Secondary | ICD-10-CM | POA: Diagnosis not present

## 2016-04-04 DIAGNOSIS — N186 End stage renal disease: Secondary | ICD-10-CM | POA: Diagnosis not present

## 2016-04-04 DIAGNOSIS — N2581 Secondary hyperparathyroidism of renal origin: Secondary | ICD-10-CM | POA: Diagnosis not present

## 2016-04-04 DIAGNOSIS — D509 Iron deficiency anemia, unspecified: Secondary | ICD-10-CM | POA: Diagnosis not present

## 2016-04-04 DIAGNOSIS — L299 Pruritus, unspecified: Secondary | ICD-10-CM | POA: Diagnosis not present

## 2016-04-04 DIAGNOSIS — D631 Anemia in chronic kidney disease: Secondary | ICD-10-CM | POA: Diagnosis not present

## 2016-04-07 DIAGNOSIS — N186 End stage renal disease: Secondary | ICD-10-CM | POA: Diagnosis not present

## 2016-04-07 DIAGNOSIS — N2581 Secondary hyperparathyroidism of renal origin: Secondary | ICD-10-CM | POA: Diagnosis not present

## 2016-04-07 DIAGNOSIS — L299 Pruritus, unspecified: Secondary | ICD-10-CM | POA: Diagnosis not present

## 2016-04-07 DIAGNOSIS — D631 Anemia in chronic kidney disease: Secondary | ICD-10-CM | POA: Diagnosis not present

## 2016-04-07 DIAGNOSIS — D509 Iron deficiency anemia, unspecified: Secondary | ICD-10-CM | POA: Diagnosis not present

## 2016-04-09 DIAGNOSIS — N2581 Secondary hyperparathyroidism of renal origin: Secondary | ICD-10-CM | POA: Diagnosis not present

## 2016-04-09 DIAGNOSIS — L299 Pruritus, unspecified: Secondary | ICD-10-CM | POA: Diagnosis not present

## 2016-04-09 DIAGNOSIS — D631 Anemia in chronic kidney disease: Secondary | ICD-10-CM | POA: Diagnosis not present

## 2016-04-09 DIAGNOSIS — N186 End stage renal disease: Secondary | ICD-10-CM | POA: Diagnosis not present

## 2016-04-09 DIAGNOSIS — D509 Iron deficiency anemia, unspecified: Secondary | ICD-10-CM | POA: Diagnosis not present

## 2016-04-11 DIAGNOSIS — N186 End stage renal disease: Secondary | ICD-10-CM | POA: Diagnosis not present

## 2016-04-11 DIAGNOSIS — N2581 Secondary hyperparathyroidism of renal origin: Secondary | ICD-10-CM | POA: Diagnosis not present

## 2016-04-11 DIAGNOSIS — D509 Iron deficiency anemia, unspecified: Secondary | ICD-10-CM | POA: Diagnosis not present

## 2016-04-11 DIAGNOSIS — L299 Pruritus, unspecified: Secondary | ICD-10-CM | POA: Diagnosis not present

## 2016-04-11 DIAGNOSIS — D631 Anemia in chronic kidney disease: Secondary | ICD-10-CM | POA: Diagnosis not present

## 2016-04-14 ENCOUNTER — Ambulatory Visit: Payer: Medicare Other

## 2016-04-14 ENCOUNTER — Ambulatory Visit
Admission: EM | Admit: 2016-04-14 | Discharge: 2016-04-14 | Disposition: A | Discharge status: Home / Self Care | Payer: Medicare Other | Attending: Family Medicine | Admitting: Family Medicine

## 2016-04-14 DIAGNOSIS — Z87891 Personal history of nicotine dependence: Secondary | ICD-10-CM | POA: Diagnosis not present

## 2016-04-14 DIAGNOSIS — S7002XA Contusion of left hip, initial encounter: Secondary | ICD-10-CM | POA: Diagnosis not present

## 2016-04-14 DIAGNOSIS — W010XXA Fall on same level from slipping, tripping and stumbling without subsequent striking against object, initial encounter: Secondary | ICD-10-CM | POA: Diagnosis not present

## 2016-04-14 DIAGNOSIS — L299 Pruritus, unspecified: Secondary | ICD-10-CM | POA: Diagnosis not present

## 2016-04-14 DIAGNOSIS — S72002A Fracture of unspecified part of neck of left femur, initial encounter for closed fracture: Secondary | ICD-10-CM | POA: Diagnosis not present

## 2016-04-14 DIAGNOSIS — N2581 Secondary hyperparathyroidism of renal origin: Secondary | ICD-10-CM | POA: Diagnosis not present

## 2016-04-14 DIAGNOSIS — M25552 Pain in left hip: Secondary | ICD-10-CM | POA: Diagnosis not present

## 2016-04-14 DIAGNOSIS — D509 Iron deficiency anemia, unspecified: Secondary | ICD-10-CM | POA: Diagnosis not present

## 2016-04-14 DIAGNOSIS — N186 End stage renal disease: Secondary | ICD-10-CM | POA: Diagnosis not present

## 2016-04-14 DIAGNOSIS — Z79899 Other long term (current) drug therapy: Secondary | ICD-10-CM | POA: Diagnosis not present

## 2016-04-14 DIAGNOSIS — Z880 Allergy status to penicillin: Secondary | ICD-10-CM | POA: Insufficient documentation

## 2016-04-14 DIAGNOSIS — D631 Anemia in chronic kidney disease: Secondary | ICD-10-CM | POA: Diagnosis not present

## 2016-04-14 DIAGNOSIS — S79912A Unspecified injury of left hip, initial encounter: Secondary | ICD-10-CM | POA: Diagnosis not present

## 2016-04-14 MED ORDER — TRAMADOL HCL 50 MG PO TABS: 50.0000 mg | ORAL_TABLET | Freq: Two times a day (BID) | ORAL | 0 refills | Status: DC | PRN

## 2016-04-14 NOTE — ED Triage Notes (Signed)
Pt fell in her bedroom this morning helping her significant other and landed on her left side.

## 2016-04-14 NOTE — Discharge Instructions (Signed)
Take medication as prescribed. Rest. Ice. Use crutches as needed.   Follow up with your primary care physician or your orthopedic this week as needed. Return to Urgent care for new or worsening concerns.

## 2016-04-14 NOTE — ED Provider Notes (Signed)
[facility] ____________________________________________  Time seen: Approximately 10:40 AM  I have reviewed the triage vital signs and the nursing notes.   HISTORY  Chief Complaint Hip Pain (left)   HPI Brittany Ramirez is a 51 y.o. female presenting for complaints of left hip pain post injury this morning at approximately 0630 at home. Patient reports she was walking through the bathroom and the bathroom rug came upwards with her feet, causing her to trip forward. Patient reports when she tripped she landed directly on her left hip and was able to use her left forearm to catch herself. Denies head injury or loss of consciousness. Reports she was able to get herself up and has remained ambulatory since. Patient reports that she then proceeded to her dialysis appointment this morning, and reports after sitting still for several hours she then had increased pain and stiffness to left hip. Reports mild pain to left elbow and describes that of stiffness and soreness, and states that her elbow pain is minimal and does not want x-rays of her left elbow.  Reports left hip pain is mild to moderate at worse when ambulating, minimal when sitting still. Denies pain radiation, paresthesias, decreased range of motion. Patient reports that she does have chronic hip issues and has been told multiple times that she needs a hip replacement. Denies any acute changes to her range of motion to left hip.  Denies chest pain, shortness of breath, abdominal pain, dysuria, or rash. Denies neck or back pain. States she fell only because she tripped over the rug. Denies recent sickness. Denies recent antibiotic use.    Past Medical History:  Diagnosis Date  . COPD (chronic obstructive pulmonary disease) (HCC)   . Cryptogenic organizing pneumonia (HCC)   . End stage renal disease (HCC)   . Essential hypertension   . Restrictive lung disease     Patient Active Problem List   Diagnosis Date  Noted  . COPD exacerbation (HCC) 02/13/2015  . Acute respiratory failure (HCC) 02/13/2015  . Pneumonia 02/13/2015  . CHF (congestive heart failure) (HCC) 02/12/2015    Past Surgical History:  Procedure Laterality Date  . AV fistula right lower arm Right      No current facility-administered medications for this encounter.   Current Outpatient Prescriptions:  .  albuterol (PROVENTIL HFA;VENTOLIN HFA) 108 (90 Base) MCG/ACT inhaler, Inhale 2 puffs into the lungs every 6 (six) hours as needed for wheezing or shortness of breath., Disp: , Rfl:  .  calcium carbonate (TUMS - DOSED IN MG ELEMENTAL CALCIUM) 500 MG chewable tablet, Chew 1 tablet by mouth 3 (three) times daily., Disp: , Rfl:  .  carvedilol (COREG) 25 MG tablet, Take 25 mg by mouth 2 (two) times daily., Disp: , Rfl:  .  cinacalcet (SENSIPAR) 30 MG tablet, Take 90 mg by mouth daily., Disp: , Rfl:  .  NIFEdipine (PROCARDIA XL/ADALAT-CC) 90 MG 24 hr tablet, Take 90 mg by mouth daily., Disp: , Rfl:  .  predniSONE (DELTASONE) 10 MG tablet, Take 10 mg by mouth daily., Disp: , Rfl:  .  predniSONE (DELTASONE) 10 MG tablet, Take 1 tablet (10 mg total) by mouth daily with breakfast. 40 mg PO (by mouth) x 1 days 30 mg PO x 2 days 20 mg PO x 2 days the resume  10 mg PO daily, Disp: 20 tablet, Rfl: 0 .  traMADol (ULTRAM) 50 MG tablet, Take 1 tablet (50 mg total) by mouth every 12 (twelve) hours as needed for  moderate pain (Do not drive or operate machinery while taking as can cause drowsiness.)., Disp: 8 tablet, Rfl: 0  Allergies Iron; Peanuts [peanut oil]; Penicillins; Potassium-containing compounds; Septra [sulfamethoxazole-trimethoprim]; Skelaxin [metaxalone]; Erythromycin; and Iodine  Family History  Problem Relation Age of Onset  . Diabetes Sister     Social History Social History  Substance Use Topics  . Smoking status: Former Smoker    Types: Cigarettes  . Smokeless tobacco: Never Used  . Alcohol use Yes     Comment: occas.      Review of Systems Constitutional: No fever/chills Eyes: No visual changes. Cardiovascular: Denies chest pain. Respiratory: Denies shortness of breath. Gastrointestinal: No abdominal pain.  No nausea, no vomiting.  No diarrhea.  No constipation. Genitourinary: Negative for dysuria. Musculoskeletal: Negative for back pain. Skin: Negative for rash. Neurological: Negative for focal weakness or numbness.  10-point ROS otherwise negative.  ____________________________________________   PHYSICAL EXAM:  VITAL SIGNS: ED Triage Vitals  Enc Vitals Group     BP 04/14/16 1019 123/81     Pulse Rate 04/14/16 1019 78     Resp 04/14/16 1019 18     Temp 04/14/16 1019 98.2 F (36.8 C)     Temp Source 04/14/16 1019 Oral     SpO2 04/14/16 1019 100 %     Weight 04/14/16 1018 149 lb 14.6 oz (68 kg)     Height 04/14/16 1018 5' (1.524 m)     Head Circumference --      Peak Flow --      Pain Score 04/14/16 1020 5     Pain Loc --      Pain Edu? --      Excl. in GC? --     Constitutional: Alert and oriented. Well appearing and in no acute distress. Eyes: Conjunctivae are normal. PERRL. EOMI. ENT      Head: Normocephalic and atraumatic. Cardiovascular: Normal rate, regular rhythm. Grossly normal heart sounds.  Good peripheral circulation. Respiratory: Normal respiratory effort without tachypnea nor retractions. Breath sounds are clear and equal bilaterally. No wheezes, rales, rhonchi. Gastrointestinal: Soft and nontender.  Musculoskeletal:  No midline cervical, thoracic or lumbar tenderness to palpation.   except: Left lateral hip and proximal femur mild to moderate tenderness to direct palpation with ecchymosis present to lateral hip, mild pain with hip flexion, moderate pain with hip abduction and limited hip abduction, able to stand and ambulate in room with mild pain, antalgic gait, normal distal sensation to bilateral lower extremities, left lower extremity otherwise nontender. Left  lateral elbow mild tenderness to palpation, full range of motion present, no edema noted, bilateral hand grips strong and equal.  Neurologic:  Normal speech and language. Speech is normal.  Skin:  Skin is warm, dry and intact. No rash noted. Psychiatric: Mood and affect are normal. Speech and behavior are normal. Patient exhibits appropriate insight and judgment   ___________________________________________   LABS (all labs ordered are listed, but only abnormal results are displayed)  Labs Reviewed - No data to display  RADIOLOGY  Dg Hip Unilat W Or Wo Pelvis 2-3 Views Left  Result Date: 04/14/2016 CLINICAL DATA:  Fall this morning.  Left hip pain. EXAM: DG HIP (WITH OR WITHOUT PELVIS) 2-3V LEFT COMPARISON:  CT 11/15/2006.  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 There may be a small amount of residual head in a dysplastic and deformed acetabulum. This may be related to bony resorption or prior resection. Chronic left  femoral neck fracture noted with superior translation of the femoral shaft relative to the acetabulum. No acute fracture. Trabeculated bones noted throughout the pelvis and proximal femurs bilaterally. IMPRESSION: Chronic deformity of the left proximal femur with bony resorption or resection of most or all of the left femoral head. Chronic femoral neck fracture with superior translation of the left femur relative to the dysplastic and deformed left acetabulum. No acute bony abnormality. Electronically Signed   By: Charlett Nose M.D.   On: 04/14/2016 11:50   Dg Femur Min 2 Views Left  Result Date: 04/14/2016 CLINICAL DATA:  Fall.  Left hip pain. EXAM: LEFT FEMUR 2 VIEWS COMPARISON:  Left hip series performed today. Pelvic MRI 10/19/2007. Pelvic CT 11/15/2006. FINDINGS: Chronic deformity of the proximal left femur with prior resection or resorption of the left femoral head. Chronic femoral neck fracture again noted with superior translation  of the femoral shaft relative to the deformed left acetabulum. Trabecular and subperiosteal resorption of the bone throughout much of the femur is stable since prior CT. No acute fracture. IMPRESSION: Chronic left femoral neck fracture with bony resorption or resection of the femoral head. Superior translation of the femoral shaft relative to the deformed acetabulum. No visible acute bony abnormality. Electronically Signed   By: Charlett Nose M.D.   On: 04/14/2016 11:52   ____________________________________________   PROCEDURES Procedures    INITIAL IMPRESSION / ASSESSMENT AND PLAN / ED COURSE  Pertinent labs & imaging results that were available during my care of the patient were reviewed by me and considered in my medical decision making (see chart for details).  Well-appearing patient. No acute distress. Left hip and proximal femur pain post mechanical fall this morning. Slightly limited hip abduction, but patient reports consistent with her normal period will evaluate left hip and left femur x-rays. Patient declines x-ray of left elbow.  Left hip and femur xrays, per radiologist see full report above, no acute bony abnormality. Patient reports follows with [facility] in [place], recommend follow up this week for any continued pain. Encouraged rest, ice and supportive care. Discussed use of a walker, patient declines walker and states that she would like to use crutches. Crutches given. Counseled regarding safety and protecting against further falls. Patient states that she will take home Tylenol as needed for pain, quantity 8 tramadol given as needed for breakthrough pain.Discussed indication, risks and benefits of medications with patient.   [place] controlled substance database reviewed, no controlled substances documented in last 1 year.  Discussed follow up with Primary care physician this week. Discussed follow up and return parameters including no resolution or any worsening  concerns. Patient verbalized understanding and agreed to plan.   ____________________________________________   FINAL CLINICAL IMPRESSION(S) / ED DIAGNOSES  Final diagnoses:  Left hip pain  Contusion of left hip, initial encounter     Discharge Medication List as of 04/14/2016 12:02 PM    START taking these medications   Details  traMADol (ULTRAM) 50 MG tablet Take 1 tablet (50 mg total) by mouth every 12 (twelve) hours as needed for moderate pain (Do not drive or operate machinery while taking as can cause drowsiness.)., Starting Tue 04/14/2016, Print        Note: This dictation was prepared with Dragon dictation along with smaller phrase technology. Any transcriptional errors that result from this process are unintentional.         Renford Dills, NP 04/14/16 1238

## 2016-04-16 DIAGNOSIS — N2581 Secondary hyperparathyroidism of renal origin: Secondary | ICD-10-CM | POA: Diagnosis not present

## 2016-04-16 DIAGNOSIS — L299 Pruritus, unspecified: Secondary | ICD-10-CM | POA: Diagnosis not present

## 2016-04-16 DIAGNOSIS — D631 Anemia in chronic kidney disease: Secondary | ICD-10-CM | POA: Diagnosis not present

## 2016-04-16 DIAGNOSIS — D509 Iron deficiency anemia, unspecified: Secondary | ICD-10-CM | POA: Diagnosis not present

## 2016-04-16 DIAGNOSIS — N186 End stage renal disease: Secondary | ICD-10-CM | POA: Diagnosis not present

## 2016-04-17 DIAGNOSIS — J984 Other disorders of lung: Secondary | ICD-10-CM | POA: Diagnosis not present

## 2016-04-17 DIAGNOSIS — N186 End stage renal disease: Secondary | ICD-10-CM | POA: Diagnosis not present

## 2016-04-18 DIAGNOSIS — D631 Anemia in chronic kidney disease: Secondary | ICD-10-CM | POA: Diagnosis not present

## 2016-04-18 DIAGNOSIS — N186 End stage renal disease: Secondary | ICD-10-CM | POA: Diagnosis not present

## 2016-04-18 DIAGNOSIS — L299 Pruritus, unspecified: Secondary | ICD-10-CM | POA: Diagnosis not present

## 2016-04-18 DIAGNOSIS — D509 Iron deficiency anemia, unspecified: Secondary | ICD-10-CM | POA: Diagnosis not present

## 2016-04-18 DIAGNOSIS — N2581 Secondary hyperparathyroidism of renal origin: Secondary | ICD-10-CM | POA: Diagnosis not present

## 2016-04-22 DIAGNOSIS — D631 Anemia in chronic kidney disease: Secondary | ICD-10-CM | POA: Diagnosis not present

## 2016-04-22 DIAGNOSIS — L299 Pruritus, unspecified: Secondary | ICD-10-CM | POA: Diagnosis not present

## 2016-04-22 DIAGNOSIS — N2581 Secondary hyperparathyroidism of renal origin: Secondary | ICD-10-CM | POA: Diagnosis not present

## 2016-04-22 DIAGNOSIS — N186 End stage renal disease: Secondary | ICD-10-CM | POA: Diagnosis not present

## 2016-04-22 DIAGNOSIS — D509 Iron deficiency anemia, unspecified: Secondary | ICD-10-CM | POA: Diagnosis not present

## 2016-04-25 DIAGNOSIS — L299 Pruritus, unspecified: Secondary | ICD-10-CM | POA: Diagnosis not present

## 2016-04-25 DIAGNOSIS — N186 End stage renal disease: Secondary | ICD-10-CM | POA: Diagnosis not present

## 2016-04-25 DIAGNOSIS — D509 Iron deficiency anemia, unspecified: Secondary | ICD-10-CM | POA: Diagnosis not present

## 2016-04-25 DIAGNOSIS — N2581 Secondary hyperparathyroidism of renal origin: Secondary | ICD-10-CM | POA: Diagnosis not present

## 2016-04-25 DIAGNOSIS — D631 Anemia in chronic kidney disease: Secondary | ICD-10-CM | POA: Diagnosis not present

## 2016-04-28 DIAGNOSIS — N186 End stage renal disease: Secondary | ICD-10-CM | POA: Diagnosis not present

## 2016-04-28 DIAGNOSIS — D631 Anemia in chronic kidney disease: Secondary | ICD-10-CM | POA: Diagnosis not present

## 2016-04-28 DIAGNOSIS — N2581 Secondary hyperparathyroidism of renal origin: Secondary | ICD-10-CM | POA: Diagnosis not present

## 2016-04-28 DIAGNOSIS — L299 Pruritus, unspecified: Secondary | ICD-10-CM | POA: Diagnosis not present

## 2016-04-28 DIAGNOSIS — D509 Iron deficiency anemia, unspecified: Secondary | ICD-10-CM | POA: Diagnosis not present

## 2016-04-30 DIAGNOSIS — D509 Iron deficiency anemia, unspecified: Secondary | ICD-10-CM | POA: Diagnosis not present

## 2016-04-30 DIAGNOSIS — N186 End stage renal disease: Secondary | ICD-10-CM | POA: Diagnosis not present

## 2016-04-30 DIAGNOSIS — L299 Pruritus, unspecified: Secondary | ICD-10-CM | POA: Diagnosis not present

## 2016-04-30 DIAGNOSIS — N2581 Secondary hyperparathyroidism of renal origin: Secondary | ICD-10-CM | POA: Diagnosis not present

## 2016-04-30 DIAGNOSIS — D631 Anemia in chronic kidney disease: Secondary | ICD-10-CM | POA: Diagnosis not present

## 2016-05-02 DIAGNOSIS — Z992 Dependence on renal dialysis: Secondary | ICD-10-CM | POA: Diagnosis not present

## 2016-05-02 DIAGNOSIS — D509 Iron deficiency anemia, unspecified: Secondary | ICD-10-CM | POA: Diagnosis not present

## 2016-05-02 DIAGNOSIS — L299 Pruritus, unspecified: Secondary | ICD-10-CM | POA: Diagnosis not present

## 2016-05-02 DIAGNOSIS — D631 Anemia in chronic kidney disease: Secondary | ICD-10-CM | POA: Diagnosis not present

## 2016-05-02 DIAGNOSIS — N186 End stage renal disease: Secondary | ICD-10-CM | POA: Diagnosis not present

## 2016-05-02 DIAGNOSIS — N2581 Secondary hyperparathyroidism of renal origin: Secondary | ICD-10-CM | POA: Diagnosis not present

## 2016-05-05 DIAGNOSIS — L299 Pruritus, unspecified: Secondary | ICD-10-CM | POA: Diagnosis not present

## 2016-05-05 DIAGNOSIS — N2581 Secondary hyperparathyroidism of renal origin: Secondary | ICD-10-CM | POA: Diagnosis not present

## 2016-05-05 DIAGNOSIS — N186 End stage renal disease: Secondary | ICD-10-CM | POA: Diagnosis not present

## 2016-05-05 DIAGNOSIS — D631 Anemia in chronic kidney disease: Secondary | ICD-10-CM | POA: Diagnosis not present

## 2016-05-05 DIAGNOSIS — D509 Iron deficiency anemia, unspecified: Secondary | ICD-10-CM | POA: Diagnosis not present

## 2016-05-06 DIAGNOSIS — I471 Supraventricular tachycardia: Secondary | ICD-10-CM | POA: Diagnosis not present

## 2016-05-06 DIAGNOSIS — Z992 Dependence on renal dialysis: Secondary | ICD-10-CM | POA: Diagnosis not present

## 2016-05-06 DIAGNOSIS — I350 Nonrheumatic aortic (valve) stenosis: Secondary | ICD-10-CM | POA: Diagnosis not present

## 2016-05-06 DIAGNOSIS — I2721 Secondary pulmonary arterial hypertension: Secondary | ICD-10-CM | POA: Diagnosis not present

## 2016-05-06 DIAGNOSIS — R000 Tachycardia, unspecified: Secondary | ICD-10-CM | POA: Diagnosis not present

## 2016-05-06 DIAGNOSIS — N186 End stage renal disease: Secondary | ICD-10-CM | POA: Diagnosis not present

## 2016-05-07 DIAGNOSIS — N2581 Secondary hyperparathyroidism of renal origin: Secondary | ICD-10-CM | POA: Diagnosis not present

## 2016-05-07 DIAGNOSIS — D631 Anemia in chronic kidney disease: Secondary | ICD-10-CM | POA: Diagnosis not present

## 2016-05-07 DIAGNOSIS — L299 Pruritus, unspecified: Secondary | ICD-10-CM | POA: Diagnosis not present

## 2016-05-07 DIAGNOSIS — D509 Iron deficiency anemia, unspecified: Secondary | ICD-10-CM | POA: Diagnosis not present

## 2016-05-07 DIAGNOSIS — N186 End stage renal disease: Secondary | ICD-10-CM | POA: Diagnosis not present

## 2016-05-09 DIAGNOSIS — N2581 Secondary hyperparathyroidism of renal origin: Secondary | ICD-10-CM | POA: Diagnosis not present

## 2016-05-09 DIAGNOSIS — D509 Iron deficiency anemia, unspecified: Secondary | ICD-10-CM | POA: Diagnosis not present

## 2016-05-09 DIAGNOSIS — D631 Anemia in chronic kidney disease: Secondary | ICD-10-CM | POA: Diagnosis not present

## 2016-05-09 DIAGNOSIS — N186 End stage renal disease: Secondary | ICD-10-CM | POA: Diagnosis not present

## 2016-05-09 DIAGNOSIS — L299 Pruritus, unspecified: Secondary | ICD-10-CM | POA: Diagnosis not present

## 2016-05-12 DIAGNOSIS — D631 Anemia in chronic kidney disease: Secondary | ICD-10-CM | POA: Diagnosis not present

## 2016-05-12 DIAGNOSIS — L299 Pruritus, unspecified: Secondary | ICD-10-CM | POA: Diagnosis not present

## 2016-05-12 DIAGNOSIS — N2581 Secondary hyperparathyroidism of renal origin: Secondary | ICD-10-CM | POA: Diagnosis not present

## 2016-05-12 DIAGNOSIS — N186 End stage renal disease: Secondary | ICD-10-CM | POA: Diagnosis not present

## 2016-05-12 DIAGNOSIS — D509 Iron deficiency anemia, unspecified: Secondary | ICD-10-CM | POA: Diagnosis not present

## 2016-05-14 DIAGNOSIS — N2581 Secondary hyperparathyroidism of renal origin: Secondary | ICD-10-CM | POA: Diagnosis not present

## 2016-05-14 DIAGNOSIS — D509 Iron deficiency anemia, unspecified: Secondary | ICD-10-CM | POA: Diagnosis not present

## 2016-05-14 DIAGNOSIS — D631 Anemia in chronic kidney disease: Secondary | ICD-10-CM | POA: Diagnosis not present

## 2016-05-14 DIAGNOSIS — N186 End stage renal disease: Secondary | ICD-10-CM | POA: Diagnosis not present

## 2016-05-14 DIAGNOSIS — L299 Pruritus, unspecified: Secondary | ICD-10-CM | POA: Diagnosis not present

## 2016-05-16 DIAGNOSIS — N2581 Secondary hyperparathyroidism of renal origin: Secondary | ICD-10-CM | POA: Diagnosis not present

## 2016-05-16 DIAGNOSIS — N186 End stage renal disease: Secondary | ICD-10-CM | POA: Diagnosis not present

## 2016-05-16 DIAGNOSIS — D509 Iron deficiency anemia, unspecified: Secondary | ICD-10-CM | POA: Diagnosis not present

## 2016-05-16 DIAGNOSIS — D631 Anemia in chronic kidney disease: Secondary | ICD-10-CM | POA: Diagnosis not present

## 2016-05-16 DIAGNOSIS — L299 Pruritus, unspecified: Secondary | ICD-10-CM | POA: Diagnosis not present

## 2016-05-19 DIAGNOSIS — L299 Pruritus, unspecified: Secondary | ICD-10-CM | POA: Diagnosis not present

## 2016-05-19 DIAGNOSIS — D509 Iron deficiency anemia, unspecified: Secondary | ICD-10-CM | POA: Diagnosis not present

## 2016-05-19 DIAGNOSIS — D631 Anemia in chronic kidney disease: Secondary | ICD-10-CM | POA: Diagnosis not present

## 2016-05-19 DIAGNOSIS — N2581 Secondary hyperparathyroidism of renal origin: Secondary | ICD-10-CM | POA: Diagnosis not present

## 2016-05-19 DIAGNOSIS — N186 End stage renal disease: Secondary | ICD-10-CM | POA: Diagnosis not present

## 2016-05-21 DIAGNOSIS — D509 Iron deficiency anemia, unspecified: Secondary | ICD-10-CM | POA: Diagnosis not present

## 2016-05-21 DIAGNOSIS — D631 Anemia in chronic kidney disease: Secondary | ICD-10-CM | POA: Diagnosis not present

## 2016-05-21 DIAGNOSIS — N2581 Secondary hyperparathyroidism of renal origin: Secondary | ICD-10-CM | POA: Diagnosis not present

## 2016-05-21 DIAGNOSIS — N186 End stage renal disease: Secondary | ICD-10-CM | POA: Diagnosis not present

## 2016-05-21 DIAGNOSIS — L299 Pruritus, unspecified: Secondary | ICD-10-CM | POA: Diagnosis not present

## 2016-05-23 DIAGNOSIS — N186 End stage renal disease: Secondary | ICD-10-CM | POA: Diagnosis not present

## 2016-05-23 DIAGNOSIS — L299 Pruritus, unspecified: Secondary | ICD-10-CM | POA: Diagnosis not present

## 2016-05-23 DIAGNOSIS — D631 Anemia in chronic kidney disease: Secondary | ICD-10-CM | POA: Diagnosis not present

## 2016-05-23 DIAGNOSIS — D509 Iron deficiency anemia, unspecified: Secondary | ICD-10-CM | POA: Diagnosis not present

## 2016-05-23 DIAGNOSIS — N2581 Secondary hyperparathyroidism of renal origin: Secondary | ICD-10-CM | POA: Diagnosis not present

## 2016-05-26 DIAGNOSIS — D509 Iron deficiency anemia, unspecified: Secondary | ICD-10-CM | POA: Diagnosis not present

## 2016-05-26 DIAGNOSIS — L299 Pruritus, unspecified: Secondary | ICD-10-CM | POA: Diagnosis not present

## 2016-05-26 DIAGNOSIS — N186 End stage renal disease: Secondary | ICD-10-CM | POA: Diagnosis not present

## 2016-05-26 DIAGNOSIS — N2581 Secondary hyperparathyroidism of renal origin: Secondary | ICD-10-CM | POA: Diagnosis not present

## 2016-05-26 DIAGNOSIS — D631 Anemia in chronic kidney disease: Secondary | ICD-10-CM | POA: Diagnosis not present

## 2016-05-28 DIAGNOSIS — L299 Pruritus, unspecified: Secondary | ICD-10-CM | POA: Diagnosis not present

## 2016-05-28 DIAGNOSIS — D509 Iron deficiency anemia, unspecified: Secondary | ICD-10-CM | POA: Diagnosis not present

## 2016-05-28 DIAGNOSIS — N186 End stage renal disease: Secondary | ICD-10-CM | POA: Diagnosis not present

## 2016-05-28 DIAGNOSIS — N2581 Secondary hyperparathyroidism of renal origin: Secondary | ICD-10-CM | POA: Diagnosis not present

## 2016-05-28 DIAGNOSIS — D631 Anemia in chronic kidney disease: Secondary | ICD-10-CM | POA: Diagnosis not present

## 2016-05-30 DIAGNOSIS — D509 Iron deficiency anemia, unspecified: Secondary | ICD-10-CM | POA: Diagnosis not present

## 2016-05-30 DIAGNOSIS — N2581 Secondary hyperparathyroidism of renal origin: Secondary | ICD-10-CM | POA: Diagnosis not present

## 2016-05-30 DIAGNOSIS — L299 Pruritus, unspecified: Secondary | ICD-10-CM | POA: Diagnosis not present

## 2016-05-30 DIAGNOSIS — D631 Anemia in chronic kidney disease: Secondary | ICD-10-CM | POA: Diagnosis not present

## 2016-05-30 DIAGNOSIS — N186 End stage renal disease: Secondary | ICD-10-CM | POA: Diagnosis not present

## 2016-06-01 DIAGNOSIS — N186 End stage renal disease: Secondary | ICD-10-CM | POA: Diagnosis not present

## 2016-06-01 DIAGNOSIS — Z992 Dependence on renal dialysis: Secondary | ICD-10-CM | POA: Diagnosis not present

## 2016-06-02 DIAGNOSIS — D509 Iron deficiency anemia, unspecified: Secondary | ICD-10-CM | POA: Diagnosis not present

## 2016-06-02 DIAGNOSIS — D631 Anemia in chronic kidney disease: Secondary | ICD-10-CM | POA: Diagnosis not present

## 2016-06-02 DIAGNOSIS — N2581 Secondary hyperparathyroidism of renal origin: Secondary | ICD-10-CM | POA: Diagnosis not present

## 2016-06-02 DIAGNOSIS — L299 Pruritus, unspecified: Secondary | ICD-10-CM | POA: Diagnosis not present

## 2016-06-02 DIAGNOSIS — N186 End stage renal disease: Secondary | ICD-10-CM | POA: Diagnosis not present

## 2016-06-04 DIAGNOSIS — N2581 Secondary hyperparathyroidism of renal origin: Secondary | ICD-10-CM | POA: Diagnosis not present

## 2016-06-04 DIAGNOSIS — D631 Anemia in chronic kidney disease: Secondary | ICD-10-CM | POA: Diagnosis not present

## 2016-06-04 DIAGNOSIS — L299 Pruritus, unspecified: Secondary | ICD-10-CM | POA: Diagnosis not present

## 2016-06-04 DIAGNOSIS — D509 Iron deficiency anemia, unspecified: Secondary | ICD-10-CM | POA: Diagnosis not present

## 2016-06-04 DIAGNOSIS — N186 End stage renal disease: Secondary | ICD-10-CM | POA: Diagnosis not present

## 2016-06-06 DIAGNOSIS — N2581 Secondary hyperparathyroidism of renal origin: Secondary | ICD-10-CM | POA: Diagnosis not present

## 2016-06-06 DIAGNOSIS — L299 Pruritus, unspecified: Secondary | ICD-10-CM | POA: Diagnosis not present

## 2016-06-06 DIAGNOSIS — D509 Iron deficiency anemia, unspecified: Secondary | ICD-10-CM | POA: Diagnosis not present

## 2016-06-06 DIAGNOSIS — D631 Anemia in chronic kidney disease: Secondary | ICD-10-CM | POA: Diagnosis not present

## 2016-06-06 DIAGNOSIS — N186 End stage renal disease: Secondary | ICD-10-CM | POA: Diagnosis not present

## 2016-06-09 DIAGNOSIS — N2581 Secondary hyperparathyroidism of renal origin: Secondary | ICD-10-CM | POA: Diagnosis not present

## 2016-06-09 DIAGNOSIS — L299 Pruritus, unspecified: Secondary | ICD-10-CM | POA: Diagnosis not present

## 2016-06-09 DIAGNOSIS — D509 Iron deficiency anemia, unspecified: Secondary | ICD-10-CM | POA: Diagnosis not present

## 2016-06-09 DIAGNOSIS — D631 Anemia in chronic kidney disease: Secondary | ICD-10-CM | POA: Diagnosis not present

## 2016-06-09 DIAGNOSIS — N186 End stage renal disease: Secondary | ICD-10-CM | POA: Diagnosis not present

## 2016-06-11 DIAGNOSIS — D631 Anemia in chronic kidney disease: Secondary | ICD-10-CM | POA: Diagnosis not present

## 2016-06-11 DIAGNOSIS — N186 End stage renal disease: Secondary | ICD-10-CM | POA: Diagnosis not present

## 2016-06-11 DIAGNOSIS — L299 Pruritus, unspecified: Secondary | ICD-10-CM | POA: Diagnosis not present

## 2016-06-11 DIAGNOSIS — D509 Iron deficiency anemia, unspecified: Secondary | ICD-10-CM | POA: Diagnosis not present

## 2016-06-11 DIAGNOSIS — N2581 Secondary hyperparathyroidism of renal origin: Secondary | ICD-10-CM | POA: Diagnosis not present

## 2016-06-13 DIAGNOSIS — D631 Anemia in chronic kidney disease: Secondary | ICD-10-CM | POA: Diagnosis not present

## 2016-06-13 DIAGNOSIS — D509 Iron deficiency anemia, unspecified: Secondary | ICD-10-CM | POA: Diagnosis not present

## 2016-06-13 DIAGNOSIS — L299 Pruritus, unspecified: Secondary | ICD-10-CM | POA: Diagnosis not present

## 2016-06-13 DIAGNOSIS — N2581 Secondary hyperparathyroidism of renal origin: Secondary | ICD-10-CM | POA: Diagnosis not present

## 2016-06-13 DIAGNOSIS — N186 End stage renal disease: Secondary | ICD-10-CM | POA: Diagnosis not present

## 2016-06-15 DIAGNOSIS — I350 Nonrheumatic aortic (valve) stenosis: Secondary | ICD-10-CM | POA: Diagnosis not present

## 2016-06-16 DIAGNOSIS — D631 Anemia in chronic kidney disease: Secondary | ICD-10-CM | POA: Diagnosis not present

## 2016-06-16 DIAGNOSIS — N186 End stage renal disease: Secondary | ICD-10-CM | POA: Diagnosis not present

## 2016-06-16 DIAGNOSIS — D509 Iron deficiency anemia, unspecified: Secondary | ICD-10-CM | POA: Diagnosis not present

## 2016-06-16 DIAGNOSIS — N2581 Secondary hyperparathyroidism of renal origin: Secondary | ICD-10-CM | POA: Diagnosis not present

## 2016-06-16 DIAGNOSIS — L299 Pruritus, unspecified: Secondary | ICD-10-CM | POA: Diagnosis not present

## 2016-06-18 DIAGNOSIS — N2581 Secondary hyperparathyroidism of renal origin: Secondary | ICD-10-CM | POA: Diagnosis not present

## 2016-06-18 DIAGNOSIS — D509 Iron deficiency anemia, unspecified: Secondary | ICD-10-CM | POA: Diagnosis not present

## 2016-06-18 DIAGNOSIS — N186 End stage renal disease: Secondary | ICD-10-CM | POA: Diagnosis not present

## 2016-06-18 DIAGNOSIS — D631 Anemia in chronic kidney disease: Secondary | ICD-10-CM | POA: Diagnosis not present

## 2016-06-18 DIAGNOSIS — L299 Pruritus, unspecified: Secondary | ICD-10-CM | POA: Diagnosis not present

## 2016-06-20 DIAGNOSIS — D509 Iron deficiency anemia, unspecified: Secondary | ICD-10-CM | POA: Diagnosis not present

## 2016-06-20 DIAGNOSIS — N186 End stage renal disease: Secondary | ICD-10-CM | POA: Diagnosis not present

## 2016-06-20 DIAGNOSIS — D631 Anemia in chronic kidney disease: Secondary | ICD-10-CM | POA: Diagnosis not present

## 2016-06-20 DIAGNOSIS — N2581 Secondary hyperparathyroidism of renal origin: Secondary | ICD-10-CM | POA: Diagnosis not present

## 2016-06-20 DIAGNOSIS — L299 Pruritus, unspecified: Secondary | ICD-10-CM | POA: Diagnosis not present

## 2016-06-23 DIAGNOSIS — D509 Iron deficiency anemia, unspecified: Secondary | ICD-10-CM | POA: Diagnosis not present

## 2016-06-23 DIAGNOSIS — N2581 Secondary hyperparathyroidism of renal origin: Secondary | ICD-10-CM | POA: Diagnosis not present

## 2016-06-23 DIAGNOSIS — L299 Pruritus, unspecified: Secondary | ICD-10-CM | POA: Diagnosis not present

## 2016-06-23 DIAGNOSIS — N186 End stage renal disease: Secondary | ICD-10-CM | POA: Diagnosis not present

## 2016-06-23 DIAGNOSIS — D631 Anemia in chronic kidney disease: Secondary | ICD-10-CM | POA: Diagnosis not present

## 2016-06-25 DIAGNOSIS — N186 End stage renal disease: Secondary | ICD-10-CM | POA: Diagnosis not present

## 2016-06-25 DIAGNOSIS — N2581 Secondary hyperparathyroidism of renal origin: Secondary | ICD-10-CM | POA: Diagnosis not present

## 2016-06-25 DIAGNOSIS — D509 Iron deficiency anemia, unspecified: Secondary | ICD-10-CM | POA: Diagnosis not present

## 2016-06-25 DIAGNOSIS — D631 Anemia in chronic kidney disease: Secondary | ICD-10-CM | POA: Diagnosis not present

## 2016-06-25 DIAGNOSIS — L299 Pruritus, unspecified: Secondary | ICD-10-CM | POA: Diagnosis not present

## 2016-06-27 DIAGNOSIS — D631 Anemia in chronic kidney disease: Secondary | ICD-10-CM | POA: Diagnosis not present

## 2016-06-27 DIAGNOSIS — D509 Iron deficiency anemia, unspecified: Secondary | ICD-10-CM | POA: Diagnosis not present

## 2016-06-27 DIAGNOSIS — N2581 Secondary hyperparathyroidism of renal origin: Secondary | ICD-10-CM | POA: Diagnosis not present

## 2016-06-27 DIAGNOSIS — L299 Pruritus, unspecified: Secondary | ICD-10-CM | POA: Diagnosis not present

## 2016-06-27 DIAGNOSIS — N186 End stage renal disease: Secondary | ICD-10-CM | POA: Diagnosis not present

## 2016-06-30 DIAGNOSIS — N2581 Secondary hyperparathyroidism of renal origin: Secondary | ICD-10-CM | POA: Diagnosis not present

## 2016-06-30 DIAGNOSIS — D631 Anemia in chronic kidney disease: Secondary | ICD-10-CM | POA: Diagnosis not present

## 2016-06-30 DIAGNOSIS — L299 Pruritus, unspecified: Secondary | ICD-10-CM | POA: Diagnosis not present

## 2016-06-30 DIAGNOSIS — N186 End stage renal disease: Secondary | ICD-10-CM | POA: Diagnosis not present

## 2016-06-30 DIAGNOSIS — D509 Iron deficiency anemia, unspecified: Secondary | ICD-10-CM | POA: Diagnosis not present

## 2016-07-02 DIAGNOSIS — L299 Pruritus, unspecified: Secondary | ICD-10-CM | POA: Diagnosis not present

## 2016-07-02 DIAGNOSIS — D509 Iron deficiency anemia, unspecified: Secondary | ICD-10-CM | POA: Diagnosis not present

## 2016-07-02 DIAGNOSIS — N2581 Secondary hyperparathyroidism of renal origin: Secondary | ICD-10-CM | POA: Diagnosis not present

## 2016-07-02 DIAGNOSIS — D631 Anemia in chronic kidney disease: Secondary | ICD-10-CM | POA: Diagnosis not present

## 2016-07-02 DIAGNOSIS — Z992 Dependence on renal dialysis: Secondary | ICD-10-CM | POA: Diagnosis not present

## 2016-07-02 DIAGNOSIS — N186 End stage renal disease: Secondary | ICD-10-CM | POA: Diagnosis not present

## 2016-07-04 DIAGNOSIS — N2581 Secondary hyperparathyroidism of renal origin: Secondary | ICD-10-CM | POA: Diagnosis not present

## 2016-07-04 DIAGNOSIS — L299 Pruritus, unspecified: Secondary | ICD-10-CM | POA: Diagnosis not present

## 2016-07-04 DIAGNOSIS — D509 Iron deficiency anemia, unspecified: Secondary | ICD-10-CM | POA: Diagnosis not present

## 2016-07-04 DIAGNOSIS — D631 Anemia in chronic kidney disease: Secondary | ICD-10-CM | POA: Diagnosis not present

## 2016-07-04 DIAGNOSIS — N186 End stage renal disease: Secondary | ICD-10-CM | POA: Diagnosis not present

## 2016-07-07 DIAGNOSIS — L299 Pruritus, unspecified: Secondary | ICD-10-CM | POA: Diagnosis not present

## 2016-07-07 DIAGNOSIS — D631 Anemia in chronic kidney disease: Secondary | ICD-10-CM | POA: Diagnosis not present

## 2016-07-07 DIAGNOSIS — N186 End stage renal disease: Secondary | ICD-10-CM | POA: Diagnosis not present

## 2016-07-07 DIAGNOSIS — N2581 Secondary hyperparathyroidism of renal origin: Secondary | ICD-10-CM | POA: Diagnosis not present

## 2016-07-07 DIAGNOSIS — D509 Iron deficiency anemia, unspecified: Secondary | ICD-10-CM | POA: Diagnosis not present

## 2016-07-09 DIAGNOSIS — D509 Iron deficiency anemia, unspecified: Secondary | ICD-10-CM | POA: Diagnosis not present

## 2016-07-09 DIAGNOSIS — D631 Anemia in chronic kidney disease: Secondary | ICD-10-CM | POA: Diagnosis not present

## 2016-07-09 DIAGNOSIS — L299 Pruritus, unspecified: Secondary | ICD-10-CM | POA: Diagnosis not present

## 2016-07-09 DIAGNOSIS — N2581 Secondary hyperparathyroidism of renal origin: Secondary | ICD-10-CM | POA: Diagnosis not present

## 2016-07-09 DIAGNOSIS — N186 End stage renal disease: Secondary | ICD-10-CM | POA: Diagnosis not present

## 2016-07-11 DIAGNOSIS — N186 End stage renal disease: Secondary | ICD-10-CM | POA: Diagnosis not present

## 2016-07-11 DIAGNOSIS — D509 Iron deficiency anemia, unspecified: Secondary | ICD-10-CM | POA: Diagnosis not present

## 2016-07-11 DIAGNOSIS — N2581 Secondary hyperparathyroidism of renal origin: Secondary | ICD-10-CM | POA: Diagnosis not present

## 2016-07-11 DIAGNOSIS — D631 Anemia in chronic kidney disease: Secondary | ICD-10-CM | POA: Diagnosis not present

## 2016-07-11 DIAGNOSIS — L299 Pruritus, unspecified: Secondary | ICD-10-CM | POA: Diagnosis not present

## 2016-07-14 DIAGNOSIS — D631 Anemia in chronic kidney disease: Secondary | ICD-10-CM | POA: Diagnosis not present

## 2016-07-14 DIAGNOSIS — N186 End stage renal disease: Secondary | ICD-10-CM | POA: Diagnosis not present

## 2016-07-14 DIAGNOSIS — D509 Iron deficiency anemia, unspecified: Secondary | ICD-10-CM | POA: Diagnosis not present

## 2016-07-14 DIAGNOSIS — L299 Pruritus, unspecified: Secondary | ICD-10-CM | POA: Diagnosis not present

## 2016-07-14 DIAGNOSIS — N2581 Secondary hyperparathyroidism of renal origin: Secondary | ICD-10-CM | POA: Diagnosis not present

## 2016-07-16 DIAGNOSIS — N2581 Secondary hyperparathyroidism of renal origin: Secondary | ICD-10-CM | POA: Diagnosis not present

## 2016-07-16 DIAGNOSIS — D631 Anemia in chronic kidney disease: Secondary | ICD-10-CM | POA: Diagnosis not present

## 2016-07-16 DIAGNOSIS — N186 End stage renal disease: Secondary | ICD-10-CM | POA: Diagnosis not present

## 2016-07-16 DIAGNOSIS — D509 Iron deficiency anemia, unspecified: Secondary | ICD-10-CM | POA: Diagnosis not present

## 2016-07-16 DIAGNOSIS — L299 Pruritus, unspecified: Secondary | ICD-10-CM | POA: Diagnosis not present

## 2016-07-18 DIAGNOSIS — D509 Iron deficiency anemia, unspecified: Secondary | ICD-10-CM | POA: Diagnosis not present

## 2016-07-18 DIAGNOSIS — N186 End stage renal disease: Secondary | ICD-10-CM | POA: Diagnosis not present

## 2016-07-18 DIAGNOSIS — N2581 Secondary hyperparathyroidism of renal origin: Secondary | ICD-10-CM | POA: Diagnosis not present

## 2016-07-18 DIAGNOSIS — D631 Anemia in chronic kidney disease: Secondary | ICD-10-CM | POA: Diagnosis not present

## 2016-07-18 DIAGNOSIS — L299 Pruritus, unspecified: Secondary | ICD-10-CM | POA: Diagnosis not present

## 2016-07-21 DIAGNOSIS — D631 Anemia in chronic kidney disease: Secondary | ICD-10-CM | POA: Diagnosis not present

## 2016-07-21 DIAGNOSIS — N2581 Secondary hyperparathyroidism of renal origin: Secondary | ICD-10-CM | POA: Diagnosis not present

## 2016-07-21 DIAGNOSIS — L299 Pruritus, unspecified: Secondary | ICD-10-CM | POA: Diagnosis not present

## 2016-07-21 DIAGNOSIS — N186 End stage renal disease: Secondary | ICD-10-CM | POA: Diagnosis not present

## 2016-07-21 DIAGNOSIS — D509 Iron deficiency anemia, unspecified: Secondary | ICD-10-CM | POA: Diagnosis not present

## 2016-07-23 DIAGNOSIS — N186 End stage renal disease: Secondary | ICD-10-CM | POA: Diagnosis not present

## 2016-07-23 DIAGNOSIS — D509 Iron deficiency anemia, unspecified: Secondary | ICD-10-CM | POA: Diagnosis not present

## 2016-07-23 DIAGNOSIS — D631 Anemia in chronic kidney disease: Secondary | ICD-10-CM | POA: Diagnosis not present

## 2016-07-23 DIAGNOSIS — L299 Pruritus, unspecified: Secondary | ICD-10-CM | POA: Diagnosis not present

## 2016-07-23 DIAGNOSIS — N2581 Secondary hyperparathyroidism of renal origin: Secondary | ICD-10-CM | POA: Diagnosis not present

## 2016-07-25 DIAGNOSIS — D509 Iron deficiency anemia, unspecified: Secondary | ICD-10-CM | POA: Diagnosis not present

## 2016-07-25 DIAGNOSIS — N186 End stage renal disease: Secondary | ICD-10-CM | POA: Diagnosis not present

## 2016-07-25 DIAGNOSIS — N2581 Secondary hyperparathyroidism of renal origin: Secondary | ICD-10-CM | POA: Diagnosis not present

## 2016-07-25 DIAGNOSIS — D631 Anemia in chronic kidney disease: Secondary | ICD-10-CM | POA: Diagnosis not present

## 2016-07-25 DIAGNOSIS — L299 Pruritus, unspecified: Secondary | ICD-10-CM | POA: Diagnosis not present

## 2016-07-28 DIAGNOSIS — D509 Iron deficiency anemia, unspecified: Secondary | ICD-10-CM | POA: Diagnosis not present

## 2016-07-28 DIAGNOSIS — N2581 Secondary hyperparathyroidism of renal origin: Secondary | ICD-10-CM | POA: Diagnosis not present

## 2016-07-28 DIAGNOSIS — N186 End stage renal disease: Secondary | ICD-10-CM | POA: Diagnosis not present

## 2016-07-28 DIAGNOSIS — L299 Pruritus, unspecified: Secondary | ICD-10-CM | POA: Diagnosis not present

## 2016-07-28 DIAGNOSIS — D631 Anemia in chronic kidney disease: Secondary | ICD-10-CM | POA: Diagnosis not present

## 2016-07-30 DIAGNOSIS — D509 Iron deficiency anemia, unspecified: Secondary | ICD-10-CM | POA: Diagnosis not present

## 2016-07-30 DIAGNOSIS — N186 End stage renal disease: Secondary | ICD-10-CM | POA: Diagnosis not present

## 2016-07-30 DIAGNOSIS — N2581 Secondary hyperparathyroidism of renal origin: Secondary | ICD-10-CM | POA: Diagnosis not present

## 2016-07-30 DIAGNOSIS — L299 Pruritus, unspecified: Secondary | ICD-10-CM | POA: Diagnosis not present

## 2016-07-30 DIAGNOSIS — D631 Anemia in chronic kidney disease: Secondary | ICD-10-CM | POA: Diagnosis not present

## 2016-08-01 DIAGNOSIS — D509 Iron deficiency anemia, unspecified: Secondary | ICD-10-CM | POA: Diagnosis not present

## 2016-08-01 DIAGNOSIS — D631 Anemia in chronic kidney disease: Secondary | ICD-10-CM | POA: Diagnosis not present

## 2016-08-01 DIAGNOSIS — N186 End stage renal disease: Secondary | ICD-10-CM | POA: Diagnosis not present

## 2016-08-01 DIAGNOSIS — L299 Pruritus, unspecified: Secondary | ICD-10-CM | POA: Diagnosis not present

## 2016-08-01 DIAGNOSIS — Z992 Dependence on renal dialysis: Secondary | ICD-10-CM | POA: Diagnosis not present

## 2016-08-01 DIAGNOSIS — N2581 Secondary hyperparathyroidism of renal origin: Secondary | ICD-10-CM | POA: Diagnosis not present

## 2016-08-04 DIAGNOSIS — N2581 Secondary hyperparathyroidism of renal origin: Secondary | ICD-10-CM | POA: Diagnosis not present

## 2016-08-04 DIAGNOSIS — D631 Anemia in chronic kidney disease: Secondary | ICD-10-CM | POA: Diagnosis not present

## 2016-08-04 DIAGNOSIS — D509 Iron deficiency anemia, unspecified: Secondary | ICD-10-CM | POA: Diagnosis not present

## 2016-08-04 DIAGNOSIS — L299 Pruritus, unspecified: Secondary | ICD-10-CM | POA: Diagnosis not present

## 2016-08-04 DIAGNOSIS — N186 End stage renal disease: Secondary | ICD-10-CM | POA: Diagnosis not present

## 2016-08-06 DIAGNOSIS — L299 Pruritus, unspecified: Secondary | ICD-10-CM | POA: Diagnosis not present

## 2016-08-06 DIAGNOSIS — D631 Anemia in chronic kidney disease: Secondary | ICD-10-CM | POA: Diagnosis not present

## 2016-08-06 DIAGNOSIS — D509 Iron deficiency anemia, unspecified: Secondary | ICD-10-CM | POA: Diagnosis not present

## 2016-08-06 DIAGNOSIS — N2581 Secondary hyperparathyroidism of renal origin: Secondary | ICD-10-CM | POA: Diagnosis not present

## 2016-08-06 DIAGNOSIS — N186 End stage renal disease: Secondary | ICD-10-CM | POA: Diagnosis not present

## 2016-08-08 DIAGNOSIS — D509 Iron deficiency anemia, unspecified: Secondary | ICD-10-CM | POA: Diagnosis not present

## 2016-08-08 DIAGNOSIS — N186 End stage renal disease: Secondary | ICD-10-CM | POA: Diagnosis not present

## 2016-08-08 DIAGNOSIS — L299 Pruritus, unspecified: Secondary | ICD-10-CM | POA: Diagnosis not present

## 2016-08-08 DIAGNOSIS — D631 Anemia in chronic kidney disease: Secondary | ICD-10-CM | POA: Diagnosis not present

## 2016-08-08 DIAGNOSIS — N2581 Secondary hyperparathyroidism of renal origin: Secondary | ICD-10-CM | POA: Diagnosis not present

## 2016-08-11 DIAGNOSIS — N186 End stage renal disease: Secondary | ICD-10-CM | POA: Diagnosis not present

## 2016-08-11 DIAGNOSIS — L299 Pruritus, unspecified: Secondary | ICD-10-CM | POA: Diagnosis not present

## 2016-08-11 DIAGNOSIS — D509 Iron deficiency anemia, unspecified: Secondary | ICD-10-CM | POA: Diagnosis not present

## 2016-08-11 DIAGNOSIS — N2581 Secondary hyperparathyroidism of renal origin: Secondary | ICD-10-CM | POA: Diagnosis not present

## 2016-08-11 DIAGNOSIS — D631 Anemia in chronic kidney disease: Secondary | ICD-10-CM | POA: Diagnosis not present

## 2016-08-13 DIAGNOSIS — D631 Anemia in chronic kidney disease: Secondary | ICD-10-CM | POA: Diagnosis not present

## 2016-08-13 DIAGNOSIS — N2581 Secondary hyperparathyroidism of renal origin: Secondary | ICD-10-CM | POA: Diagnosis not present

## 2016-08-13 DIAGNOSIS — N186 End stage renal disease: Secondary | ICD-10-CM | POA: Diagnosis not present

## 2016-08-13 DIAGNOSIS — L299 Pruritus, unspecified: Secondary | ICD-10-CM | POA: Diagnosis not present

## 2016-08-13 DIAGNOSIS — D509 Iron deficiency anemia, unspecified: Secondary | ICD-10-CM | POA: Diagnosis not present

## 2016-08-15 DIAGNOSIS — N186 End stage renal disease: Secondary | ICD-10-CM | POA: Diagnosis not present

## 2016-08-15 DIAGNOSIS — D509 Iron deficiency anemia, unspecified: Secondary | ICD-10-CM | POA: Diagnosis not present

## 2016-08-15 DIAGNOSIS — L299 Pruritus, unspecified: Secondary | ICD-10-CM | POA: Diagnosis not present

## 2016-08-15 DIAGNOSIS — N2581 Secondary hyperparathyroidism of renal origin: Secondary | ICD-10-CM | POA: Diagnosis not present

## 2016-08-15 DIAGNOSIS — D631 Anemia in chronic kidney disease: Secondary | ICD-10-CM | POA: Diagnosis not present

## 2016-08-18 DIAGNOSIS — D509 Iron deficiency anemia, unspecified: Secondary | ICD-10-CM | POA: Diagnosis not present

## 2016-08-18 DIAGNOSIS — D631 Anemia in chronic kidney disease: Secondary | ICD-10-CM | POA: Diagnosis not present

## 2016-08-18 DIAGNOSIS — N2581 Secondary hyperparathyroidism of renal origin: Secondary | ICD-10-CM | POA: Diagnosis not present

## 2016-08-18 DIAGNOSIS — N186 End stage renal disease: Secondary | ICD-10-CM | POA: Diagnosis not present

## 2016-08-18 DIAGNOSIS — L299 Pruritus, unspecified: Secondary | ICD-10-CM | POA: Diagnosis not present

## 2016-08-20 DIAGNOSIS — D509 Iron deficiency anemia, unspecified: Secondary | ICD-10-CM | POA: Diagnosis not present

## 2016-08-20 DIAGNOSIS — D631 Anemia in chronic kidney disease: Secondary | ICD-10-CM | POA: Diagnosis not present

## 2016-08-20 DIAGNOSIS — N186 End stage renal disease: Secondary | ICD-10-CM | POA: Diagnosis not present

## 2016-08-20 DIAGNOSIS — L299 Pruritus, unspecified: Secondary | ICD-10-CM | POA: Diagnosis not present

## 2016-08-20 DIAGNOSIS — N2581 Secondary hyperparathyroidism of renal origin: Secondary | ICD-10-CM | POA: Diagnosis not present

## 2016-08-22 DIAGNOSIS — N2581 Secondary hyperparathyroidism of renal origin: Secondary | ICD-10-CM | POA: Diagnosis not present

## 2016-08-22 DIAGNOSIS — D509 Iron deficiency anemia, unspecified: Secondary | ICD-10-CM | POA: Diagnosis not present

## 2016-08-22 DIAGNOSIS — D631 Anemia in chronic kidney disease: Secondary | ICD-10-CM | POA: Diagnosis not present

## 2016-08-22 DIAGNOSIS — L299 Pruritus, unspecified: Secondary | ICD-10-CM | POA: Diagnosis not present

## 2016-08-22 DIAGNOSIS — N186 End stage renal disease: Secondary | ICD-10-CM | POA: Diagnosis not present

## 2016-08-25 DIAGNOSIS — D509 Iron deficiency anemia, unspecified: Secondary | ICD-10-CM | POA: Diagnosis not present

## 2016-08-25 DIAGNOSIS — D631 Anemia in chronic kidney disease: Secondary | ICD-10-CM | POA: Diagnosis not present

## 2016-08-25 DIAGNOSIS — N186 End stage renal disease: Secondary | ICD-10-CM | POA: Diagnosis not present

## 2016-08-25 DIAGNOSIS — L299 Pruritus, unspecified: Secondary | ICD-10-CM | POA: Diagnosis not present

## 2016-08-25 DIAGNOSIS — N2581 Secondary hyperparathyroidism of renal origin: Secondary | ICD-10-CM | POA: Diagnosis not present

## 2016-08-27 DIAGNOSIS — L299 Pruritus, unspecified: Secondary | ICD-10-CM | POA: Diagnosis not present

## 2016-08-27 DIAGNOSIS — N186 End stage renal disease: Secondary | ICD-10-CM | POA: Diagnosis not present

## 2016-08-27 DIAGNOSIS — D509 Iron deficiency anemia, unspecified: Secondary | ICD-10-CM | POA: Diagnosis not present

## 2016-08-27 DIAGNOSIS — D631 Anemia in chronic kidney disease: Secondary | ICD-10-CM | POA: Diagnosis not present

## 2016-08-27 DIAGNOSIS — N2581 Secondary hyperparathyroidism of renal origin: Secondary | ICD-10-CM | POA: Diagnosis not present

## 2016-08-29 DIAGNOSIS — D631 Anemia in chronic kidney disease: Secondary | ICD-10-CM | POA: Diagnosis not present

## 2016-08-29 DIAGNOSIS — N2581 Secondary hyperparathyroidism of renal origin: Secondary | ICD-10-CM | POA: Diagnosis not present

## 2016-08-29 DIAGNOSIS — N186 End stage renal disease: Secondary | ICD-10-CM | POA: Diagnosis not present

## 2016-08-29 DIAGNOSIS — D509 Iron deficiency anemia, unspecified: Secondary | ICD-10-CM | POA: Diagnosis not present

## 2016-08-29 DIAGNOSIS — L299 Pruritus, unspecified: Secondary | ICD-10-CM | POA: Diagnosis not present

## 2016-09-01 DIAGNOSIS — N2581 Secondary hyperparathyroidism of renal origin: Secondary | ICD-10-CM | POA: Diagnosis not present

## 2016-09-01 DIAGNOSIS — L299 Pruritus, unspecified: Secondary | ICD-10-CM | POA: Diagnosis not present

## 2016-09-01 DIAGNOSIS — D509 Iron deficiency anemia, unspecified: Secondary | ICD-10-CM | POA: Diagnosis not present

## 2016-09-01 DIAGNOSIS — D631 Anemia in chronic kidney disease: Secondary | ICD-10-CM | POA: Diagnosis not present

## 2016-09-01 DIAGNOSIS — Z992 Dependence on renal dialysis: Secondary | ICD-10-CM | POA: Diagnosis not present

## 2016-09-01 DIAGNOSIS — N186 End stage renal disease: Secondary | ICD-10-CM | POA: Diagnosis not present

## 2016-09-03 DIAGNOSIS — D509 Iron deficiency anemia, unspecified: Secondary | ICD-10-CM | POA: Diagnosis not present

## 2016-09-03 DIAGNOSIS — N186 End stage renal disease: Secondary | ICD-10-CM | POA: Diagnosis not present

## 2016-09-03 DIAGNOSIS — L299 Pruritus, unspecified: Secondary | ICD-10-CM | POA: Diagnosis not present

## 2016-09-03 DIAGNOSIS — D631 Anemia in chronic kidney disease: Secondary | ICD-10-CM | POA: Diagnosis not present

## 2016-09-03 DIAGNOSIS — N2581 Secondary hyperparathyroidism of renal origin: Secondary | ICD-10-CM | POA: Diagnosis not present

## 2016-09-05 DIAGNOSIS — N2581 Secondary hyperparathyroidism of renal origin: Secondary | ICD-10-CM | POA: Diagnosis not present

## 2016-09-05 DIAGNOSIS — N186 End stage renal disease: Secondary | ICD-10-CM | POA: Diagnosis not present

## 2016-09-05 DIAGNOSIS — D631 Anemia in chronic kidney disease: Secondary | ICD-10-CM | POA: Diagnosis not present

## 2016-09-05 DIAGNOSIS — D509 Iron deficiency anemia, unspecified: Secondary | ICD-10-CM | POA: Diagnosis not present

## 2016-09-05 DIAGNOSIS — L299 Pruritus, unspecified: Secondary | ICD-10-CM | POA: Diagnosis not present

## 2016-09-08 DIAGNOSIS — D509 Iron deficiency anemia, unspecified: Secondary | ICD-10-CM | POA: Diagnosis not present

## 2016-09-08 DIAGNOSIS — L299 Pruritus, unspecified: Secondary | ICD-10-CM | POA: Diagnosis not present

## 2016-09-08 DIAGNOSIS — N186 End stage renal disease: Secondary | ICD-10-CM | POA: Diagnosis not present

## 2016-09-08 DIAGNOSIS — N2581 Secondary hyperparathyroidism of renal origin: Secondary | ICD-10-CM | POA: Diagnosis not present

## 2016-09-08 DIAGNOSIS — D631 Anemia in chronic kidney disease: Secondary | ICD-10-CM | POA: Diagnosis not present

## 2016-09-10 DIAGNOSIS — D631 Anemia in chronic kidney disease: Secondary | ICD-10-CM | POA: Diagnosis not present

## 2016-09-10 DIAGNOSIS — D509 Iron deficiency anemia, unspecified: Secondary | ICD-10-CM | POA: Diagnosis not present

## 2016-09-10 DIAGNOSIS — N186 End stage renal disease: Secondary | ICD-10-CM | POA: Diagnosis not present

## 2016-09-10 DIAGNOSIS — N2581 Secondary hyperparathyroidism of renal origin: Secondary | ICD-10-CM | POA: Diagnosis not present

## 2016-09-10 DIAGNOSIS — L299 Pruritus, unspecified: Secondary | ICD-10-CM | POA: Diagnosis not present

## 2016-09-12 ENCOUNTER — Other Ambulatory Visit
Admission: RE | Admit: 2016-09-12 | Discharge: 2016-09-12 | Disposition: A | Discharge status: Home / Self Care | Payer: Medicare Other | Source: Ambulatory Visit | Attending: *Deleted | Admitting: *Deleted

## 2016-09-12 DIAGNOSIS — D509 Iron deficiency anemia, unspecified: Secondary | ICD-10-CM | POA: Diagnosis not present

## 2016-09-12 DIAGNOSIS — N2581 Secondary hyperparathyroidism of renal origin: Secondary | ICD-10-CM | POA: Diagnosis not present

## 2016-09-12 DIAGNOSIS — L299 Pruritus, unspecified: Secondary | ICD-10-CM | POA: Diagnosis not present

## 2016-09-12 DIAGNOSIS — D631 Anemia in chronic kidney disease: Secondary | ICD-10-CM | POA: Diagnosis not present

## 2016-09-12 DIAGNOSIS — N186 End stage renal disease: Secondary | ICD-10-CM | POA: Insufficient documentation

## 2016-09-12 LAB — HEMOGLOBIN: HEMOGLOBIN: 11.7 g/dL — AB (ref 12.0–16.0)

## 2016-09-12 LAB — POTASSIUM: POTASSIUM: 4.7 mmol/L (ref 3.5–5.1)

## 2016-09-15 DIAGNOSIS — N2581 Secondary hyperparathyroidism of renal origin: Secondary | ICD-10-CM | POA: Diagnosis not present

## 2016-09-15 DIAGNOSIS — D631 Anemia in chronic kidney disease: Secondary | ICD-10-CM | POA: Diagnosis not present

## 2016-09-15 DIAGNOSIS — D509 Iron deficiency anemia, unspecified: Secondary | ICD-10-CM | POA: Diagnosis not present

## 2016-09-15 DIAGNOSIS — N186 End stage renal disease: Secondary | ICD-10-CM | POA: Diagnosis not present

## 2016-09-15 DIAGNOSIS — L299 Pruritus, unspecified: Secondary | ICD-10-CM | POA: Diagnosis not present

## 2016-09-17 DIAGNOSIS — N2581 Secondary hyperparathyroidism of renal origin: Secondary | ICD-10-CM | POA: Diagnosis not present

## 2016-09-17 DIAGNOSIS — L299 Pruritus, unspecified: Secondary | ICD-10-CM | POA: Diagnosis not present

## 2016-09-17 DIAGNOSIS — D509 Iron deficiency anemia, unspecified: Secondary | ICD-10-CM | POA: Diagnosis not present

## 2016-09-17 DIAGNOSIS — N186 End stage renal disease: Secondary | ICD-10-CM | POA: Diagnosis not present

## 2016-09-17 DIAGNOSIS — D631 Anemia in chronic kidney disease: Secondary | ICD-10-CM | POA: Diagnosis not present

## 2016-09-19 DIAGNOSIS — L299 Pruritus, unspecified: Secondary | ICD-10-CM | POA: Diagnosis not present

## 2016-09-19 DIAGNOSIS — D631 Anemia in chronic kidney disease: Secondary | ICD-10-CM | POA: Diagnosis not present

## 2016-09-19 DIAGNOSIS — D509 Iron deficiency anemia, unspecified: Secondary | ICD-10-CM | POA: Diagnosis not present

## 2016-09-19 DIAGNOSIS — N186 End stage renal disease: Secondary | ICD-10-CM | POA: Diagnosis not present

## 2016-09-19 DIAGNOSIS — N2581 Secondary hyperparathyroidism of renal origin: Secondary | ICD-10-CM | POA: Diagnosis not present

## 2016-09-22 DIAGNOSIS — D631 Anemia in chronic kidney disease: Secondary | ICD-10-CM | POA: Diagnosis not present

## 2016-09-22 DIAGNOSIS — N186 End stage renal disease: Secondary | ICD-10-CM | POA: Diagnosis not present

## 2016-09-22 DIAGNOSIS — N2581 Secondary hyperparathyroidism of renal origin: Secondary | ICD-10-CM | POA: Diagnosis not present

## 2016-09-22 DIAGNOSIS — D509 Iron deficiency anemia, unspecified: Secondary | ICD-10-CM | POA: Diagnosis not present

## 2016-09-22 DIAGNOSIS — L299 Pruritus, unspecified: Secondary | ICD-10-CM | POA: Diagnosis not present

## 2016-09-24 DIAGNOSIS — L299 Pruritus, unspecified: Secondary | ICD-10-CM | POA: Diagnosis not present

## 2016-09-24 DIAGNOSIS — D631 Anemia in chronic kidney disease: Secondary | ICD-10-CM | POA: Diagnosis not present

## 2016-09-24 DIAGNOSIS — N2581 Secondary hyperparathyroidism of renal origin: Secondary | ICD-10-CM | POA: Diagnosis not present

## 2016-09-24 DIAGNOSIS — D509 Iron deficiency anemia, unspecified: Secondary | ICD-10-CM | POA: Diagnosis not present

## 2016-09-24 DIAGNOSIS — N186 End stage renal disease: Secondary | ICD-10-CM | POA: Diagnosis not present

## 2016-09-26 DIAGNOSIS — D509 Iron deficiency anemia, unspecified: Secondary | ICD-10-CM | POA: Diagnosis not present

## 2016-09-26 DIAGNOSIS — L299 Pruritus, unspecified: Secondary | ICD-10-CM | POA: Diagnosis not present

## 2016-09-26 DIAGNOSIS — D631 Anemia in chronic kidney disease: Secondary | ICD-10-CM | POA: Diagnosis not present

## 2016-09-26 DIAGNOSIS — N186 End stage renal disease: Secondary | ICD-10-CM | POA: Diagnosis not present

## 2016-09-26 DIAGNOSIS — N2581 Secondary hyperparathyroidism of renal origin: Secondary | ICD-10-CM | POA: Diagnosis not present

## 2016-09-29 DIAGNOSIS — D631 Anemia in chronic kidney disease: Secondary | ICD-10-CM | POA: Diagnosis not present

## 2016-09-29 DIAGNOSIS — D509 Iron deficiency anemia, unspecified: Secondary | ICD-10-CM | POA: Diagnosis not present

## 2016-09-29 DIAGNOSIS — N186 End stage renal disease: Secondary | ICD-10-CM | POA: Diagnosis not present

## 2016-09-29 DIAGNOSIS — N2581 Secondary hyperparathyroidism of renal origin: Secondary | ICD-10-CM | POA: Diagnosis not present

## 2016-09-29 DIAGNOSIS — L299 Pruritus, unspecified: Secondary | ICD-10-CM | POA: Diagnosis not present

## 2016-10-01 DIAGNOSIS — D631 Anemia in chronic kidney disease: Secondary | ICD-10-CM | POA: Diagnosis not present

## 2016-10-01 DIAGNOSIS — I120 Hypertensive chronic kidney disease with stage 5 chronic kidney disease or end stage renal disease: Secondary | ICD-10-CM | POA: Diagnosis not present

## 2016-10-01 DIAGNOSIS — D696 Thrombocytopenia, unspecified: Secondary | ICD-10-CM | POA: Diagnosis not present

## 2016-10-01 DIAGNOSIS — N186 End stage renal disease: Secondary | ICD-10-CM | POA: Diagnosis not present

## 2016-10-01 DIAGNOSIS — D509 Iron deficiency anemia, unspecified: Secondary | ICD-10-CM | POA: Diagnosis not present

## 2016-10-01 DIAGNOSIS — R000 Tachycardia, unspecified: Secondary | ICD-10-CM | POA: Diagnosis not present

## 2016-10-01 DIAGNOSIS — Z87891 Personal history of nicotine dependence: Secondary | ICD-10-CM | POA: Diagnosis not present

## 2016-10-01 DIAGNOSIS — I471 Supraventricular tachycardia: Secondary | ICD-10-CM | POA: Diagnosis not present

## 2016-10-01 DIAGNOSIS — N2581 Secondary hyperparathyroidism of renal origin: Secondary | ICD-10-CM | POA: Diagnosis not present

## 2016-10-01 DIAGNOSIS — I499 Cardiac arrhythmia, unspecified: Secondary | ICD-10-CM | POA: Diagnosis not present

## 2016-10-01 DIAGNOSIS — R7981 Abnormal blood-gas level: Secondary | ICD-10-CM | POA: Diagnosis not present

## 2016-10-01 DIAGNOSIS — I517 Cardiomegaly: Secondary | ICD-10-CM | POA: Diagnosis not present

## 2016-10-01 DIAGNOSIS — E878 Other disorders of electrolyte and fluid balance, not elsewhere classified: Secondary | ICD-10-CM | POA: Diagnosis not present

## 2016-10-01 DIAGNOSIS — Z992 Dependence on renal dialysis: Secondary | ICD-10-CM | POA: Diagnosis not present

## 2016-10-01 DIAGNOSIS — L299 Pruritus, unspecified: Secondary | ICD-10-CM | POA: Diagnosis not present

## 2016-10-02 DIAGNOSIS — N186 End stage renal disease: Secondary | ICD-10-CM | POA: Diagnosis not present

## 2016-10-02 DIAGNOSIS — Z992 Dependence on renal dialysis: Secondary | ICD-10-CM | POA: Diagnosis not present

## 2016-10-03 DIAGNOSIS — D631 Anemia in chronic kidney disease: Secondary | ICD-10-CM | POA: Diagnosis not present

## 2016-10-03 DIAGNOSIS — N186 End stage renal disease: Secondary | ICD-10-CM | POA: Diagnosis not present

## 2016-10-03 DIAGNOSIS — N2581 Secondary hyperparathyroidism of renal origin: Secondary | ICD-10-CM | POA: Diagnosis not present

## 2016-10-03 DIAGNOSIS — D509 Iron deficiency anemia, unspecified: Secondary | ICD-10-CM | POA: Diagnosis not present

## 2016-10-06 DIAGNOSIS — D509 Iron deficiency anemia, unspecified: Secondary | ICD-10-CM | POA: Diagnosis not present

## 2016-10-06 DIAGNOSIS — D631 Anemia in chronic kidney disease: Secondary | ICD-10-CM | POA: Diagnosis not present

## 2016-10-06 DIAGNOSIS — N2581 Secondary hyperparathyroidism of renal origin: Secondary | ICD-10-CM | POA: Diagnosis not present

## 2016-10-06 DIAGNOSIS — N186 End stage renal disease: Secondary | ICD-10-CM | POA: Diagnosis not present

## 2016-10-08 DIAGNOSIS — N186 End stage renal disease: Secondary | ICD-10-CM | POA: Diagnosis not present

## 2016-10-08 DIAGNOSIS — D509 Iron deficiency anemia, unspecified: Secondary | ICD-10-CM | POA: Diagnosis not present

## 2016-10-08 DIAGNOSIS — N2581 Secondary hyperparathyroidism of renal origin: Secondary | ICD-10-CM | POA: Diagnosis not present

## 2016-10-08 DIAGNOSIS — D631 Anemia in chronic kidney disease: Secondary | ICD-10-CM | POA: Diagnosis not present

## 2016-10-09 DIAGNOSIS — D631 Anemia in chronic kidney disease: Secondary | ICD-10-CM | POA: Diagnosis not present

## 2016-10-09 DIAGNOSIS — D509 Iron deficiency anemia, unspecified: Secondary | ICD-10-CM | POA: Diagnosis not present

## 2016-10-09 DIAGNOSIS — N186 End stage renal disease: Secondary | ICD-10-CM | POA: Diagnosis not present

## 2016-10-09 DIAGNOSIS — N2581 Secondary hyperparathyroidism of renal origin: Secondary | ICD-10-CM | POA: Diagnosis not present

## 2016-10-13 DIAGNOSIS — D509 Iron deficiency anemia, unspecified: Secondary | ICD-10-CM | POA: Diagnosis not present

## 2016-10-13 DIAGNOSIS — N186 End stage renal disease: Secondary | ICD-10-CM | POA: Diagnosis not present

## 2016-10-13 DIAGNOSIS — N2581 Secondary hyperparathyroidism of renal origin: Secondary | ICD-10-CM | POA: Diagnosis not present

## 2016-10-13 DIAGNOSIS — D631 Anemia in chronic kidney disease: Secondary | ICD-10-CM | POA: Diagnosis not present

## 2016-10-14 DIAGNOSIS — D509 Iron deficiency anemia, unspecified: Secondary | ICD-10-CM | POA: Diagnosis not present

## 2016-10-14 DIAGNOSIS — N186 End stage renal disease: Secondary | ICD-10-CM | POA: Diagnosis not present

## 2016-10-14 DIAGNOSIS — D631 Anemia in chronic kidney disease: Secondary | ICD-10-CM | POA: Diagnosis not present

## 2016-10-14 DIAGNOSIS — N2581 Secondary hyperparathyroidism of renal origin: Secondary | ICD-10-CM | POA: Diagnosis not present

## 2016-10-17 DIAGNOSIS — D509 Iron deficiency anemia, unspecified: Secondary | ICD-10-CM | POA: Diagnosis not present

## 2016-10-17 DIAGNOSIS — N186 End stage renal disease: Secondary | ICD-10-CM | POA: Diagnosis not present

## 2016-10-17 DIAGNOSIS — D631 Anemia in chronic kidney disease: Secondary | ICD-10-CM | POA: Diagnosis not present

## 2016-10-17 DIAGNOSIS — N2581 Secondary hyperparathyroidism of renal origin: Secondary | ICD-10-CM | POA: Diagnosis not present

## 2016-10-20 DIAGNOSIS — N2581 Secondary hyperparathyroidism of renal origin: Secondary | ICD-10-CM | POA: Diagnosis not present

## 2016-10-20 DIAGNOSIS — D509 Iron deficiency anemia, unspecified: Secondary | ICD-10-CM | POA: Diagnosis not present

## 2016-10-20 DIAGNOSIS — D631 Anemia in chronic kidney disease: Secondary | ICD-10-CM | POA: Diagnosis not present

## 2016-10-20 DIAGNOSIS — N186 End stage renal disease: Secondary | ICD-10-CM | POA: Diagnosis not present

## 2016-10-22 DIAGNOSIS — D509 Iron deficiency anemia, unspecified: Secondary | ICD-10-CM | POA: Diagnosis not present

## 2016-10-22 DIAGNOSIS — N186 End stage renal disease: Secondary | ICD-10-CM | POA: Diagnosis not present

## 2016-10-22 DIAGNOSIS — N2581 Secondary hyperparathyroidism of renal origin: Secondary | ICD-10-CM | POA: Diagnosis not present

## 2016-10-22 DIAGNOSIS — D631 Anemia in chronic kidney disease: Secondary | ICD-10-CM | POA: Diagnosis not present

## 2016-10-24 DIAGNOSIS — D631 Anemia in chronic kidney disease: Secondary | ICD-10-CM | POA: Diagnosis not present

## 2016-10-24 DIAGNOSIS — D509 Iron deficiency anemia, unspecified: Secondary | ICD-10-CM | POA: Diagnosis not present

## 2016-10-24 DIAGNOSIS — N186 End stage renal disease: Secondary | ICD-10-CM | POA: Diagnosis not present

## 2016-10-24 DIAGNOSIS — N2581 Secondary hyperparathyroidism of renal origin: Secondary | ICD-10-CM | POA: Diagnosis not present

## 2016-10-27 DIAGNOSIS — D631 Anemia in chronic kidney disease: Secondary | ICD-10-CM | POA: Diagnosis not present

## 2016-10-27 DIAGNOSIS — N2581 Secondary hyperparathyroidism of renal origin: Secondary | ICD-10-CM | POA: Diagnosis not present

## 2016-10-27 DIAGNOSIS — D509 Iron deficiency anemia, unspecified: Secondary | ICD-10-CM | POA: Diagnosis not present

## 2016-10-27 DIAGNOSIS — N186 End stage renal disease: Secondary | ICD-10-CM | POA: Diagnosis not present

## 2016-10-29 DIAGNOSIS — N2581 Secondary hyperparathyroidism of renal origin: Secondary | ICD-10-CM | POA: Diagnosis not present

## 2016-10-29 DIAGNOSIS — D631 Anemia in chronic kidney disease: Secondary | ICD-10-CM | POA: Diagnosis not present

## 2016-10-29 DIAGNOSIS — D509 Iron deficiency anemia, unspecified: Secondary | ICD-10-CM | POA: Diagnosis not present

## 2016-10-29 DIAGNOSIS — N186 End stage renal disease: Secondary | ICD-10-CM | POA: Diagnosis not present

## 2016-10-31 DIAGNOSIS — D631 Anemia in chronic kidney disease: Secondary | ICD-10-CM | POA: Diagnosis not present

## 2016-10-31 DIAGNOSIS — N186 End stage renal disease: Secondary | ICD-10-CM | POA: Diagnosis not present

## 2016-10-31 DIAGNOSIS — N2581 Secondary hyperparathyroidism of renal origin: Secondary | ICD-10-CM | POA: Diagnosis not present

## 2016-10-31 DIAGNOSIS — D509 Iron deficiency anemia, unspecified: Secondary | ICD-10-CM | POA: Diagnosis not present

## 2016-11-01 DIAGNOSIS — Z992 Dependence on renal dialysis: Secondary | ICD-10-CM | POA: Diagnosis not present

## 2016-11-01 DIAGNOSIS — N186 End stage renal disease: Secondary | ICD-10-CM | POA: Diagnosis not present

## 2016-11-03 DIAGNOSIS — N2581 Secondary hyperparathyroidism of renal origin: Secondary | ICD-10-CM | POA: Diagnosis not present

## 2016-11-03 DIAGNOSIS — D509 Iron deficiency anemia, unspecified: Secondary | ICD-10-CM | POA: Diagnosis not present

## 2016-11-03 DIAGNOSIS — Z23 Encounter for immunization: Secondary | ICD-10-CM | POA: Diagnosis not present

## 2016-11-03 DIAGNOSIS — E8770 Fluid overload, unspecified: Secondary | ICD-10-CM | POA: Diagnosis not present

## 2016-11-03 DIAGNOSIS — N186 End stage renal disease: Secondary | ICD-10-CM | POA: Diagnosis not present

## 2016-11-03 DIAGNOSIS — D631 Anemia in chronic kidney disease: Secondary | ICD-10-CM | POA: Diagnosis not present

## 2016-11-03 DIAGNOSIS — R509 Fever, unspecified: Secondary | ICD-10-CM | POA: Diagnosis not present

## 2016-11-03 DIAGNOSIS — L299 Pruritus, unspecified: Secondary | ICD-10-CM | POA: Diagnosis not present

## 2016-11-05 DIAGNOSIS — L299 Pruritus, unspecified: Secondary | ICD-10-CM | POA: Diagnosis not present

## 2016-11-05 DIAGNOSIS — R509 Fever, unspecified: Secondary | ICD-10-CM | POA: Diagnosis not present

## 2016-11-05 DIAGNOSIS — N186 End stage renal disease: Secondary | ICD-10-CM | POA: Diagnosis not present

## 2016-11-05 DIAGNOSIS — D509 Iron deficiency anemia, unspecified: Secondary | ICD-10-CM | POA: Diagnosis not present

## 2016-11-05 DIAGNOSIS — Z23 Encounter for immunization: Secondary | ICD-10-CM | POA: Diagnosis not present

## 2016-11-05 DIAGNOSIS — D631 Anemia in chronic kidney disease: Secondary | ICD-10-CM | POA: Diagnosis not present

## 2016-11-07 DIAGNOSIS — Z23 Encounter for immunization: Secondary | ICD-10-CM | POA: Diagnosis not present

## 2016-11-07 DIAGNOSIS — N186 End stage renal disease: Secondary | ICD-10-CM | POA: Diagnosis not present

## 2016-11-07 DIAGNOSIS — D509 Iron deficiency anemia, unspecified: Secondary | ICD-10-CM | POA: Diagnosis not present

## 2016-11-07 DIAGNOSIS — R509 Fever, unspecified: Secondary | ICD-10-CM | POA: Diagnosis not present

## 2016-11-07 DIAGNOSIS — D631 Anemia in chronic kidney disease: Secondary | ICD-10-CM | POA: Diagnosis not present

## 2016-11-07 DIAGNOSIS — L299 Pruritus, unspecified: Secondary | ICD-10-CM | POA: Diagnosis not present

## 2016-11-09 DIAGNOSIS — D509 Iron deficiency anemia, unspecified: Secondary | ICD-10-CM | POA: Diagnosis not present

## 2016-11-09 DIAGNOSIS — Z23 Encounter for immunization: Secondary | ICD-10-CM | POA: Diagnosis not present

## 2016-11-09 DIAGNOSIS — D631 Anemia in chronic kidney disease: Secondary | ICD-10-CM | POA: Diagnosis not present

## 2016-11-09 DIAGNOSIS — R509 Fever, unspecified: Secondary | ICD-10-CM | POA: Diagnosis not present

## 2016-11-09 DIAGNOSIS — N186 End stage renal disease: Secondary | ICD-10-CM | POA: Diagnosis not present

## 2016-11-09 DIAGNOSIS — L299 Pruritus, unspecified: Secondary | ICD-10-CM | POA: Diagnosis not present

## 2016-11-10 DIAGNOSIS — R509 Fever, unspecified: Secondary | ICD-10-CM | POA: Diagnosis not present

## 2016-11-10 DIAGNOSIS — D509 Iron deficiency anemia, unspecified: Secondary | ICD-10-CM | POA: Diagnosis not present

## 2016-11-10 DIAGNOSIS — D631 Anemia in chronic kidney disease: Secondary | ICD-10-CM | POA: Diagnosis not present

## 2016-11-10 DIAGNOSIS — L299 Pruritus, unspecified: Secondary | ICD-10-CM | POA: Diagnosis not present

## 2016-11-10 DIAGNOSIS — Z23 Encounter for immunization: Secondary | ICD-10-CM | POA: Diagnosis not present

## 2016-11-10 DIAGNOSIS — N186 End stage renal disease: Secondary | ICD-10-CM | POA: Diagnosis not present

## 2016-11-12 DIAGNOSIS — N186 End stage renal disease: Secondary | ICD-10-CM | POA: Diagnosis not present

## 2016-11-12 DIAGNOSIS — R509 Fever, unspecified: Secondary | ICD-10-CM | POA: Diagnosis not present

## 2016-11-12 DIAGNOSIS — D509 Iron deficiency anemia, unspecified: Secondary | ICD-10-CM | POA: Diagnosis not present

## 2016-11-12 DIAGNOSIS — L299 Pruritus, unspecified: Secondary | ICD-10-CM | POA: Diagnosis not present

## 2016-11-12 DIAGNOSIS — Z23 Encounter for immunization: Secondary | ICD-10-CM | POA: Diagnosis not present

## 2016-11-12 DIAGNOSIS — D631 Anemia in chronic kidney disease: Secondary | ICD-10-CM | POA: Diagnosis not present

## 2016-11-14 DIAGNOSIS — D631 Anemia in chronic kidney disease: Secondary | ICD-10-CM | POA: Diagnosis not present

## 2016-11-14 DIAGNOSIS — D509 Iron deficiency anemia, unspecified: Secondary | ICD-10-CM | POA: Diagnosis not present

## 2016-11-14 DIAGNOSIS — N186 End stage renal disease: Secondary | ICD-10-CM | POA: Diagnosis not present

## 2016-11-14 DIAGNOSIS — R509 Fever, unspecified: Secondary | ICD-10-CM | POA: Diagnosis not present

## 2016-11-14 DIAGNOSIS — L299 Pruritus, unspecified: Secondary | ICD-10-CM | POA: Diagnosis not present

## 2016-11-14 DIAGNOSIS — Z23 Encounter for immunization: Secondary | ICD-10-CM | POA: Diagnosis not present

## 2016-11-17 DIAGNOSIS — L299 Pruritus, unspecified: Secondary | ICD-10-CM | POA: Diagnosis not present

## 2016-11-17 DIAGNOSIS — R509 Fever, unspecified: Secondary | ICD-10-CM | POA: Diagnosis not present

## 2016-11-17 DIAGNOSIS — N186 End stage renal disease: Secondary | ICD-10-CM | POA: Diagnosis not present

## 2016-11-17 DIAGNOSIS — D509 Iron deficiency anemia, unspecified: Secondary | ICD-10-CM | POA: Diagnosis not present

## 2016-11-17 DIAGNOSIS — D631 Anemia in chronic kidney disease: Secondary | ICD-10-CM | POA: Diagnosis not present

## 2016-11-17 DIAGNOSIS — Z23 Encounter for immunization: Secondary | ICD-10-CM | POA: Diagnosis not present

## 2016-11-19 DIAGNOSIS — Z23 Encounter for immunization: Secondary | ICD-10-CM | POA: Diagnosis not present

## 2016-11-19 DIAGNOSIS — L299 Pruritus, unspecified: Secondary | ICD-10-CM | POA: Diagnosis not present

## 2016-11-19 DIAGNOSIS — D509 Iron deficiency anemia, unspecified: Secondary | ICD-10-CM | POA: Diagnosis not present

## 2016-11-19 DIAGNOSIS — R509 Fever, unspecified: Secondary | ICD-10-CM | POA: Diagnosis not present

## 2016-11-19 DIAGNOSIS — D631 Anemia in chronic kidney disease: Secondary | ICD-10-CM | POA: Diagnosis not present

## 2016-11-19 DIAGNOSIS — N186 End stage renal disease: Secondary | ICD-10-CM | POA: Diagnosis not present

## 2016-11-21 DIAGNOSIS — L299 Pruritus, unspecified: Secondary | ICD-10-CM | POA: Diagnosis not present

## 2016-11-21 DIAGNOSIS — N186 End stage renal disease: Secondary | ICD-10-CM | POA: Diagnosis not present

## 2016-11-21 DIAGNOSIS — Z23 Encounter for immunization: Secondary | ICD-10-CM | POA: Diagnosis not present

## 2016-11-21 DIAGNOSIS — R509 Fever, unspecified: Secondary | ICD-10-CM | POA: Diagnosis not present

## 2016-11-21 DIAGNOSIS — D631 Anemia in chronic kidney disease: Secondary | ICD-10-CM | POA: Diagnosis not present

## 2016-11-21 DIAGNOSIS — D509 Iron deficiency anemia, unspecified: Secondary | ICD-10-CM | POA: Diagnosis not present

## 2016-11-24 DIAGNOSIS — D509 Iron deficiency anemia, unspecified: Secondary | ICD-10-CM | POA: Diagnosis not present

## 2016-11-24 DIAGNOSIS — Z23 Encounter for immunization: Secondary | ICD-10-CM | POA: Diagnosis not present

## 2016-11-24 DIAGNOSIS — D631 Anemia in chronic kidney disease: Secondary | ICD-10-CM | POA: Diagnosis not present

## 2016-11-24 DIAGNOSIS — L299 Pruritus, unspecified: Secondary | ICD-10-CM | POA: Diagnosis not present

## 2016-11-24 DIAGNOSIS — N186 End stage renal disease: Secondary | ICD-10-CM | POA: Diagnosis not present

## 2016-11-24 DIAGNOSIS — R509 Fever, unspecified: Secondary | ICD-10-CM | POA: Diagnosis not present

## 2016-11-26 DIAGNOSIS — D631 Anemia in chronic kidney disease: Secondary | ICD-10-CM | POA: Diagnosis not present

## 2016-11-26 DIAGNOSIS — L299 Pruritus, unspecified: Secondary | ICD-10-CM | POA: Diagnosis not present

## 2016-11-26 DIAGNOSIS — N186 End stage renal disease: Secondary | ICD-10-CM | POA: Diagnosis not present

## 2016-11-26 DIAGNOSIS — D509 Iron deficiency anemia, unspecified: Secondary | ICD-10-CM | POA: Diagnosis not present

## 2016-11-26 DIAGNOSIS — Z23 Encounter for immunization: Secondary | ICD-10-CM | POA: Diagnosis not present

## 2016-11-26 DIAGNOSIS — R509 Fever, unspecified: Secondary | ICD-10-CM | POA: Diagnosis not present

## 2016-11-28 DIAGNOSIS — D509 Iron deficiency anemia, unspecified: Secondary | ICD-10-CM | POA: Diagnosis not present

## 2016-11-28 DIAGNOSIS — D631 Anemia in chronic kidney disease: Secondary | ICD-10-CM | POA: Diagnosis not present

## 2016-11-28 DIAGNOSIS — Z23 Encounter for immunization: Secondary | ICD-10-CM | POA: Diagnosis not present

## 2016-11-28 DIAGNOSIS — R509 Fever, unspecified: Secondary | ICD-10-CM | POA: Diagnosis not present

## 2016-11-28 DIAGNOSIS — N186 End stage renal disease: Secondary | ICD-10-CM | POA: Diagnosis not present

## 2016-11-28 DIAGNOSIS — L299 Pruritus, unspecified: Secondary | ICD-10-CM | POA: Diagnosis not present

## 2016-12-01 DIAGNOSIS — D509 Iron deficiency anemia, unspecified: Secondary | ICD-10-CM | POA: Diagnosis not present

## 2016-12-01 DIAGNOSIS — R509 Fever, unspecified: Secondary | ICD-10-CM | POA: Diagnosis not present

## 2016-12-01 DIAGNOSIS — D631 Anemia in chronic kidney disease: Secondary | ICD-10-CM | POA: Diagnosis not present

## 2016-12-01 DIAGNOSIS — N186 End stage renal disease: Secondary | ICD-10-CM | POA: Diagnosis not present

## 2016-12-01 DIAGNOSIS — L299 Pruritus, unspecified: Secondary | ICD-10-CM | POA: Diagnosis not present

## 2016-12-01 DIAGNOSIS — Z23 Encounter for immunization: Secondary | ICD-10-CM | POA: Diagnosis not present

## 2016-12-02 DIAGNOSIS — Z992 Dependence on renal dialysis: Secondary | ICD-10-CM | POA: Diagnosis not present

## 2016-12-02 DIAGNOSIS — N186 End stage renal disease: Secondary | ICD-10-CM | POA: Diagnosis not present

## 2016-12-03 DIAGNOSIS — N186 End stage renal disease: Secondary | ICD-10-CM | POA: Diagnosis not present

## 2016-12-03 DIAGNOSIS — N2581 Secondary hyperparathyroidism of renal origin: Secondary | ICD-10-CM | POA: Diagnosis not present

## 2016-12-03 DIAGNOSIS — D509 Iron deficiency anemia, unspecified: Secondary | ICD-10-CM | POA: Diagnosis not present

## 2016-12-03 DIAGNOSIS — D631 Anemia in chronic kidney disease: Secondary | ICD-10-CM | POA: Diagnosis not present

## 2016-12-05 DIAGNOSIS — D631 Anemia in chronic kidney disease: Secondary | ICD-10-CM | POA: Diagnosis not present

## 2016-12-05 DIAGNOSIS — N2581 Secondary hyperparathyroidism of renal origin: Secondary | ICD-10-CM | POA: Diagnosis not present

## 2016-12-05 DIAGNOSIS — D509 Iron deficiency anemia, unspecified: Secondary | ICD-10-CM | POA: Diagnosis not present

## 2016-12-05 DIAGNOSIS — N186 End stage renal disease: Secondary | ICD-10-CM | POA: Diagnosis not present

## 2016-12-08 DIAGNOSIS — D631 Anemia in chronic kidney disease: Secondary | ICD-10-CM | POA: Diagnosis not present

## 2016-12-08 DIAGNOSIS — N186 End stage renal disease: Secondary | ICD-10-CM | POA: Diagnosis not present

## 2016-12-08 DIAGNOSIS — N2581 Secondary hyperparathyroidism of renal origin: Secondary | ICD-10-CM | POA: Diagnosis not present

## 2016-12-08 DIAGNOSIS — D509 Iron deficiency anemia, unspecified: Secondary | ICD-10-CM | POA: Diagnosis not present

## 2016-12-10 DIAGNOSIS — N186 End stage renal disease: Secondary | ICD-10-CM | POA: Diagnosis not present

## 2016-12-10 DIAGNOSIS — N2581 Secondary hyperparathyroidism of renal origin: Secondary | ICD-10-CM | POA: Diagnosis not present

## 2016-12-10 DIAGNOSIS — D631 Anemia in chronic kidney disease: Secondary | ICD-10-CM | POA: Diagnosis not present

## 2016-12-10 DIAGNOSIS — D509 Iron deficiency anemia, unspecified: Secondary | ICD-10-CM | POA: Diagnosis not present

## 2016-12-12 DIAGNOSIS — D509 Iron deficiency anemia, unspecified: Secondary | ICD-10-CM | POA: Diagnosis not present

## 2016-12-12 DIAGNOSIS — N2581 Secondary hyperparathyroidism of renal origin: Secondary | ICD-10-CM | POA: Diagnosis not present

## 2016-12-12 DIAGNOSIS — D631 Anemia in chronic kidney disease: Secondary | ICD-10-CM | POA: Diagnosis not present

## 2016-12-12 DIAGNOSIS — N186 End stage renal disease: Secondary | ICD-10-CM | POA: Diagnosis not present

## 2016-12-15 DIAGNOSIS — N2581 Secondary hyperparathyroidism of renal origin: Secondary | ICD-10-CM | POA: Diagnosis not present

## 2016-12-15 DIAGNOSIS — D509 Iron deficiency anemia, unspecified: Secondary | ICD-10-CM | POA: Diagnosis not present

## 2016-12-15 DIAGNOSIS — N186 End stage renal disease: Secondary | ICD-10-CM | POA: Diagnosis not present

## 2016-12-15 DIAGNOSIS — D631 Anemia in chronic kidney disease: Secondary | ICD-10-CM | POA: Diagnosis not present

## 2016-12-17 DIAGNOSIS — N2581 Secondary hyperparathyroidism of renal origin: Secondary | ICD-10-CM | POA: Diagnosis not present

## 2016-12-17 DIAGNOSIS — D631 Anemia in chronic kidney disease: Secondary | ICD-10-CM | POA: Diagnosis not present

## 2016-12-17 DIAGNOSIS — N186 End stage renal disease: Secondary | ICD-10-CM | POA: Diagnosis not present

## 2016-12-17 DIAGNOSIS — D509 Iron deficiency anemia, unspecified: Secondary | ICD-10-CM | POA: Diagnosis not present

## 2016-12-19 DIAGNOSIS — D509 Iron deficiency anemia, unspecified: Secondary | ICD-10-CM | POA: Diagnosis not present

## 2016-12-19 DIAGNOSIS — N2581 Secondary hyperparathyroidism of renal origin: Secondary | ICD-10-CM | POA: Diagnosis not present

## 2016-12-19 DIAGNOSIS — D631 Anemia in chronic kidney disease: Secondary | ICD-10-CM | POA: Diagnosis not present

## 2016-12-19 DIAGNOSIS — N186 End stage renal disease: Secondary | ICD-10-CM | POA: Diagnosis not present

## 2016-12-22 DIAGNOSIS — N186 End stage renal disease: Secondary | ICD-10-CM | POA: Diagnosis not present

## 2016-12-22 DIAGNOSIS — N2581 Secondary hyperparathyroidism of renal origin: Secondary | ICD-10-CM | POA: Diagnosis not present

## 2016-12-22 DIAGNOSIS — D509 Iron deficiency anemia, unspecified: Secondary | ICD-10-CM | POA: Diagnosis not present

## 2016-12-22 DIAGNOSIS — D631 Anemia in chronic kidney disease: Secondary | ICD-10-CM | POA: Diagnosis not present

## 2016-12-24 DIAGNOSIS — N186 End stage renal disease: Secondary | ICD-10-CM | POA: Diagnosis not present

## 2016-12-24 DIAGNOSIS — D631 Anemia in chronic kidney disease: Secondary | ICD-10-CM | POA: Diagnosis not present

## 2016-12-24 DIAGNOSIS — D509 Iron deficiency anemia, unspecified: Secondary | ICD-10-CM | POA: Diagnosis not present

## 2016-12-24 DIAGNOSIS — N2581 Secondary hyperparathyroidism of renal origin: Secondary | ICD-10-CM | POA: Diagnosis not present

## 2016-12-26 DIAGNOSIS — D509 Iron deficiency anemia, unspecified: Secondary | ICD-10-CM | POA: Diagnosis not present

## 2016-12-26 DIAGNOSIS — N186 End stage renal disease: Secondary | ICD-10-CM | POA: Diagnosis not present

## 2016-12-26 DIAGNOSIS — N2581 Secondary hyperparathyroidism of renal origin: Secondary | ICD-10-CM | POA: Diagnosis not present

## 2016-12-26 DIAGNOSIS — D631 Anemia in chronic kidney disease: Secondary | ICD-10-CM | POA: Diagnosis not present

## 2016-12-27 DIAGNOSIS — R0602 Shortness of breath: Secondary | ICD-10-CM | POA: Diagnosis not present

## 2016-12-29 DIAGNOSIS — D631 Anemia in chronic kidney disease: Secondary | ICD-10-CM | POA: Diagnosis not present

## 2016-12-29 DIAGNOSIS — D509 Iron deficiency anemia, unspecified: Secondary | ICD-10-CM | POA: Diagnosis not present

## 2016-12-29 DIAGNOSIS — N2581 Secondary hyperparathyroidism of renal origin: Secondary | ICD-10-CM | POA: Diagnosis not present

## 2016-12-29 DIAGNOSIS — N186 End stage renal disease: Secondary | ICD-10-CM | POA: Diagnosis not present

## 2016-12-31 DIAGNOSIS — D509 Iron deficiency anemia, unspecified: Secondary | ICD-10-CM | POA: Diagnosis not present

## 2016-12-31 DIAGNOSIS — D631 Anemia in chronic kidney disease: Secondary | ICD-10-CM | POA: Diagnosis not present

## 2016-12-31 DIAGNOSIS — N186 End stage renal disease: Secondary | ICD-10-CM | POA: Diagnosis not present

## 2016-12-31 DIAGNOSIS — N2581 Secondary hyperparathyroidism of renal origin: Secondary | ICD-10-CM | POA: Diagnosis not present

## 2017-01-01 DIAGNOSIS — N186 End stage renal disease: Secondary | ICD-10-CM | POA: Diagnosis not present

## 2017-01-01 DIAGNOSIS — Z992 Dependence on renal dialysis: Secondary | ICD-10-CM | POA: Diagnosis not present

## 2017-01-02 DIAGNOSIS — D509 Iron deficiency anemia, unspecified: Secondary | ICD-10-CM | POA: Diagnosis not present

## 2017-01-02 DIAGNOSIS — D631 Anemia in chronic kidney disease: Secondary | ICD-10-CM | POA: Diagnosis not present

## 2017-01-02 DIAGNOSIS — N2581 Secondary hyperparathyroidism of renal origin: Secondary | ICD-10-CM | POA: Diagnosis not present

## 2017-01-02 DIAGNOSIS — N186 End stage renal disease: Secondary | ICD-10-CM | POA: Diagnosis not present

## 2017-01-04 DIAGNOSIS — Z992 Dependence on renal dialysis: Secondary | ICD-10-CM | POA: Diagnosis not present

## 2017-01-04 DIAGNOSIS — G9341 Metabolic encephalopathy: Secondary | ICD-10-CM | POA: Diagnosis not present

## 2017-01-04 DIAGNOSIS — R0602 Shortness of breath: Secondary | ICD-10-CM | POA: Diagnosis not present

## 2017-01-04 DIAGNOSIS — I313 Pericardial effusion (noninflammatory): Secondary | ICD-10-CM | POA: Diagnosis not present

## 2017-01-04 DIAGNOSIS — I2721 Secondary pulmonary arterial hypertension: Secondary | ICD-10-CM | POA: Diagnosis not present

## 2017-01-04 DIAGNOSIS — J90 Pleural effusion, not elsewhere classified: Secondary | ICD-10-CM | POA: Diagnosis not present

## 2017-01-04 DIAGNOSIS — J9621 Acute and chronic respiratory failure with hypoxia: Secondary | ICD-10-CM | POA: Diagnosis not present

## 2017-01-04 DIAGNOSIS — R918 Other nonspecific abnormal finding of lung field: Secondary | ICD-10-CM | POA: Diagnosis not present

## 2017-01-04 DIAGNOSIS — I120 Hypertensive chronic kidney disease with stage 5 chronic kidney disease or end stage renal disease: Secondary | ICD-10-CM | POA: Diagnosis not present

## 2017-01-04 DIAGNOSIS — J8489 Other specified interstitial pulmonary diseases: Secondary | ICD-10-CM | POA: Diagnosis not present

## 2017-01-04 DIAGNOSIS — N186 End stage renal disease: Secondary | ICD-10-CM | POA: Diagnosis not present

## 2017-01-04 DIAGNOSIS — J9622 Acute and chronic respiratory failure with hypercapnia: Secondary | ICD-10-CM | POA: Diagnosis not present

## 2017-01-04 DIAGNOSIS — E8770 Fluid overload, unspecified: Secondary | ICD-10-CM | POA: Diagnosis not present

## 2017-01-04 DIAGNOSIS — R0902 Hypoxemia: Secondary | ICD-10-CM | POA: Diagnosis not present

## 2017-01-04 DIAGNOSIS — R079 Chest pain, unspecified: Secondary | ICD-10-CM | POA: Diagnosis not present

## 2017-01-04 DIAGNOSIS — I517 Cardiomegaly: Secondary | ICD-10-CM | POA: Diagnosis not present

## 2017-01-04 DIAGNOSIS — J189 Pneumonia, unspecified organism: Secondary | ICD-10-CM | POA: Diagnosis not present

## 2017-01-04 DIAGNOSIS — R0789 Other chest pain: Secondary | ICD-10-CM | POA: Diagnosis not present

## 2017-01-05 DIAGNOSIS — I2721 Secondary pulmonary arterial hypertension: Secondary | ICD-10-CM | POA: Diagnosis not present

## 2017-01-05 DIAGNOSIS — Z9119 Patient's noncompliance with other medical treatment and regimen: Secondary | ICD-10-CM | POA: Diagnosis not present

## 2017-01-05 DIAGNOSIS — N186 End stage renal disease: Secondary | ICD-10-CM | POA: Diagnosis not present

## 2017-01-05 DIAGNOSIS — R4689 Other symptoms and signs involving appearance and behavior: Secondary | ICD-10-CM | POA: Diagnosis not present

## 2017-01-05 DIAGNOSIS — J9612 Chronic respiratory failure with hypercapnia: Secondary | ICD-10-CM | POA: Diagnosis not present

## 2017-01-05 DIAGNOSIS — R0602 Shortness of breath: Secondary | ICD-10-CM | POA: Diagnosis not present

## 2017-01-05 DIAGNOSIS — Z992 Dependence on renal dialysis: Secondary | ICD-10-CM | POA: Diagnosis not present

## 2017-01-05 DIAGNOSIS — J984 Other disorders of lung: Secondary | ICD-10-CM | POA: Diagnosis not present

## 2017-01-05 DIAGNOSIS — J9611 Chronic respiratory failure with hypoxia: Secondary | ICD-10-CM | POA: Diagnosis not present

## 2017-01-05 DIAGNOSIS — J9622 Acute and chronic respiratory failure with hypercapnia: Secondary | ICD-10-CM | POA: Diagnosis not present

## 2017-01-05 DIAGNOSIS — N250 Renal osteodystrophy: Secondary | ICD-10-CM | POA: Diagnosis present

## 2017-01-05 DIAGNOSIS — Z9981 Dependence on supplemental oxygen: Secondary | ICD-10-CM | POA: Diagnosis not present

## 2017-01-05 DIAGNOSIS — G9341 Metabolic encephalopathy: Secondary | ICD-10-CM | POA: Diagnosis present

## 2017-01-05 DIAGNOSIS — T380X5A Adverse effect of glucocorticoids and synthetic analogues, initial encounter: Secondary | ICD-10-CM | POA: Diagnosis present

## 2017-01-05 DIAGNOSIS — J811 Chronic pulmonary edema: Secondary | ICD-10-CM | POA: Diagnosis present

## 2017-01-05 DIAGNOSIS — M87152 Osteonecrosis due to drugs, left femur: Secondary | ICD-10-CM | POA: Diagnosis present

## 2017-01-05 DIAGNOSIS — J9811 Atelectasis: Secondary | ICD-10-CM | POA: Diagnosis present

## 2017-01-05 DIAGNOSIS — E8770 Fluid overload, unspecified: Secondary | ICD-10-CM | POA: Diagnosis not present

## 2017-01-05 DIAGNOSIS — M419 Scoliosis, unspecified: Secondary | ICD-10-CM | POA: Diagnosis present

## 2017-01-05 DIAGNOSIS — I120 Hypertensive chronic kidney disease with stage 5 chronic kidney disease or end stage renal disease: Secondary | ICD-10-CM | POA: Diagnosis present

## 2017-01-05 DIAGNOSIS — D631 Anemia in chronic kidney disease: Secondary | ICD-10-CM | POA: Diagnosis present

## 2017-01-05 DIAGNOSIS — I517 Cardiomegaly: Secondary | ICD-10-CM | POA: Diagnosis not present

## 2017-01-05 DIAGNOSIS — J8489 Other specified interstitial pulmonary diseases: Secondary | ICD-10-CM | POA: Diagnosis not present

## 2017-01-05 DIAGNOSIS — G43909 Migraine, unspecified, not intractable, without status migrainosus: Secondary | ICD-10-CM | POA: Diagnosis present

## 2017-01-05 DIAGNOSIS — T8612 Kidney transplant failure: Secondary | ICD-10-CM | POA: Diagnosis present

## 2017-01-05 DIAGNOSIS — R931 Abnormal findings on diagnostic imaging of heart and coronary circulation: Secondary | ICD-10-CM | POA: Diagnosis not present

## 2017-01-05 DIAGNOSIS — J90 Pleural effusion, not elsewhere classified: Secondary | ICD-10-CM | POA: Diagnosis not present

## 2017-01-05 DIAGNOSIS — J189 Pneumonia, unspecified organism: Secondary | ICD-10-CM | POA: Diagnosis present

## 2017-01-05 DIAGNOSIS — R918 Other nonspecific abnormal finding of lung field: Secondary | ICD-10-CM | POA: Diagnosis not present

## 2017-01-05 DIAGNOSIS — N2581 Secondary hyperparathyroidism of renal origin: Secondary | ICD-10-CM | POA: Diagnosis present

## 2017-01-05 DIAGNOSIS — Q211 Atrial septal defect: Secondary | ICD-10-CM | POA: Diagnosis not present

## 2017-01-05 DIAGNOSIS — I313 Pericardial effusion (noninflammatory): Secondary | ICD-10-CM | POA: Diagnosis not present

## 2017-01-05 DIAGNOSIS — I350 Nonrheumatic aortic (valve) stenosis: Secondary | ICD-10-CM | POA: Diagnosis present

## 2017-01-05 DIAGNOSIS — I361 Nonrheumatic tricuspid (valve) insufficiency: Secondary | ICD-10-CM | POA: Diagnosis not present

## 2017-01-05 DIAGNOSIS — R079 Chest pain, unspecified: Secondary | ICD-10-CM | POA: Diagnosis not present

## 2017-01-05 DIAGNOSIS — E872 Acidosis: Secondary | ICD-10-CM | POA: Diagnosis present

## 2017-01-05 DIAGNOSIS — J9621 Acute and chronic respiratory failure with hypoxia: Secondary | ICD-10-CM | POA: Diagnosis not present

## 2017-01-16 DIAGNOSIS — N186 End stage renal disease: Secondary | ICD-10-CM | POA: Diagnosis not present

## 2017-01-16 DIAGNOSIS — D509 Iron deficiency anemia, unspecified: Secondary | ICD-10-CM | POA: Diagnosis not present

## 2017-01-16 DIAGNOSIS — D631 Anemia in chronic kidney disease: Secondary | ICD-10-CM | POA: Diagnosis not present

## 2017-01-16 DIAGNOSIS — N2581 Secondary hyperparathyroidism of renal origin: Secondary | ICD-10-CM | POA: Diagnosis not present

## 2017-01-19 DIAGNOSIS — D509 Iron deficiency anemia, unspecified: Secondary | ICD-10-CM | POA: Diagnosis not present

## 2017-01-19 DIAGNOSIS — N2581 Secondary hyperparathyroidism of renal origin: Secondary | ICD-10-CM | POA: Diagnosis not present

## 2017-01-19 DIAGNOSIS — N186 End stage renal disease: Secondary | ICD-10-CM | POA: Diagnosis not present

## 2017-01-19 DIAGNOSIS — D631 Anemia in chronic kidney disease: Secondary | ICD-10-CM | POA: Diagnosis not present

## 2017-01-21 DIAGNOSIS — D631 Anemia in chronic kidney disease: Secondary | ICD-10-CM | POA: Diagnosis not present

## 2017-01-21 DIAGNOSIS — N186 End stage renal disease: Secondary | ICD-10-CM | POA: Diagnosis not present

## 2017-01-21 DIAGNOSIS — D509 Iron deficiency anemia, unspecified: Secondary | ICD-10-CM | POA: Diagnosis not present

## 2017-01-21 DIAGNOSIS — N2581 Secondary hyperparathyroidism of renal origin: Secondary | ICD-10-CM | POA: Diagnosis not present

## 2017-01-23 DIAGNOSIS — D509 Iron deficiency anemia, unspecified: Secondary | ICD-10-CM | POA: Diagnosis not present

## 2017-01-23 DIAGNOSIS — N2581 Secondary hyperparathyroidism of renal origin: Secondary | ICD-10-CM | POA: Diagnosis not present

## 2017-01-23 DIAGNOSIS — N186 End stage renal disease: Secondary | ICD-10-CM | POA: Diagnosis not present

## 2017-01-23 DIAGNOSIS — D631 Anemia in chronic kidney disease: Secondary | ICD-10-CM | POA: Diagnosis not present

## 2017-01-25 DIAGNOSIS — N2581 Secondary hyperparathyroidism of renal origin: Secondary | ICD-10-CM | POA: Diagnosis not present

## 2017-01-25 DIAGNOSIS — D631 Anemia in chronic kidney disease: Secondary | ICD-10-CM | POA: Diagnosis not present

## 2017-01-25 DIAGNOSIS — N186 End stage renal disease: Secondary | ICD-10-CM | POA: Diagnosis not present

## 2017-01-25 DIAGNOSIS — D509 Iron deficiency anemia, unspecified: Secondary | ICD-10-CM | POA: Diagnosis not present

## 2017-01-28 DIAGNOSIS — N2581 Secondary hyperparathyroidism of renal origin: Secondary | ICD-10-CM | POA: Diagnosis not present

## 2017-01-28 DIAGNOSIS — D631 Anemia in chronic kidney disease: Secondary | ICD-10-CM | POA: Diagnosis not present

## 2017-01-28 DIAGNOSIS — D509 Iron deficiency anemia, unspecified: Secondary | ICD-10-CM | POA: Diagnosis not present

## 2017-01-28 DIAGNOSIS — N186 End stage renal disease: Secondary | ICD-10-CM | POA: Diagnosis not present

## 2017-01-29 DIAGNOSIS — N2581 Secondary hyperparathyroidism of renal origin: Secondary | ICD-10-CM | POA: Diagnosis not present

## 2017-01-29 DIAGNOSIS — N186 End stage renal disease: Secondary | ICD-10-CM | POA: Diagnosis not present

## 2017-01-29 DIAGNOSIS — D509 Iron deficiency anemia, unspecified: Secondary | ICD-10-CM | POA: Diagnosis not present

## 2017-01-29 DIAGNOSIS — D631 Anemia in chronic kidney disease: Secondary | ICD-10-CM | POA: Diagnosis not present

## 2017-01-30 DIAGNOSIS — D631 Anemia in chronic kidney disease: Secondary | ICD-10-CM | POA: Diagnosis not present

## 2017-01-30 DIAGNOSIS — N186 End stage renal disease: Secondary | ICD-10-CM | POA: Diagnosis not present

## 2017-01-30 DIAGNOSIS — D509 Iron deficiency anemia, unspecified: Secondary | ICD-10-CM | POA: Diagnosis not present

## 2017-01-30 DIAGNOSIS — N2581 Secondary hyperparathyroidism of renal origin: Secondary | ICD-10-CM | POA: Diagnosis not present

## 2017-02-01 DIAGNOSIS — Z992 Dependence on renal dialysis: Secondary | ICD-10-CM | POA: Diagnosis not present

## 2017-02-01 DIAGNOSIS — N186 End stage renal disease: Secondary | ICD-10-CM | POA: Diagnosis not present

## 2017-02-02 DIAGNOSIS — N186 End stage renal disease: Secondary | ICD-10-CM | POA: Diagnosis not present

## 2017-02-02 DIAGNOSIS — N2581 Secondary hyperparathyroidism of renal origin: Secondary | ICD-10-CM | POA: Diagnosis not present

## 2017-02-02 DIAGNOSIS — D631 Anemia in chronic kidney disease: Secondary | ICD-10-CM | POA: Diagnosis not present

## 2017-02-02 DIAGNOSIS — D509 Iron deficiency anemia, unspecified: Secondary | ICD-10-CM | POA: Diagnosis not present

## 2017-02-04 DIAGNOSIS — N186 End stage renal disease: Secondary | ICD-10-CM | POA: Diagnosis not present

## 2017-02-04 DIAGNOSIS — D631 Anemia in chronic kidney disease: Secondary | ICD-10-CM | POA: Diagnosis not present

## 2017-02-04 DIAGNOSIS — N2581 Secondary hyperparathyroidism of renal origin: Secondary | ICD-10-CM | POA: Diagnosis not present

## 2017-02-04 DIAGNOSIS — D509 Iron deficiency anemia, unspecified: Secondary | ICD-10-CM | POA: Diagnosis not present

## 2017-02-06 DIAGNOSIS — N2581 Secondary hyperparathyroidism of renal origin: Secondary | ICD-10-CM | POA: Diagnosis not present

## 2017-02-06 DIAGNOSIS — D631 Anemia in chronic kidney disease: Secondary | ICD-10-CM | POA: Diagnosis not present

## 2017-02-06 DIAGNOSIS — D509 Iron deficiency anemia, unspecified: Secondary | ICD-10-CM | POA: Diagnosis not present

## 2017-02-06 DIAGNOSIS — N186 End stage renal disease: Secondary | ICD-10-CM | POA: Diagnosis not present

## 2017-02-09 DIAGNOSIS — D509 Iron deficiency anemia, unspecified: Secondary | ICD-10-CM | POA: Diagnosis not present

## 2017-02-09 DIAGNOSIS — D631 Anemia in chronic kidney disease: Secondary | ICD-10-CM | POA: Diagnosis not present

## 2017-02-09 DIAGNOSIS — N2581 Secondary hyperparathyroidism of renal origin: Secondary | ICD-10-CM | POA: Diagnosis not present

## 2017-02-09 DIAGNOSIS — N186 End stage renal disease: Secondary | ICD-10-CM | POA: Diagnosis not present

## 2017-02-11 DIAGNOSIS — N2581 Secondary hyperparathyroidism of renal origin: Secondary | ICD-10-CM | POA: Diagnosis not present

## 2017-02-11 DIAGNOSIS — D509 Iron deficiency anemia, unspecified: Secondary | ICD-10-CM | POA: Diagnosis not present

## 2017-02-11 DIAGNOSIS — D631 Anemia in chronic kidney disease: Secondary | ICD-10-CM | POA: Diagnosis not present

## 2017-02-11 DIAGNOSIS — N186 End stage renal disease: Secondary | ICD-10-CM | POA: Diagnosis not present

## 2017-02-13 DIAGNOSIS — N186 End stage renal disease: Secondary | ICD-10-CM | POA: Diagnosis not present

## 2017-02-13 DIAGNOSIS — D509 Iron deficiency anemia, unspecified: Secondary | ICD-10-CM | POA: Diagnosis not present

## 2017-02-13 DIAGNOSIS — D631 Anemia in chronic kidney disease: Secondary | ICD-10-CM | POA: Diagnosis not present

## 2017-02-13 DIAGNOSIS — N2581 Secondary hyperparathyroidism of renal origin: Secondary | ICD-10-CM | POA: Diagnosis not present

## 2017-02-16 DIAGNOSIS — N2581 Secondary hyperparathyroidism of renal origin: Secondary | ICD-10-CM | POA: Diagnosis not present

## 2017-02-16 DIAGNOSIS — D509 Iron deficiency anemia, unspecified: Secondary | ICD-10-CM | POA: Diagnosis not present

## 2017-02-16 DIAGNOSIS — N186 End stage renal disease: Secondary | ICD-10-CM | POA: Diagnosis not present

## 2017-02-16 DIAGNOSIS — D631 Anemia in chronic kidney disease: Secondary | ICD-10-CM | POA: Diagnosis not present

## 2017-02-18 DIAGNOSIS — N186 End stage renal disease: Secondary | ICD-10-CM | POA: Diagnosis not present

## 2017-02-18 DIAGNOSIS — D631 Anemia in chronic kidney disease: Secondary | ICD-10-CM | POA: Diagnosis not present

## 2017-02-18 DIAGNOSIS — N2581 Secondary hyperparathyroidism of renal origin: Secondary | ICD-10-CM | POA: Diagnosis not present

## 2017-02-18 DIAGNOSIS — D509 Iron deficiency anemia, unspecified: Secondary | ICD-10-CM | POA: Diagnosis not present

## 2017-02-20 DIAGNOSIS — N2581 Secondary hyperparathyroidism of renal origin: Secondary | ICD-10-CM | POA: Diagnosis not present

## 2017-02-20 DIAGNOSIS — D631 Anemia in chronic kidney disease: Secondary | ICD-10-CM | POA: Diagnosis not present

## 2017-02-20 DIAGNOSIS — D509 Iron deficiency anemia, unspecified: Secondary | ICD-10-CM | POA: Diagnosis not present

## 2017-02-20 DIAGNOSIS — N186 End stage renal disease: Secondary | ICD-10-CM | POA: Diagnosis not present

## 2017-02-23 DIAGNOSIS — N2581 Secondary hyperparathyroidism of renal origin: Secondary | ICD-10-CM | POA: Diagnosis not present

## 2017-02-23 DIAGNOSIS — N186 End stage renal disease: Secondary | ICD-10-CM | POA: Diagnosis not present

## 2017-02-23 DIAGNOSIS — D509 Iron deficiency anemia, unspecified: Secondary | ICD-10-CM | POA: Diagnosis not present

## 2017-02-23 DIAGNOSIS — D631 Anemia in chronic kidney disease: Secondary | ICD-10-CM | POA: Diagnosis not present

## 2017-02-25 DIAGNOSIS — N186 End stage renal disease: Secondary | ICD-10-CM | POA: Diagnosis not present

## 2017-02-25 DIAGNOSIS — D631 Anemia in chronic kidney disease: Secondary | ICD-10-CM | POA: Diagnosis not present

## 2017-02-25 DIAGNOSIS — D509 Iron deficiency anemia, unspecified: Secondary | ICD-10-CM | POA: Diagnosis not present

## 2017-02-25 DIAGNOSIS — N2581 Secondary hyperparathyroidism of renal origin: Secondary | ICD-10-CM | POA: Diagnosis not present

## 2017-02-27 DIAGNOSIS — D631 Anemia in chronic kidney disease: Secondary | ICD-10-CM | POA: Diagnosis not present

## 2017-02-27 DIAGNOSIS — N186 End stage renal disease: Secondary | ICD-10-CM | POA: Diagnosis not present

## 2017-02-27 DIAGNOSIS — N2581 Secondary hyperparathyroidism of renal origin: Secondary | ICD-10-CM | POA: Diagnosis not present

## 2017-02-27 DIAGNOSIS — D509 Iron deficiency anemia, unspecified: Secondary | ICD-10-CM | POA: Diagnosis not present

## 2017-03-02 DIAGNOSIS — D631 Anemia in chronic kidney disease: Secondary | ICD-10-CM | POA: Diagnosis not present

## 2017-03-02 DIAGNOSIS — D509 Iron deficiency anemia, unspecified: Secondary | ICD-10-CM | POA: Diagnosis not present

## 2017-03-02 DIAGNOSIS — N186 End stage renal disease: Secondary | ICD-10-CM | POA: Diagnosis not present

## 2017-03-02 DIAGNOSIS — N2581 Secondary hyperparathyroidism of renal origin: Secondary | ICD-10-CM | POA: Diagnosis not present

## 2017-03-04 DIAGNOSIS — N2581 Secondary hyperparathyroidism of renal origin: Secondary | ICD-10-CM | POA: Diagnosis not present

## 2017-03-04 DIAGNOSIS — N186 End stage renal disease: Secondary | ICD-10-CM | POA: Diagnosis not present

## 2017-03-04 DIAGNOSIS — Z992 Dependence on renal dialysis: Secondary | ICD-10-CM | POA: Diagnosis not present

## 2017-03-04 DIAGNOSIS — D631 Anemia in chronic kidney disease: Secondary | ICD-10-CM | POA: Diagnosis not present

## 2017-03-04 DIAGNOSIS — D509 Iron deficiency anemia, unspecified: Secondary | ICD-10-CM | POA: Diagnosis not present

## 2017-03-06 DIAGNOSIS — N2581 Secondary hyperparathyroidism of renal origin: Secondary | ICD-10-CM | POA: Diagnosis not present

## 2017-03-06 DIAGNOSIS — D631 Anemia in chronic kidney disease: Secondary | ICD-10-CM | POA: Diagnosis not present

## 2017-03-06 DIAGNOSIS — N186 End stage renal disease: Secondary | ICD-10-CM | POA: Diagnosis not present

## 2017-03-06 DIAGNOSIS — D509 Iron deficiency anemia, unspecified: Secondary | ICD-10-CM | POA: Diagnosis not present

## 2017-03-09 DIAGNOSIS — D631 Anemia in chronic kidney disease: Secondary | ICD-10-CM | POA: Diagnosis not present

## 2017-03-09 DIAGNOSIS — N186 End stage renal disease: Secondary | ICD-10-CM | POA: Diagnosis not present

## 2017-03-09 DIAGNOSIS — D509 Iron deficiency anemia, unspecified: Secondary | ICD-10-CM | POA: Diagnosis not present

## 2017-03-09 DIAGNOSIS — N2581 Secondary hyperparathyroidism of renal origin: Secondary | ICD-10-CM | POA: Diagnosis not present

## 2017-03-11 DIAGNOSIS — D509 Iron deficiency anemia, unspecified: Secondary | ICD-10-CM | POA: Diagnosis not present

## 2017-03-11 DIAGNOSIS — D631 Anemia in chronic kidney disease: Secondary | ICD-10-CM | POA: Diagnosis not present

## 2017-03-11 DIAGNOSIS — N186 End stage renal disease: Secondary | ICD-10-CM | POA: Diagnosis not present

## 2017-03-11 DIAGNOSIS — N2581 Secondary hyperparathyroidism of renal origin: Secondary | ICD-10-CM | POA: Diagnosis not present

## 2017-03-13 DIAGNOSIS — N2581 Secondary hyperparathyroidism of renal origin: Secondary | ICD-10-CM | POA: Diagnosis not present

## 2017-03-13 DIAGNOSIS — D631 Anemia in chronic kidney disease: Secondary | ICD-10-CM | POA: Diagnosis not present

## 2017-03-13 DIAGNOSIS — N186 End stage renal disease: Secondary | ICD-10-CM | POA: Diagnosis not present

## 2017-03-13 DIAGNOSIS — D509 Iron deficiency anemia, unspecified: Secondary | ICD-10-CM | POA: Diagnosis not present

## 2017-03-16 DIAGNOSIS — N2581 Secondary hyperparathyroidism of renal origin: Secondary | ICD-10-CM | POA: Diagnosis not present

## 2017-03-16 DIAGNOSIS — N186 End stage renal disease: Secondary | ICD-10-CM | POA: Diagnosis not present

## 2017-03-16 DIAGNOSIS — D631 Anemia in chronic kidney disease: Secondary | ICD-10-CM | POA: Diagnosis not present

## 2017-03-16 DIAGNOSIS — D509 Iron deficiency anemia, unspecified: Secondary | ICD-10-CM | POA: Diagnosis not present

## 2017-03-18 DIAGNOSIS — D631 Anemia in chronic kidney disease: Secondary | ICD-10-CM | POA: Diagnosis not present

## 2017-03-18 DIAGNOSIS — N186 End stage renal disease: Secondary | ICD-10-CM | POA: Diagnosis not present

## 2017-03-18 DIAGNOSIS — D509 Iron deficiency anemia, unspecified: Secondary | ICD-10-CM | POA: Diagnosis not present

## 2017-03-18 DIAGNOSIS — N2581 Secondary hyperparathyroidism of renal origin: Secondary | ICD-10-CM | POA: Diagnosis not present

## 2017-03-20 DIAGNOSIS — D509 Iron deficiency anemia, unspecified: Secondary | ICD-10-CM | POA: Diagnosis not present

## 2017-03-20 DIAGNOSIS — N2581 Secondary hyperparathyroidism of renal origin: Secondary | ICD-10-CM | POA: Diagnosis not present

## 2017-03-20 DIAGNOSIS — D631 Anemia in chronic kidney disease: Secondary | ICD-10-CM | POA: Diagnosis not present

## 2017-03-20 DIAGNOSIS — N186 End stage renal disease: Secondary | ICD-10-CM | POA: Diagnosis not present

## 2017-03-23 DIAGNOSIS — D631 Anemia in chronic kidney disease: Secondary | ICD-10-CM | POA: Diagnosis not present

## 2017-03-23 DIAGNOSIS — N2581 Secondary hyperparathyroidism of renal origin: Secondary | ICD-10-CM | POA: Diagnosis not present

## 2017-03-23 DIAGNOSIS — N186 End stage renal disease: Secondary | ICD-10-CM | POA: Diagnosis not present

## 2017-03-23 DIAGNOSIS — D509 Iron deficiency anemia, unspecified: Secondary | ICD-10-CM | POA: Diagnosis not present

## 2017-03-25 DIAGNOSIS — D509 Iron deficiency anemia, unspecified: Secondary | ICD-10-CM | POA: Diagnosis not present

## 2017-03-25 DIAGNOSIS — D631 Anemia in chronic kidney disease: Secondary | ICD-10-CM | POA: Diagnosis not present

## 2017-03-25 DIAGNOSIS — N2581 Secondary hyperparathyroidism of renal origin: Secondary | ICD-10-CM | POA: Diagnosis not present

## 2017-03-25 DIAGNOSIS — N186 End stage renal disease: Secondary | ICD-10-CM | POA: Diagnosis not present

## 2017-03-27 DIAGNOSIS — N186 End stage renal disease: Secondary | ICD-10-CM | POA: Diagnosis not present

## 2017-03-27 DIAGNOSIS — N2581 Secondary hyperparathyroidism of renal origin: Secondary | ICD-10-CM | POA: Diagnosis not present

## 2017-03-27 DIAGNOSIS — D631 Anemia in chronic kidney disease: Secondary | ICD-10-CM | POA: Diagnosis not present

## 2017-03-27 DIAGNOSIS — D509 Iron deficiency anemia, unspecified: Secondary | ICD-10-CM | POA: Diagnosis not present

## 2017-03-30 DIAGNOSIS — N2581 Secondary hyperparathyroidism of renal origin: Secondary | ICD-10-CM | POA: Diagnosis not present

## 2017-03-30 DIAGNOSIS — N186 End stage renal disease: Secondary | ICD-10-CM | POA: Diagnosis not present

## 2017-03-30 DIAGNOSIS — D509 Iron deficiency anemia, unspecified: Secondary | ICD-10-CM | POA: Diagnosis not present

## 2017-03-30 DIAGNOSIS — D631 Anemia in chronic kidney disease: Secondary | ICD-10-CM | POA: Diagnosis not present

## 2017-03-31 DIAGNOSIS — R0602 Shortness of breath: Secondary | ICD-10-CM | POA: Diagnosis not present

## 2017-03-31 DIAGNOSIS — Z5181 Encounter for therapeutic drug level monitoring: Secondary | ICD-10-CM | POA: Diagnosis not present

## 2017-03-31 DIAGNOSIS — R002 Palpitations: Secondary | ICD-10-CM | POA: Diagnosis not present

## 2017-03-31 DIAGNOSIS — Z79899 Other long term (current) drug therapy: Secondary | ICD-10-CM | POA: Diagnosis not present

## 2017-03-31 DIAGNOSIS — Z992 Dependence on renal dialysis: Secondary | ICD-10-CM | POA: Diagnosis not present

## 2017-03-31 DIAGNOSIS — Z87891 Personal history of nicotine dependence: Secondary | ICD-10-CM | POA: Diagnosis not present

## 2017-03-31 DIAGNOSIS — I517 Cardiomegaly: Secondary | ICD-10-CM | POA: Diagnosis not present

## 2017-03-31 DIAGNOSIS — R0989 Other specified symptoms and signs involving the circulatory and respiratory systems: Secondary | ICD-10-CM | POA: Diagnosis not present

## 2017-04-01 DIAGNOSIS — D631 Anemia in chronic kidney disease: Secondary | ICD-10-CM | POA: Diagnosis not present

## 2017-04-01 DIAGNOSIS — N186 End stage renal disease: Secondary | ICD-10-CM | POA: Diagnosis not present

## 2017-04-01 DIAGNOSIS — Z992 Dependence on renal dialysis: Secondary | ICD-10-CM | POA: Diagnosis not present

## 2017-04-01 DIAGNOSIS — D509 Iron deficiency anemia, unspecified: Secondary | ICD-10-CM | POA: Diagnosis not present

## 2017-04-01 DIAGNOSIS — N2581 Secondary hyperparathyroidism of renal origin: Secondary | ICD-10-CM | POA: Diagnosis not present

## 2017-04-03 DIAGNOSIS — N2581 Secondary hyperparathyroidism of renal origin: Secondary | ICD-10-CM | POA: Diagnosis not present

## 2017-04-03 DIAGNOSIS — D509 Iron deficiency anemia, unspecified: Secondary | ICD-10-CM | POA: Diagnosis not present

## 2017-04-03 DIAGNOSIS — E8770 Fluid overload, unspecified: Secondary | ICD-10-CM | POA: Diagnosis not present

## 2017-04-03 DIAGNOSIS — N186 End stage renal disease: Secondary | ICD-10-CM | POA: Diagnosis not present

## 2017-04-03 DIAGNOSIS — D631 Anemia in chronic kidney disease: Secondary | ICD-10-CM | POA: Diagnosis not present

## 2017-04-06 DIAGNOSIS — D631 Anemia in chronic kidney disease: Secondary | ICD-10-CM | POA: Diagnosis not present

## 2017-04-06 DIAGNOSIS — N2581 Secondary hyperparathyroidism of renal origin: Secondary | ICD-10-CM | POA: Diagnosis not present

## 2017-04-06 DIAGNOSIS — D509 Iron deficiency anemia, unspecified: Secondary | ICD-10-CM | POA: Diagnosis not present

## 2017-04-06 DIAGNOSIS — E8770 Fluid overload, unspecified: Secondary | ICD-10-CM | POA: Diagnosis not present

## 2017-04-06 DIAGNOSIS — N186 End stage renal disease: Secondary | ICD-10-CM | POA: Diagnosis not present

## 2017-04-08 DIAGNOSIS — D509 Iron deficiency anemia, unspecified: Secondary | ICD-10-CM | POA: Diagnosis not present

## 2017-04-08 DIAGNOSIS — D631 Anemia in chronic kidney disease: Secondary | ICD-10-CM | POA: Diagnosis not present

## 2017-04-08 DIAGNOSIS — N186 End stage renal disease: Secondary | ICD-10-CM | POA: Diagnosis not present

## 2017-04-08 DIAGNOSIS — N2581 Secondary hyperparathyroidism of renal origin: Secondary | ICD-10-CM | POA: Diagnosis not present

## 2017-04-08 DIAGNOSIS — E8770 Fluid overload, unspecified: Secondary | ICD-10-CM | POA: Diagnosis not present

## 2017-04-09 DIAGNOSIS — E8770 Fluid overload, unspecified: Secondary | ICD-10-CM | POA: Diagnosis not present

## 2017-04-09 DIAGNOSIS — N2581 Secondary hyperparathyroidism of renal origin: Secondary | ICD-10-CM | POA: Diagnosis not present

## 2017-04-09 DIAGNOSIS — D631 Anemia in chronic kidney disease: Secondary | ICD-10-CM | POA: Diagnosis not present

## 2017-04-09 DIAGNOSIS — D509 Iron deficiency anemia, unspecified: Secondary | ICD-10-CM | POA: Diagnosis not present

## 2017-04-09 DIAGNOSIS — N186 End stage renal disease: Secondary | ICD-10-CM | POA: Diagnosis not present

## 2017-04-10 DIAGNOSIS — D631 Anemia in chronic kidney disease: Secondary | ICD-10-CM | POA: Diagnosis not present

## 2017-04-10 DIAGNOSIS — D509 Iron deficiency anemia, unspecified: Secondary | ICD-10-CM | POA: Diagnosis not present

## 2017-04-10 DIAGNOSIS — N186 End stage renal disease: Secondary | ICD-10-CM | POA: Diagnosis not present

## 2017-04-10 DIAGNOSIS — E8770 Fluid overload, unspecified: Secondary | ICD-10-CM | POA: Diagnosis not present

## 2017-04-10 DIAGNOSIS — N2581 Secondary hyperparathyroidism of renal origin: Secondary | ICD-10-CM | POA: Diagnosis not present

## 2017-04-13 DIAGNOSIS — D509 Iron deficiency anemia, unspecified: Secondary | ICD-10-CM | POA: Diagnosis not present

## 2017-04-13 DIAGNOSIS — D631 Anemia in chronic kidney disease: Secondary | ICD-10-CM | POA: Diagnosis not present

## 2017-04-13 DIAGNOSIS — E8770 Fluid overload, unspecified: Secondary | ICD-10-CM | POA: Diagnosis not present

## 2017-04-13 DIAGNOSIS — N2581 Secondary hyperparathyroidism of renal origin: Secondary | ICD-10-CM | POA: Diagnosis not present

## 2017-04-13 DIAGNOSIS — N186 End stage renal disease: Secondary | ICD-10-CM | POA: Diagnosis not present

## 2017-04-15 DIAGNOSIS — N2581 Secondary hyperparathyroidism of renal origin: Secondary | ICD-10-CM | POA: Diagnosis not present

## 2017-04-15 DIAGNOSIS — D509 Iron deficiency anemia, unspecified: Secondary | ICD-10-CM | POA: Diagnosis not present

## 2017-04-15 DIAGNOSIS — D631 Anemia in chronic kidney disease: Secondary | ICD-10-CM | POA: Diagnosis not present

## 2017-04-15 DIAGNOSIS — E8770 Fluid overload, unspecified: Secondary | ICD-10-CM | POA: Diagnosis not present

## 2017-04-15 DIAGNOSIS — N186 End stage renal disease: Secondary | ICD-10-CM | POA: Diagnosis not present

## 2017-04-17 DIAGNOSIS — N2581 Secondary hyperparathyroidism of renal origin: Secondary | ICD-10-CM | POA: Diagnosis not present

## 2017-04-17 DIAGNOSIS — D631 Anemia in chronic kidney disease: Secondary | ICD-10-CM | POA: Diagnosis not present

## 2017-04-17 DIAGNOSIS — D509 Iron deficiency anemia, unspecified: Secondary | ICD-10-CM | POA: Diagnosis not present

## 2017-04-17 DIAGNOSIS — E8770 Fluid overload, unspecified: Secondary | ICD-10-CM | POA: Diagnosis not present

## 2017-04-17 DIAGNOSIS — N186 End stage renal disease: Secondary | ICD-10-CM | POA: Diagnosis not present

## 2017-04-19 DIAGNOSIS — D509 Iron deficiency anemia, unspecified: Secondary | ICD-10-CM | POA: Diagnosis not present

## 2017-04-19 DIAGNOSIS — D631 Anemia in chronic kidney disease: Secondary | ICD-10-CM | POA: Diagnosis not present

## 2017-04-19 DIAGNOSIS — N186 End stage renal disease: Secondary | ICD-10-CM | POA: Diagnosis not present

## 2017-04-19 DIAGNOSIS — N2581 Secondary hyperparathyroidism of renal origin: Secondary | ICD-10-CM | POA: Diagnosis not present

## 2017-04-19 DIAGNOSIS — E8770 Fluid overload, unspecified: Secondary | ICD-10-CM | POA: Diagnosis not present

## 2017-04-22 DIAGNOSIS — N2581 Secondary hyperparathyroidism of renal origin: Secondary | ICD-10-CM | POA: Diagnosis not present

## 2017-04-22 DIAGNOSIS — N186 End stage renal disease: Secondary | ICD-10-CM | POA: Diagnosis not present

## 2017-04-22 DIAGNOSIS — D509 Iron deficiency anemia, unspecified: Secondary | ICD-10-CM | POA: Diagnosis not present

## 2017-04-22 DIAGNOSIS — E8770 Fluid overload, unspecified: Secondary | ICD-10-CM | POA: Diagnosis not present

## 2017-04-22 DIAGNOSIS — D631 Anemia in chronic kidney disease: Secondary | ICD-10-CM | POA: Diagnosis not present

## 2017-04-23 ENCOUNTER — Encounter: Payer: Self-pay | Admitting: General Practice

## 2017-04-24 DIAGNOSIS — D631 Anemia in chronic kidney disease: Secondary | ICD-10-CM | POA: Diagnosis not present

## 2017-04-24 DIAGNOSIS — N186 End stage renal disease: Secondary | ICD-10-CM | POA: Diagnosis not present

## 2017-04-24 DIAGNOSIS — D509 Iron deficiency anemia, unspecified: Secondary | ICD-10-CM | POA: Diagnosis not present

## 2017-04-24 DIAGNOSIS — N2581 Secondary hyperparathyroidism of renal origin: Secondary | ICD-10-CM | POA: Diagnosis not present

## 2017-04-24 DIAGNOSIS — E8770 Fluid overload, unspecified: Secondary | ICD-10-CM | POA: Diagnosis not present

## 2017-04-27 DIAGNOSIS — N2581 Secondary hyperparathyroidism of renal origin: Secondary | ICD-10-CM | POA: Diagnosis not present

## 2017-04-27 DIAGNOSIS — N186 End stage renal disease: Secondary | ICD-10-CM | POA: Diagnosis not present

## 2017-04-27 DIAGNOSIS — D509 Iron deficiency anemia, unspecified: Secondary | ICD-10-CM | POA: Diagnosis not present

## 2017-04-27 DIAGNOSIS — E8770 Fluid overload, unspecified: Secondary | ICD-10-CM | POA: Diagnosis not present

## 2017-04-27 DIAGNOSIS — D631 Anemia in chronic kidney disease: Secondary | ICD-10-CM | POA: Diagnosis not present

## 2017-04-29 DIAGNOSIS — D509 Iron deficiency anemia, unspecified: Secondary | ICD-10-CM | POA: Diagnosis not present

## 2017-04-29 DIAGNOSIS — E8770 Fluid overload, unspecified: Secondary | ICD-10-CM | POA: Diagnosis not present

## 2017-04-29 DIAGNOSIS — N186 End stage renal disease: Secondary | ICD-10-CM | POA: Diagnosis not present

## 2017-04-29 DIAGNOSIS — D631 Anemia in chronic kidney disease: Secondary | ICD-10-CM | POA: Diagnosis not present

## 2017-04-29 DIAGNOSIS — N2581 Secondary hyperparathyroidism of renal origin: Secondary | ICD-10-CM | POA: Diagnosis not present

## 2017-04-30 DIAGNOSIS — D509 Iron deficiency anemia, unspecified: Secondary | ICD-10-CM | POA: Diagnosis not present

## 2017-04-30 DIAGNOSIS — E8770 Fluid overload, unspecified: Secondary | ICD-10-CM | POA: Diagnosis not present

## 2017-04-30 DIAGNOSIS — N186 End stage renal disease: Secondary | ICD-10-CM | POA: Diagnosis not present

## 2017-04-30 DIAGNOSIS — D631 Anemia in chronic kidney disease: Secondary | ICD-10-CM | POA: Diagnosis not present

## 2017-04-30 DIAGNOSIS — N2581 Secondary hyperparathyroidism of renal origin: Secondary | ICD-10-CM | POA: Diagnosis not present

## 2017-05-01 DIAGNOSIS — E8770 Fluid overload, unspecified: Secondary | ICD-10-CM | POA: Diagnosis not present

## 2017-05-01 DIAGNOSIS — D509 Iron deficiency anemia, unspecified: Secondary | ICD-10-CM | POA: Diagnosis not present

## 2017-05-01 DIAGNOSIS — N186 End stage renal disease: Secondary | ICD-10-CM | POA: Diagnosis not present

## 2017-05-01 DIAGNOSIS — D631 Anemia in chronic kidney disease: Secondary | ICD-10-CM | POA: Diagnosis not present

## 2017-05-01 DIAGNOSIS — N2581 Secondary hyperparathyroidism of renal origin: Secondary | ICD-10-CM | POA: Diagnosis not present

## 2017-05-02 ENCOUNTER — Other Ambulatory Visit: Payer: Self-pay

## 2017-05-02 ENCOUNTER — Emergency Department (HOSPITAL_COMMUNITY): Payer: Medicare Other

## 2017-05-02 ENCOUNTER — Emergency Department (HOSPITAL_COMMUNITY)
Admission: EM | Admit: 2017-05-02 | Discharge: 2017-05-02 | Disposition: A | Discharge status: Home / Self Care | Payer: Medicare Other | Attending: Emergency Medicine | Admitting: Emergency Medicine

## 2017-05-02 ENCOUNTER — Encounter (HOSPITAL_COMMUNITY): Payer: Self-pay | Admitting: Emergency Medicine

## 2017-05-02 DIAGNOSIS — Z87891 Personal history of nicotine dependence: Secondary | ICD-10-CM | POA: Insufficient documentation

## 2017-05-02 DIAGNOSIS — Z91010 Allergy to peanuts: Secondary | ICD-10-CM | POA: Insufficient documentation

## 2017-05-02 DIAGNOSIS — N186 End stage renal disease: Secondary | ICD-10-CM | POA: Diagnosis not present

## 2017-05-02 DIAGNOSIS — R0602 Shortness of breath: Secondary | ICD-10-CM | POA: Diagnosis not present

## 2017-05-02 DIAGNOSIS — Z992 Dependence on renal dialysis: Secondary | ICD-10-CM | POA: Diagnosis not present

## 2017-05-02 DIAGNOSIS — J449 Chronic obstructive pulmonary disease, unspecified: Secondary | ICD-10-CM | POA: Diagnosis not present

## 2017-05-02 DIAGNOSIS — R0600 Dyspnea, unspecified: Secondary | ICD-10-CM | POA: Diagnosis not present

## 2017-05-02 DIAGNOSIS — Z79899 Other long term (current) drug therapy: Secondary | ICD-10-CM | POA: Diagnosis not present

## 2017-05-02 DIAGNOSIS — I509 Heart failure, unspecified: Secondary | ICD-10-CM | POA: Diagnosis not present

## 2017-05-02 DIAGNOSIS — I132 Hypertensive heart and chronic kidney disease with heart failure and with stage 5 chronic kidney disease, or end stage renal disease: Secondary | ICD-10-CM | POA: Insufficient documentation

## 2017-05-02 LAB — BRAIN NATRIURETIC PEPTIDE: B Natriuretic Peptide: 704.2 pg/mL — ABNORMAL HIGH (ref 0.0–100.0)

## 2017-05-02 LAB — COMPREHENSIVE METABOLIC PANEL
ALT: 11 U/L — AB (ref 14–54)
AST: 17 U/L (ref 15–41)
Albumin: 3.7 g/dL (ref 3.5–5.0)
Alkaline Phosphatase: 288 U/L — ABNORMAL HIGH (ref 38–126)
Anion gap: 13 (ref 5–15)
BUN: 14 mg/dL (ref 6–20)
CHLORIDE: 97 mmol/L — AB (ref 101–111)
CO2: 28 mmol/L (ref 22–32)
CREATININE: 5.68 mg/dL — AB (ref 0.44–1.00)
Calcium: 8.4 mg/dL — ABNORMAL LOW (ref 8.9–10.3)
GFR calc Af Amer: 9 mL/min — ABNORMAL LOW (ref >60)
GFR calc non Af Amer: 8 mL/min — ABNORMAL LOW (ref >60)
Glucose, Bld: 111 mg/dL — ABNORMAL HIGH (ref 65–99)
Potassium: 3.8 mmol/L (ref 3.5–5.1)
Sodium: 138 mmol/L (ref 135–145)
Total Bilirubin: 0.8 mg/dL (ref 0.3–1.2)
Total Protein: 7.3 g/dL (ref 6.5–8.1)

## 2017-05-02 LAB — CBC WITH DIFFERENTIAL/PLATELET
BASOS ABS: 0 10*3/uL (ref 0.0–0.1)
Basophils Relative: 0 %
Eosinophils Absolute: 0.1 10*3/uL (ref 0.0–0.7)
Eosinophils Relative: 1 %
HCT: 37.5 % (ref 36.0–46.0)
Hemoglobin: 12.1 g/dL (ref 12.0–15.0)
LYMPHS PCT: 10 %
Lymphs Abs: 0.6 10*3/uL — ABNORMAL LOW (ref 0.7–4.0)
MCH: 27.3 pg (ref 26.0–34.0)
MCHC: 32.3 g/dL (ref 30.0–36.0)
MCV: 84.5 fL (ref 78.0–100.0)
Monocytes Absolute: 0.2 10*3/uL (ref 0.1–1.0)
Monocytes Relative: 4 %
NEUTROS ABS: 5.1 10*3/uL (ref 1.7–7.7)
NEUTROS PCT: 85 %
PLATELETS: 135 10*3/uL — AB (ref 150–400)
RBC: 4.44 MIL/uL (ref 3.87–5.11)
RDW: 18.5 % — ABNORMAL HIGH (ref 11.5–15.5)
WBC: 6 10*3/uL (ref 4.0–10.5)

## 2017-05-02 LAB — I-STAT TROPONIN, ED: Troponin i, poc: 0 ng/mL (ref 0.00–0.08)

## 2017-05-02 MED ORDER — ALBUTEROL SULFATE HFA 108 (90 BASE) MCG/ACT IN AERS: 1.0000 | INHALATION_SPRAY | Freq: Four times a day (QID) | RESPIRATORY_TRACT | 0 refills | Status: AC | PRN

## 2017-05-02 MED ORDER — ALBUTEROL SULFATE (2.5 MG/3ML) 0.083% IN NEBU
5.0000 mg | INHALATION_SOLUTION | Freq: Once | RESPIRATORY_TRACT | Status: AC
  Administered 2017-05-02: 5 mg via RESPIRATORY_TRACT
  Filled 2017-05-02: qty 6

## 2017-05-02 NOTE — ED Notes (Signed)
The pt reports that she feels much better

## 2017-05-02 NOTE — ED Provider Notes (Signed)
[facility] EMERGENCY DEPARTMENT Provider Note   CSN: [id number] Arrival date & time: 05/02/17  1045     History   Chief Complaint Chief Complaint  Patient presents with  . Shortness of Breath    HPI Brittany Ramirez is a 52 y.o. female. CC:  SOB  HPI: Brittany Ramirez is a 52 year old female.  Long history of end-stage renal disease on Tuesday, Thursday, Saturday HD.  Today is Sunday.  She had her scheduled dialysis Tuesday and Thursday.  Her pressure remained high Thursday after a complete dialysis run.  She was scheduled again for Friday.  Her blood pressure improved.  She was back again Saturday, yesterday.  She was again given a half a run but became hypotensive.  She states she achieved her dry weight after dialysis yesterday.  Past Medical History:  Diagnosis Date  . COPD (chronic obstructive pulmonary disease) (HCC)   . Cryptogenic organizing pneumonia (HCC)   . End stage renal disease (HCC)   . Essential hypertension   . Restrictive lung disease     Patient Active Problem List   Diagnosis Date Noted  . COPD exacerbation (HCC) 02/13/2015  . Acute respiratory failure (HCC) 02/13/2015  . Pneumonia 02/13/2015  . CHF (congestive heart failure) (HCC) 02/12/2015    Past Surgical History:  Procedure Laterality Date  . AV fistula right lower arm Right      OB History   None      Home Medications    Prior to Admission medications   Medication Sig Start Date End Date Taking? Authorizing Provider  albuterol (PROVENTIL HFA;VENTOLIN HFA) 108 (90 Base) MCG/ACT inhaler Inhale 1-2 puffs into the lungs every 6 (six) hours as needed for wheezing. 05/02/17   Rolland Porter, MD  calcium carbonate (TUMS - DOSED IN MG ELEMENTAL CALCIUM) 500 MG chewable tablet Chew 1 tablet by mouth 3 (three) times daily.    [provider]  carvedilol (COREG) 25 MG tablet Take 25 mg by mouth 2 (two) times daily.    [provider]  cinacalcet (SENSIPAR) 30 MG  tablet Take 90 mg by mouth daily.    [provider]  NIFEdipine (PROCARDIA XL/ADALAT-CC) 90 MG 24 hr tablet Take 90 mg by mouth daily.    [provider]  predniSONE (DELTASONE) 10 MG tablet Take 10 mg by mouth daily.    [provider]  predniSONE (DELTASONE) 10 MG tablet Take 1 tablet (10 mg total) by mouth daily with breakfast. 40 mg PO (by mouth) x 1 days 30 mg PO x 2 days 20 mg PO x 2 days the resume  10 mg PO daily 02/15/15   Adrian Saran, MD  traMADol (ULTRAM) 50 MG tablet Take 1 tablet (50 mg total) by mouth every 12 (twelve) hours as needed for moderate pain (Do not drive or operate machinery while taking as can cause drowsiness.). 04/14/16   Renford Dills, NP    Family History Family History  Problem Relation Age of Onset  . Diabetes Sister     Social History Social History   Tobacco Use  . Smoking status: Former Smoker    Types: Cigarettes  . Smokeless tobacco: Never Used  Substance Use Topics  . Alcohol use: Yes    Comment: occas.   . Drug use: Not on file     Allergies   Iron; Peanuts [peanut oil]; Penicillins; Potassium-containing compounds; Septra [sulfamethoxazole-trimethoprim]; Skelaxin [metaxalone]; Erythromycin; and Iodine   Review of Systems Review of Systems  Constitutional: Negative  for appetite change, chills, diaphoresis, fatigue and fever.  HENT: Negative for mouth sores, sore throat and trouble swallowing.   Eyes: Negative for visual disturbance.  Respiratory: Positive for shortness of breath. Negative for cough, chest tightness and wheezing.   Cardiovascular: Negative for chest pain.  Gastrointestinal: Negative for abdominal distention, abdominal pain, diarrhea, nausea and vomiting.  Endocrine: Negative for polydipsia, polyphagia and polyuria.  Genitourinary: Negative for dysuria, frequency and hematuria.  Musculoskeletal: Negative for gait problem.  Skin: Negative for color change, pallor and rash.  Neurological:  Negative for dizziness, syncope, light-headedness and headaches.  Hematological: Does not bruise/bleed easily.  Psychiatric/Behavioral: Negative for behavioral problems and confusion.     Physical Exam Updated Vital Signs BP (!) 152/96   Pulse 82   Temp 98.2 F (36.8 C) (Oral)   Resp 18   SpO2 94%   Physical Exam  Constitutional: She is oriented to person, place, and time. She appears well-developed and well-nourished. No distress.  Pleasant 52 year old female.  Sitting upright.  Speaking full sentences.  No distress.  HENT:  Head: Normocephalic.  Eyes: Pupils are equal, round, and reactive to light. Conjunctivae are normal. No scleral icterus.  Neck: Normal range of motion. Neck supple. No thyromegaly present.  Cardiovascular: Normal rate and regular rhythm. Exam reveals no gallop and no friction rub.  No murmur heard. Pulmonary/Chest: Effort normal. No respiratory distress. She has no wheezes. She has no rales.  Clear bilateral breath sounds.  No wheezing rales or rhonchi.  Abdominal: Soft. Bowel sounds are normal. She exhibits no distension. There is no tenderness. There is no rebound.  Musculoskeletal: Normal range of motion.  Neurological: She is alert and oriented to person, place, and time.  Skin: Skin is warm and dry. No rash noted.  Psychiatric: She has a normal mood and affect. Her behavior is normal.     ED Treatments / Results  Labs (all labs ordered are listed, but only abnormal results are displayed) Labs Reviewed  BRAIN NATRIURETIC PEPTIDE - Abnormal; Notable for the following components:      Result Value   B Natriuretic Peptide 704.2 (*)    All other components within normal limits  CBC WITH DIFFERENTIAL/PLATELET - Abnormal; Notable for the following components:   RDW 18.5 (*)    Platelets 135 (*)    Lymphs Abs 0.6 (*)    All other components within normal limits  COMPREHENSIVE METABOLIC PANEL - Abnormal; Notable for the following components:    Chloride 97 (*)    Glucose, Bld 111 (*)    Creatinine, Ser 5.68 (*)    Calcium 8.4 (*)    ALT 11 (*)    Alkaline Phosphatase 288 (*)    GFR calc non Af Amer 8 (*)    GFR calc Af Amer 9 (*)    All other components within normal limits  I-STAT TROPONIN, ED    EKG EKG Interpretation  Date/Time:  Sunday May 02 2017 10:56:38 EDT Ventricular Rate:  81 PR Interval:  170 QRS Duration: 78 QT Interval:  408 QTC Calculation: 473 R Axis:   86 Text Interpretation:  Normal sinus rhythm Septal infarct , age undetermined Abnormal ECG No significant change since last tracing Confirmed by Alvira Monday ([id number]) on 05/02/2017 2:51:36 PM   Radiology Dg Chest 2 View  Result Date: 05/02/2017 CLINICAL DATA:  Decreased blood pressure. Shortness of breath. Dialysis patient. EXAM: CHEST - 2 VIEW COMPARISON:  02/13/2015 FINDINGS: Hyperinflation. 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 Right subclavian stent. Midline trachea. Marked cardiomegaly. No pleural effusion or pneumothorax. Mild interstitial prominence. No lobar consolidation. IMPRESSION: Cardiomegaly and mild pulmonary venous congestion. Electronically Signed   By: Jeronimo GreavesKyle  Talbot M.D.   On: 05/02/2017 12:12    Procedures Procedures (including critical care time)  Medications Ordered in ED Medications  albuterol (PROVENTIL) (2.5 MG/3ML) 0.083% nebulizer solution 5 mg (5 mg Nebulization Given 05/02/17 1438)     Initial Impression / Assessment and Plan / ED Course  I have reviewed the triage vital signs and the nursing notes.  Pertinent labs & imaging results that were available during my care of the patient were reviewed by me and considered in my medical decision making (see chart for details).    EKG without changes.  Chest x-ray shows pulmonary congestion, but no edema.  Stable cardiomegaly.  Potassium 3.8.  She was given albuterol neb.  She states she feels "much much better".  She is  intending to call her dialysis center to undergo dialysis tomorrow as she has regained a few pounds.  However no indication for acute dialysis today.  She is comfortable.  She is well oxygenated.  No distress.  Final Clinical Impressions(s) / ED Diagnoses   Final diagnoses:  Dyspnea, unspecified type    ED Discharge Orders        Ordered    albuterol (PROVENTIL HFA;VENTOLIN HFA) 108 (90 Base) MCG/ACT inhaler  Every 6 hours PRN     05/02/17 1602       Rolland PorterJames, Zamira Hickam, MD 05/02/17 1607

## 2017-05-02 NOTE — ED Triage Notes (Signed)
Had dialysis yesterday ( also Thursday Friday)   Her bp dropped yesteray woke up this am and was sob,   Had some tightness when she  Breatths,  States did take her meds today

## 2017-05-02 NOTE — Discharge Instructions (Addendum)
Continue meds and dialysis as scheduled.

## 2017-05-04 DIAGNOSIS — D631 Anemia in chronic kidney disease: Secondary | ICD-10-CM | POA: Diagnosis not present

## 2017-05-04 DIAGNOSIS — N186 End stage renal disease: Secondary | ICD-10-CM | POA: Diagnosis not present

## 2017-05-04 DIAGNOSIS — N2581 Secondary hyperparathyroidism of renal origin: Secondary | ICD-10-CM | POA: Diagnosis not present

## 2017-05-04 DIAGNOSIS — D509 Iron deficiency anemia, unspecified: Secondary | ICD-10-CM | POA: Diagnosis not present

## 2017-05-06 ENCOUNTER — Emergency Department: Payer: Medicare Other

## 2017-05-06 ENCOUNTER — Observation Stay
Admission: EM | Admit: 2017-05-06 | Discharge: 2017-05-06 | Disposition: A | Discharge status: Home / Self Care | Payer: Medicare Other | Attending: Internal Medicine | Admitting: Internal Medicine

## 2017-05-06 ENCOUNTER — Other Ambulatory Visit: Payer: Self-pay

## 2017-05-06 DIAGNOSIS — D631 Anemia in chronic kidney disease: Secondary | ICD-10-CM | POA: Diagnosis not present

## 2017-05-06 DIAGNOSIS — I509 Heart failure, unspecified: Secondary | ICD-10-CM | POA: Diagnosis not present

## 2017-05-06 DIAGNOSIS — R0602 Shortness of breath: Secondary | ICD-10-CM | POA: Diagnosis not present

## 2017-05-06 DIAGNOSIS — Z91010 Allergy to peanuts: Secondary | ICD-10-CM | POA: Diagnosis not present

## 2017-05-06 DIAGNOSIS — Z87891 Personal history of nicotine dependence: Secondary | ICD-10-CM | POA: Insufficient documentation

## 2017-05-06 DIAGNOSIS — R0603 Acute respiratory distress: Secondary | ICD-10-CM

## 2017-05-06 DIAGNOSIS — Z79899 Other long term (current) drug therapy: Secondary | ICD-10-CM | POA: Insufficient documentation

## 2017-05-06 DIAGNOSIS — J811 Chronic pulmonary edema: Secondary | ICD-10-CM | POA: Diagnosis not present

## 2017-05-06 DIAGNOSIS — N186 End stage renal disease: Secondary | ICD-10-CM | POA: Diagnosis not present

## 2017-05-06 DIAGNOSIS — J849 Interstitial pulmonary disease, unspecified: Secondary | ICD-10-CM | POA: Insufficient documentation

## 2017-05-06 DIAGNOSIS — I120 Hypertensive chronic kidney disease with stage 5 chronic kidney disease or end stage renal disease: Secondary | ICD-10-CM | POA: Diagnosis not present

## 2017-05-06 DIAGNOSIS — N2581 Secondary hyperparathyroidism of renal origin: Secondary | ICD-10-CM | POA: Diagnosis not present

## 2017-05-06 DIAGNOSIS — J810 Acute pulmonary edema: Secondary | ICD-10-CM | POA: Diagnosis not present

## 2017-05-06 DIAGNOSIS — J449 Chronic obstructive pulmonary disease, unspecified: Secondary | ICD-10-CM | POA: Insufficient documentation

## 2017-05-06 DIAGNOSIS — Z888 Allergy status to other drugs, medicaments and biological substances status: Secondary | ICD-10-CM | POA: Insufficient documentation

## 2017-05-06 DIAGNOSIS — Z992 Dependence on renal dialysis: Secondary | ICD-10-CM | POA: Insufficient documentation

## 2017-05-06 DIAGNOSIS — E8770 Fluid overload, unspecified: Principal | ICD-10-CM | POA: Diagnosis present

## 2017-05-06 DIAGNOSIS — J9610 Chronic respiratory failure, unspecified whether with hypoxia or hypercapnia: Secondary | ICD-10-CM | POA: Diagnosis not present

## 2017-05-06 DIAGNOSIS — I132 Hypertensive heart and chronic kidney disease with heart failure and with stage 5 chronic kidney disease, or end stage renal disease: Secondary | ICD-10-CM | POA: Insufficient documentation

## 2017-05-06 DIAGNOSIS — J90 Pleural effusion, not elsewhere classified: Secondary | ICD-10-CM | POA: Diagnosis not present

## 2017-05-06 DIAGNOSIS — Z881 Allergy status to other antibiotic agents status: Secondary | ICD-10-CM | POA: Insufficient documentation

## 2017-05-06 DIAGNOSIS — Z880 Allergy status to penicillin: Secondary | ICD-10-CM | POA: Insufficient documentation

## 2017-05-06 DIAGNOSIS — Z940 Kidney transplant status: Secondary | ICD-10-CM | POA: Insufficient documentation

## 2017-05-06 DIAGNOSIS — I10 Essential (primary) hypertension: Secondary | ICD-10-CM | POA: Diagnosis not present

## 2017-05-06 LAB — CBC WITH DIFFERENTIAL/PLATELET
BASOS ABS: 0 10*3/uL (ref 0–0.1)
Basophils Relative: 0 %
EOS ABS: 0.1 10*3/uL (ref 0–0.7)
EOS PCT: 2 %
HCT: 37.2 % (ref 35.0–47.0)
Hemoglobin: 12.2 g/dL (ref 12.0–16.0)
LYMPHS ABS: 0.8 10*3/uL — AB (ref 1.0–3.6)
LYMPHS PCT: 18 %
MCH: 27.9 pg (ref 26.0–34.0)
MCHC: 32.7 g/dL (ref 32.0–36.0)
MCV: 85.4 fL (ref 80.0–100.0)
MONO ABS: 0.3 10*3/uL (ref 0.2–0.9)
Monocytes Relative: 7 %
Neutro Abs: 3.3 10*3/uL (ref 1.4–6.5)
Neutrophils Relative %: 73 %
PLATELETS: 130 10*3/uL — AB (ref 150–440)
RBC: 4.36 MIL/uL (ref 3.80–5.20)
RDW: 20 % — ABNORMAL HIGH (ref 11.5–14.5)
WBC: 4.6 10*3/uL (ref 3.6–11.0)

## 2017-05-06 LAB — BASIC METABOLIC PANEL
Anion gap: 13 (ref 5–15)
BUN: 38 mg/dL — AB (ref 6–20)
CO2: 27 mmol/L (ref 22–32)
CREATININE: 7.93 mg/dL — AB (ref 0.44–1.00)
Calcium: 7.6 mg/dL — ABNORMAL LOW (ref 8.9–10.3)
Chloride: 101 mmol/L (ref 101–111)
GFR calc Af Amer: 6 mL/min — ABNORMAL LOW (ref >60)
GFR, EST NON AFRICAN AMERICAN: 5 mL/min — AB (ref >60)
GLUCOSE: 78 mg/dL (ref 65–99)
Potassium: 4.5 mmol/L (ref 3.5–5.1)
SODIUM: 141 mmol/L (ref 135–145)

## 2017-05-06 LAB — HEMOGLOBIN A1C
Hgb A1c MFr Bld: 4.4 % — ABNORMAL LOW (ref 4.8–5.6)
Mean Plasma Glucose: 79.58 mg/dL

## 2017-05-06 LAB — BRAIN NATRIURETIC PEPTIDE: B NATRIURETIC PEPTIDE 5: 524 pg/mL — AB (ref 0.0–100.0)

## 2017-05-06 LAB — TROPONIN I: Troponin I: <0.03 ng/mL (ref <0.03)

## 2017-05-06 LAB — TSH: TSH: 2.309 u[IU]/mL (ref 0.350–4.500)

## 2017-05-06 MED ORDER — ONDANSETRON HCL 4 MG/2ML IJ SOLN: 4.0000 mg | Freq: Four times a day (QID) | INTRAMUSCULAR | Status: DC | PRN

## 2017-05-06 MED ORDER — ACETAMINOPHEN 650 MG RE SUPP: 650.0000 mg | Freq: Four times a day (QID) | RECTAL | Status: DC | PRN

## 2017-05-06 MED ORDER — FUROSEMIDE 10 MG/ML IJ SOLN
INTRAMUSCULAR | Status: AC
  Administered 2017-05-06: 80 mg via INTRAVENOUS
  Filled 2017-05-06: qty 8

## 2017-05-06 MED ORDER — FUROSEMIDE 10 MG/ML IJ SOLN
80.0000 mg | Freq: Once | INTRAMUSCULAR | Status: AC
  Administered 2017-05-06: 80 mg via INTRAVENOUS

## 2017-05-06 MED ORDER — HEPARIN SODIUM (PORCINE) 5000 UNIT/ML IJ SOLN: 5000.0000 [IU] | Freq: Three times a day (TID) | INTRAMUSCULAR | Status: DC

## 2017-05-06 MED ORDER — DOCUSATE SODIUM 100 MG PO CAPS: 100.0000 mg | ORAL_CAPSULE | Freq: Two times a day (BID) | ORAL | Status: DC

## 2017-05-06 MED ORDER — ALBUTEROL SULFATE (2.5 MG/3ML) 0.083% IN NEBU: 2.5000 mg | INHALATION_SOLUTION | RESPIRATORY_TRACT | Status: DC | PRN

## 2017-05-06 MED ORDER — ACETAMINOPHEN 325 MG PO TABS: 650.0000 mg | ORAL_TABLET | Freq: Four times a day (QID) | ORAL | Status: DC | PRN

## 2017-05-06 MED ORDER — ONDANSETRON HCL 4 MG PO TABS: 4.0000 mg | ORAL_TABLET | Freq: Four times a day (QID) | ORAL | Status: DC | PRN

## 2017-05-06 NOTE — Progress Notes (Signed)
   05/06/17 1030  Vital Signs  Temp 99.2 F (37.3 C)  Temp Source Oral  Pulse Rate 86  Pulse Rate Source Monitor  Resp 20  BP (!) 149/92  BP Location Left Arm  BP Method Automatic  Patient Position (if appropriate) Lying  Oxygen Therapy  SpO2 94 %  O2 Device Nasal Cannula  O2 Flow Rate (L/min) 2 L/min  Pain Assessment  Pain Scale 0-10  Pain Score 0  Dialysis Weight  Weight 58.4 kg (128 lb 12 oz)  Type of Weight Pre-Dialysis  Estimated Dry Weight 56.5 kg (124 lb 9 oz)  Time-Out for Hemodialysis  What Procedure? hemodialysis  Pt Identifiers(min of two) First/Last Name;MRN/Account#  Correct Site? Yes  Correct Side? Yes  Correct Procedure? Yes  Consents Verified? Yes  Safety Precautions Reviewed? Yes  Biochemist, clinicalMachine Checks  Machine Number  (5a)  Station Number 3  UF/Alarm Test Passed  Conductivity: Meter 14.2  Conductivity: Machine  14  pH 7  Normal Saline Lot Number [id number]  Dialyzer Lot Number [id number]  Dialysate Acid Bath Lot Number [id number]  Dialysate HCO3 Bath Lot Number [id number]  Machine Temperature 96.8 F (36 C)  ImmunologistAir Detector Armed and Audible Yes  Blood Lines Intact and Secured Yes  Pre Treatment Patient Checks  Vascular access used during treatment Fistula  Hepatitis B Surface Antigen Results Negative  Date Hepatitis B Surface Antigen Drawn 03/25/17  Hepatitis B Surface Antibody  (850)  Date Hepatitis B Surface Antibody Drawn 03/25/17  Hemodialysis Consent Verified Yes  Hemodialysis Standing Orders Initiated Yes  ECG (Telemetry) Monitor On Yes  Prime Ordered Normal Saline  Length of  DialysisTreatment -hour(s) 3 Hour(s)  Dialysis Treatment Comments hD started per md order  Dialyzer Optiflux 180 NR  Dialysate 3K, 2.5 Ca  Dialysis Anticoagulant None  Dialysate Flow Ordered 600  Blood Flow Rate Ordered 300 mL/min  Ultrafiltration Goal 2500 Liters  Pre Treatment Labs  (a1c)  During Hemodialysis Assessment  Blood Flow Rate (mL/min) 200 mL/min  Arterial  Pressure (mmHg) -30 mmHg  Venous Pressure (mmHg) 100 mmHg  Transmembrane Pressure (mmHg) 70 mmHg  Ultrafiltration Rate (mL/min) 0.83 mL/min  Dialysate Flow Rate (mL/min) 600 ml/min  Conductivity: Machine  14  HD Safety Checks Performed Yes  Dialysis Fluid Bolus Normal Saline  Bolus Amount (mL) 200 mL  Intra-Hemodialysis Comments Tx initiated  Education / Care Plan  Dialysis Education Provided Yes

## 2017-05-06 NOTE — Discharge Summary (Signed)
Brittany Ramirez, is a 52 y.o. female  DOB [birth year]  MRN [id number].  Admission date:  05/06/2017  Admitting Physician  Arnaldo Natal, MD  Discharge Date:  05/06/2017   Primary MD  Maida Sale, MD  Recommendations for primary care physician for things to follow:   Follow with PCP in 1 week   Admission Diagnosis  Acute pulmonary edema (HCC) [J81.0] Respiratory distress [R06.03]   Discharge Diagnosis  Acute pulmonary edema (HCC) [J81.0] Respiratory distress [R06.03]    Active Problems:   Pulmonary edema      Past Medical History:  Diagnosis Date  . COPD (chronic obstructive pulmonary disease) (HCC)   . Cryptogenic organizing pneumonia (HCC)   . End stage renal disease (HCC)   . Essential hypertension   . Restrictive lung disease     Past Surgical History:  Procedure Laterality Date  . AV fistula right lower arm Right        History of present illness and  Hospital Course:     Kindly see H&P for history of present illness and admission details, please review complete Labs, Consult reports and Test reports for all details in brief  HPI  from the history and physical done on the day of admission 52 year old female patient with history of ESRD on hemodialysis, hypertension, COPD, interstitial lung disease came in this morning for shortness of breath, patient also had tachypnea admitted because of pulmonary edema.   Hospital Course  #1 pulmonary edema secondary to fluid overload: Patient told me that he ate outside yesterday which she consumed more salt and felt short of breath because of fluid retention: Patient is due for dialysis today, patient has a history of ESRD. 2.  ESRD on hemodialysis Tuesday, Thursday, Saturday, continue renal diet, patient is going to go for dialysis now.  Patient received  a dose of Lasix in the emergency room and she feels much better and wants to go home of dialysis today. 3.  COPD, interstitial lung disease: Continue prednisone per home regimen. Patient can continue Sensipar but she was taking before. 4.  Essential hypertension: Patient is on Coreg, nifedipine at home.  BP controlled. 5.  Chronic respiratory failure, patient has oxygen 2 L at home.    Discharge Condition: full  Follow UP  Follow-up Information    Maida Sale, MD. Schedule an appointment as soon as possible for a visit in 1 week(s).   Specialty:  Family Medicine Contact information: [address] [place] [address] [phone]             Discharge Instructions  and  Discharge Medications      Allergies as of 05/06/2017      Reactions   Iron Anaphylaxis   Peanuts [peanut Oil] Other (See Comments)   Reaction:  Unknown    Penicillins Shortness Of Breath, Other (See Comments)   Has patient had a PCN reaction causing immediate rash, facial/tongue/throat swelling, SOB or lightheadedness with hypotension: Yes Has patient had a PCN reaction causing severe rash involving mucus membranes or skin necrosis: No Has patient had a PCN reaction that required hospitalization No Has patient had a PCN reaction occurring within the last 10 years: No If all of the above answers are "NO", then may proceed with Cephalosporin use.   Potassium-containing Compounds Shortness Of Breath, Other (See Comments)   Dialysis solution with 4K (per record).    Septra [sulfamethoxazole-trimethoprim] Shortness Of Breath   Skelaxin [metaxalone] Shortness Of Breath   Other  HONEY   Erythromycin Rash   Iodine Other (See Comments)   Reaction:  Unknown       Medication List    STOP taking these medications   traMADol 50 MG tablet Commonly known as:  ULTRAM     TAKE these medications   albuterol 108 (90 Base) MCG/ACT inhaler Commonly known as:  PROVENTIL HFA;VENTOLIN HFA Inhale 1-2 puffs  into the lungs every 6 (six) hours as needed for wheezing.   calcium carbonate 500 MG chewable tablet Commonly known as:  TUMS - dosed in mg elemental calcium Chew 2 tablets by mouth 3 (three) times daily with meals.   carvedilol 25 MG tablet Commonly known as:  COREG Take 25 mg by mouth 2 (two) times daily.   cinacalcet 30 MG tablet Commonly known as:  SENSIPAR Take 120 mg by mouth at bedtime.   NIFEdipine 90 MG 24 hr tablet Commonly known as:  PROCARDIA XL/ADALAT-CC Take 90 mg by mouth daily. Monday, Wednesday, Friday morning pt takes 90 mg, with carvedilol 25mg . Monday, Wednesday, Friday night pt takes 60mg  alone. Tuesday, Thursday Saturday, and Sunday Takes 90mg , Twice daily along with Carvedilol 25 mg twice daily.   predniSONE 5 MG tablet Commonly known as:  DELTASONE Take 5 mg by mouth daily. What changed:  Another medication with the same name was removed. Continue taking this medication, and follow the directions you see here.         Diet and Activity recommendation: See Discharge Instructions above   Consults obtained -nephrology   Major procedures and Radiology Reports - PLEASE review detailed and final reports for all details, in brief -      Dg Chest 2 View  Result Date: 05/02/2017 CLINICAL DATA:  Decreased blood pressure. Shortness of breath. Dialysis patient. EXAM: CHEST - 2 VIEW COMPARISON:  02/13/2015 FINDINGS: Hyperinflation. 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 Right subclavian stent. Midline trachea. Marked cardiomegaly. No pleural effusion or pneumothorax. Mild interstitial prominence. No lobar consolidation. IMPRESSION: Cardiomegaly and mild pulmonary venous congestion. Electronically Signed   By: Jeronimo Greaves M.D.   On: 05/02/2017 12:12   Dg Chest Port 1 View  Result Date: 05/06/2017 CLINICAL DATA:  Respiratory distress EXAM: PORTABLE CHEST 1 VIEW COMPARISON:  05/02/2017 FINDINGS: Prominent diffuse  cardiac enlargement. Pulmonary vascular congestion. Hazy infiltrates over the lungs likely representing edema. There appears to be progression since the previous study. Blunting of the costophrenic angles suggesting small pleural effusions. Calcification of the aorta. No pneumothorax. Vascular graft in the right subclavian region. Old bilateral rib fracture deformities. IMPRESSION: Cardiac enlargement with increasing vascular congestion and edema. Small bilateral pleural effusions. Progression of CHF changes. Electronically Signed   By: Burman Nieves M.D.   On: 05/06/2017 05:43    Micro Results     No results found for this or any previous visit (from the past 240 hour(s)).     Today   Subjective:   Brittany Ramirez today has no headache,no chest abdominal pain,no new weakness tingling or numbness, feels much better wants to go home today.  Objective:   Blood pressure (!) 153/96, pulse 96, temperature 98.2 F (36.8 C), temperature source Oral, resp. rate 20, height 5' (1.524 m), weight 58.6 kg (129 lb 3 oz), SpO2 90 %.  No intake or output data in the 24 hours ending 05/06/17 0941  Exam Awake Alert, Oriented x 3, No new F.N deficits, Normal affect [place].AT,PERRAL Supple Neck,No JVD, No cervical lymphadenopathy appriciated.  Symmetrical Chest wall movement, Good air movement bilaterally, CTAB RRR,No Gallops,Rubs or new Murmurs, No Parasternal Heave +ve B.Sounds, Abd Soft, Non tender, No organomegaly appriciated, No rebound -guarding or rigidity. No Cyanosis, Clubbing or edema, No new Rash or bruise  Data Review   CBC w Diff:  Lab Results  Component Value Date   WBC 4.6 05/06/2017   HGB 12.2 05/06/2017   HCT 37.2 05/06/2017   PLT 130 (L) 05/06/2017   LYMPHOPCT 18 05/06/2017   MONOPCT 7 05/06/2017   EOSPCT 2 05/06/2017   BASOPCT 0 05/06/2017    CMP:  Lab Results  Component Value Date   NA 141 05/06/2017   K 4.5 05/06/2017   CL 101 05/06/2017   CO2 27 05/06/2017   BUN  38 (H) 05/06/2017   CREATININE 7.93 (H) 05/06/2017   PROT 7.3 05/02/2017   ALBUMIN 3.7 05/02/2017   BILITOT 0.8 05/02/2017   ALKPHOS 288 (H) 05/02/2017   AST 17 05/02/2017   ALT 11 (L) 05/02/2017  .   Total Time in preparing paper work, data evaluation and todays exam - 35 minutes  Katha HammingSnehalatha Antwyne Pingree M.D on 05/06/2017 at 9:41 AM    Note: This dictation was prepared with Dragon dictation along with smaller phrase technology. Any transcriptional errors that result from this process are unintentional.

## 2017-05-06 NOTE — Care Management CC44 (Signed)
Condition Code 44 Documentation Completed  Patient Details  Name: Brittany Ramirez MRN: [id number] Date of Birth: 03/11/1965   Condition Code 44 given:  Yes Patient signature on Condition Code 44 notice:  Yes Documentation of 2 MD's agreement:  Yes Code 44 added to claim:  Yes   Informed patient has been changed to observation but at time of this note, order has not been entered   Eber HongGreene, Yajayra Feldt R, RN 05/06/2017, 4:26 PM

## 2017-05-06 NOTE — Progress Notes (Signed)
Discharge today after hemodialysis.

## 2017-05-06 NOTE — Care Management (Signed)
Brittany Ramirez dialysis liaison notified of admission.    

## 2017-05-06 NOTE — H&P (Signed)
Brittany Ramirez is an 52 y.o. female.   Chief Complaint: Shortness of breath HPI: Patient with past medical history of end-stage renal disease on dialysis, hypertension, COPD and interstitial lung disease presents to the emergency department complaining of shortness of breath.  The patient states that she has been mildly tachypneic today but became acutely more short of breath this evening.  She required 3 L of oxygen via nasal cannula to maintain oxygen saturations of 97%.  Chest x-ray showed pulmonary edema.  The patient was given Lasix and felt better (although she does not make urine anymore).  Nephrology was contacted and suggested early dialysis this morning.  Thus the emergency department staff called the hospitalist service for admission.  Past Medical History:  Diagnosis Date  . COPD (chronic obstructive pulmonary disease) ([place])   . Cryptogenic organizing pneumonia ([place])   . End stage renal disease ([place])   . Essential hypertension   . Restrictive lung disease     Past Surgical History:  Procedure Laterality Date  . AV fistula right lower arm Right     Family History  Problem Relation Age of Onset  . Diabetes Sister    Social History:  reports that she has quit smoking. Her smoking use included cigarettes. She has never used smokeless tobacco. She reports that she drinks alcohol. Her drug history is not on file.  Allergies:  Allergies  Allergen Reactions  . Iron Anaphylaxis  . Peanuts [Peanut Oil] Other (See Comments)    Reaction:  Unknown   . Penicillins Shortness Of Breath and Other (See Comments)    Has patient had a PCN reaction causing immediate rash, facial/tongue/throat swelling, SOB or lightheadedness with hypotension: Yes Has patient had a PCN reaction causing severe rash involving mucus membranes or skin necrosis: No Has patient had a PCN reaction that required hospitalization No Has patient had a PCN reaction occurring within the last 10 years: No If all of  the above answers are "NO", then may proceed with Cephalosporin use.  Marland Kitchen Potassium-Containing Compounds Shortness Of Breath and Other (See Comments)    Dialysis solution with 4K (per record).   Sarina Ill [Sulfamethoxazole-Trimethoprim] Shortness Of Breath  . Skelaxin [Metaxalone] Shortness Of Breath  . Erythromycin Rash  . Iodine Other (See Comments)    Reaction:  Unknown     Medications Prior to Admission  Medication Sig Dispense Refill  . albuterol (PROVENTIL HFA;VENTOLIN HFA) 108 (90 Base) MCG/ACT inhaler Inhale 1-2 puffs into the lungs every 6 (six) hours as needed for wheezing. 1 Inhaler 0  . calcium carbonate (TUMS - DOSED IN MG ELEMENTAL CALCIUM) 500 MG chewable tablet Chew 2 tablets by mouth 3 (three) times daily with meals.     . carvedilol (COREG) 25 MG tablet Take 25 mg by mouth 2 (two) times daily.    . cinacalcet (SENSIPAR) 30 MG tablet Take 120 mg by mouth at bedtime.     Marland Kitchen NIFEdipine (PROCARDIA XL/ADALAT-CC) 90 MG 24 hr tablet Take 90 mg by mouth daily. Monday, Wednesday, Friday morning pt takes 90 mg, with carvedilol 51m. Monday, Wednesday, Friday night pt takes 632malone. Tuesday, Thursday Saturday, and Sunday Takes 9083mTwice daily along with Carvedilol 25 mg twice daily.    . predniSONE (DELTASONE) 5 MG tablet Take 5 mg by mouth daily.     . predniSONE (DELTASONE) 10 MG tablet Take 1 tablet (10 mg total) by mouth daily with breakfast. 40 mg PO (by mouth) x 1 days 30 mg PO x  2 days 20 mg PO x 2 days the resume  10 mg PO daily (Patient not taking: Reported on 05/06/2017) 20 tablet 0  . traMADol (ULTRAM) 50 MG tablet Take 1 tablet (50 mg total) by mouth every 12 (twelve) hours as needed for moderate pain (Do not drive or operate machinery while taking as can cause drowsiness.). (Patient not taking: Reported on 05/06/2017) 8 tablet 0    Results for orders placed or performed during the hospital encounter of 05/06/17 (from the past 48 hour(s))  CBC with Differential     Status:  Abnormal   Collection Time: 05/06/17  5:16 AM  Result Value Ref Range   WBC 4.6 3.6 - 11.0 K/uL   RBC 4.36 3.80 - 5.20 MIL/uL   Hemoglobin 12.2 12.0 - 16.0 g/dL   HCT 37.2 35.0 - 47.0 %   MCV 85.4 80.0 - 100.0 fL   MCH 27.9 26.0 - 34.0 pg   MCHC 32.7 32.0 - 36.0 g/dL   RDW 20.0 (H) 11.5 - 14.5 %   Platelets 130 (L) 150 - 440 K/uL   Neutrophils Relative % 73 %   Neutro Abs 3.3 1.4 - 6.5 K/uL   Lymphocytes Relative 18 %   Lymphs Abs 0.8 (L) 1.0 - 3.6 K/uL   Monocytes Relative 7 %   Monocytes Absolute 0.3 0.2 - 0.9 K/uL   Eosinophils Relative 2 %   Eosinophils Absolute 0.1 0 - 0.7 K/uL   Basophils Relative 0 %   Basophils Absolute 0.0 0 - 0.1 K/uL    Comment: Performed at [facility], [address] [place], [place] [address]  Basic metabolic panel     Status: Abnormal   Collection Time: 05/06/17  5:16 AM  Result Value Ref Range   Sodium 141 135 - 145 mmol/L   Potassium 4.5 3.5 - 5.1 mmol/L    Comment: HEMOLYSIS AT THIS LEVEL MAY AFFECT RESULT   Chloride 101 101 - 111 mmol/L   CO2 27 22 - 32 mmol/L   Glucose, Bld 78 65 - 99 mg/dL   BUN 38 (H) 6 - 20 mg/dL   Creatinine, Ser 7.93 (H) 0.44 - 1.00 mg/dL   Calcium 7.6 (L) 8.9 - 10.3 mg/dL   GFR calc non Af Amer 5 (L) >60 mL/min   GFR calc Af Amer 6 (L) >60 mL/min    Comment: (NOTE) The eGFR has been calculated using the CKD EPI equation. This calculation has not been validated in all clinical situations. eGFR's persistently <60 mL/min signify possible Chronic Kidney Disease.    Anion gap 13 5 - 15    Comment: Performed at [facility], [address] [place], [place] [address]  Troponin I     Status: None   Collection Time: 05/06/17  5:16 AM  Result Value Ref Range   Troponin I <0.03 <0.03 ng/mL    Comment: Performed at [facility], [address] [place], [place] [address]   Dg Chest Port 1 View  Result Date: 05/06/2017 CLINICAL DATA:  Respiratory distress EXAM: PORTABLE CHEST 1 VIEW  COMPARISON:  05/02/2017 FINDINGS: Prominent diffuse cardiac enlargement. Pulmonary vascular congestion. Hazy infiltrates over the lungs likely representing edema. There appears to be progression since the previous study. Blunting of the costophrenic angles suggesting small pleural effusions. Calcification of the aorta. No pneumothorax. Vascular graft in the right subclavian region. Old bilateral rib fracture deformities. IMPRESSION: Cardiac enlargement with increasing vascular congestion and edema. Small bilateral pleural effusions. Progression of  CHF changes. Electronically Signed   By: Lucienne Capers M.D.   On: 05/06/2017 05:43    Review of Systems  Constitutional: Negative for chills and fever.  HENT: Negative for sore throat and tinnitus.   Eyes: Negative for blurred vision and redness.  Respiratory: Positive for shortness of breath. Negative for cough.   Cardiovascular: Negative for chest pain, palpitations, orthopnea and PND.  Gastrointestinal: Negative for abdominal pain, diarrhea, nausea and vomiting.  Genitourinary: Negative for dysuria, frequency and urgency.  Musculoskeletal: Negative for joint pain and myalgias.  Skin: Negative for rash.       No lesions  Neurological: Negative for speech change, focal weakness and weakness.  Endo/Heme/Allergies: Does not bruise/bleed easily.       No temperature intolerance  Psychiatric/Behavioral: Negative for depression and suicidal ideas.    Blood pressure (!) 146/95, pulse 90, temperature 98 F (36.7 C), temperature source Oral, resp. rate 19, height 5' (1.524 m), weight 55.8 kg (123 lb), SpO2 94 %. Physical Exam  Vitals reviewed. Constitutional: She is oriented to person, place, and time. She appears well-developed and well-nourished. No distress.  HENT:  Head: Normocephalic and atraumatic.  Mouth/Throat: Oropharynx is clear and moist.  Eyes: Pupils are equal, round, and reactive to light. Conjunctivae and EOM are normal. No scleral  icterus.  Neck: Normal range of motion. Neck supple. No JVD present. No tracheal deviation present. No thyromegaly present.  Cardiovascular: Normal rate, regular rhythm and normal heart sounds. Exam reveals no gallop and no friction rub.  No murmur heard. Respiratory: Effort normal and breath sounds normal.  GI: Soft. Bowel sounds are normal. She exhibits no distension. There is no tenderness.  Genitourinary:  Genitourinary Comments: Deferred  Musculoskeletal: Normal range of motion. She exhibits no edema.  Lymphadenopathy:    She has no cervical adenopathy.  Neurological: She is alert and oriented to person, place, and time. No cranial nerve deficit. She exhibits normal muscle tone.  Skin: Skin is warm and dry. No rash noted. No erythema.  Psychiatric: She has a normal mood and affect. Her behavior is normal. Judgment and thought content normal.     Assessment/Plan This is a 52 year old female admitted for pulmonary edema. 1.  Pulmonary edema: Secondary to fluid overload.  We will dialyze this morning per routine dialysis schedule.  Respiratory rate has improved.  (The patient does not use oxygen at home dose.. 2.  End-stage renal disease: The patient is dialysis Tuesday, Thursday, Saturday.  Continue renal diet with fluid restriction.  Continue Sensipar and Tums.  Nephrology to see. 3.  Hypertension: Acceptable given circumstances and history of malignant hypertension; continue carvedilol and nifedipine 4.  COPD/interstitial lung disease: Continue prednisone per home regimen.  Albuterol as needed. 5.  DVT prophylaxis: Heparin 6.  GI prophylaxis: None The patient is a full code.  Time spent on admission orders and patient care approximately 45 minutes  Harrie Foreman, MD 05/06/2017, 6:00 AM

## 2017-05-06 NOTE — Progress Notes (Signed)
05/06/2017 4:49 PM  Paulita Fujitaamille Johnson Aune to be D/C'd Home per MD order.  Discussed prescriptions and follow up appointments with the patient. Prescriptions given to patient, medication list explained in detail. Pt verbalized understanding.  Allergies as of 05/06/2017      Reactions   Iron Anaphylaxis   Peanuts [peanut Oil] Other (See Comments)   Reaction:  Unknown    Penicillins Shortness Of Breath, Other (See Comments)   Has patient had a PCN reaction causing immediate rash, facial/tongue/throat swelling, SOB or lightheadedness with hypotension: Yes Has patient had a PCN reaction causing severe rash involving mucus membranes or skin necrosis: No Has patient had a PCN reaction that required hospitalization No Has patient had a PCN reaction occurring within the last 10 years: No If all of the above answers are "NO", then may proceed with Cephalosporin use.   Potassium-containing Compounds Shortness Of Breath, Other (See Comments)   Dialysis solution with 4K (per record).    Septra [sulfamethoxazole-trimethoprim] Shortness Of Breath   Skelaxin [metaxalone] Shortness Of Breath   Other    HONEY   Erythromycin Rash   Iodine Other (See Comments)   Reaction:  Unknown       Medication List    STOP taking these medications   traMADol 50 MG tablet Commonly known as:  ULTRAM     TAKE these medications   albuterol 108 (90 Base) MCG/ACT inhaler Commonly known as:  PROVENTIL HFA;VENTOLIN HFA Inhale 1-2 puffs into the lungs every 6 (six) hours as needed for wheezing.   calcium carbonate 500 MG chewable tablet Commonly known as:  TUMS - dosed in mg elemental calcium Chew 2 tablets by mouth 3 (three) times daily with meals.   carvedilol 25 MG tablet Commonly known as:  COREG Take 25 mg by mouth 2 (two) times daily.   cinacalcet 30 MG tablet Commonly known as:  SENSIPAR Take 120 mg by mouth at bedtime.   NIFEdipine 90 MG 24 hr tablet Commonly known as:  PROCARDIA XL/ADALAT-CC Take 90  mg by mouth daily. Monday, Wednesday, Friday morning pt takes 90 mg, with carvedilol 25mg . Monday, Wednesday, Friday night pt takes 60mg  alone. Tuesday, Thursday Saturday, and Sunday Takes 90mg , Twice daily along with Carvedilol 25 mg twice daily.   predniSONE 5 MG tablet Commonly known as:  DELTASONE Take 5 mg by mouth daily. What changed:  Another medication with the same name was removed. Continue taking this medication, and follow the directions you see here.       Vitals:   05/06/17 1350 05/06/17 1520  BP: (!) 171/98 (!) 161/101  Pulse: 88 93  Resp: (!) 27 18  Temp:  99.7 F (37.6 C)  SpO2: 90% 95%    Skin clean, dry and intact without evidence of skin break down, no evidence of skin tears noted. IV catheter discontinued intact. Site without signs and symptoms of complications. Dressing and pressure applied. Pt denies pain at this time. No complaints noted.  An After Visit Summary was printed and given to the patient. Patient escorted via WC, and D/C home via private auto.  Bradly Chrisougherty, Chayna Surratt E

## 2017-05-06 NOTE — Progress Notes (Signed)
[facility]  ROUNDING NOTE   Subjective:   [name] admitted to [facility] on 05/06/2017 for Acute pulmonary edema (HCC) [J81.0] Respiratory distress [R06.03]  Patient states she went out to eat yesterday and drank too much soda.   Placed on hemodialysis due to volume overload and pulmonary edema.     HEMODIALYSIS FLOWSHEET:  Blood Flow Rate (mL/min): 400 mL/min Arterial Pressure (mmHg): -140 mmHg Venous Pressure (mmHg): 250 mmHg Transmembrane Pressure (mmHg): 60 mmHg Ultrafiltration Rate (mL/min): 1.3 mL/min Dialysate Flow Rate (mL/min): 600 ml/min Conductivity: Machine : 14.3 Conductivity: Machine : 14.3 Dialysis Fluid Bolus: Normal Saline Bolus Amount (mL): 200 mL    Objective:  Vital signs in last 24 hours:  Temp:  [98 F (36.7 C)-99.2 F (37.3 C)] 99.2 F (37.3 C) (04/04 1030) Pulse Rate:  [79-98] 90 (04/04 1245) Resp:  [19-34] 19 (04/04 1245) BP: (141-170)/(92-113) 164/97 (04/04 1245) SpO2:  [90 %-100 %] 100 % (04/04 1245) Weight:  [55.8 kg (123 lb)-58.6 kg (129 lb 3 oz)] 58.4 kg (128 lb 12 oz) (04/04 1030)  Weight change:  Filed Weights   05/06/17 0510 05/06/17 0651 05/06/17 1030  Weight: 55.8 kg (123 lb) 58.6 kg (129 lb 3 oz) 58.4 kg (128 lb 12 oz)    Intake/Output: No intake/output data recorded.   Intake/Output this shift:  No intake/output data recorded.  Physical Exam: General: NAD, sitting up in bed  Head: Normocephalic, atraumatic. Moist oral mucosal membranes  Eyes: Anicteric, PERRL  Neck: Supple, trachea midline  Lungs:  Bibasilar crackles  Heart: Regular rate and rhythm  Abdomen:  Soft, nontender,   Extremities: trace peripheral edema.  Neurologic: Nonfocal, moving all four extremities  Skin: No lesions  Access: Right forearm    Basic Metabolic Panel: Recent Labs  Lab 05/02/17 1444 05/06/17 0516  NA 138 141  K 3.8 4.5  CL 97* 101  CO2 28 27  GLUCOSE 111* 78  BUN 14 38*  CREATININE 5.68* 7.93*   CALCIUM 8.4* 7.6*    Liver Function Tests: Recent Labs  Lab 05/02/17 1444  AST 17  ALT 11*  ALKPHOS 288*  BILITOT 0.8  PROT 7.3  ALBUMIN 3.7   No results for input(s): LIPASE, AMYLASE in the last 168 hours. No results for input(s): AMMONIA in the last 168 hours.  CBC: Recent Labs  Lab 05/02/17 1444 05/06/17 0516  WBC 6.0 4.6  NEUTROABS 5.1 3.3  HGB 12.1 12.2  HCT 37.5 37.2  MCV 84.5 85.4  PLT 135* 130*    Cardiac Enzymes: Recent Labs  Lab 05/06/17 0516  TROPONINI <0.03    BNP: Invalid input(s): POCBNP  CBG: No results for input(s): GLUCAP in the last 168 hours.  Microbiology: Results for orders placed or performed during the hospital encounter of 02/12/15  MRSA PCR Screening     Status: None   Collection Time: 02/13/15  3:22 PM  Result Value Ref Range Status   MRSA by PCR NEGATIVE NEGATIVE Final    Comment:        The GeneXpert MRSA Assay (FDA approved for NASAL specimens only), is one component of a comprehensive MRSA colonization surveillance program. It is not intended to diagnose MRSA infection nor to guide or monitor treatment for MRSA infections.     Coagulation Studies: No results for input(s): LABPROT, INR in the last 72 hours.  Urinalysis: No results for input(s): COLORURINE, LABSPEC, PHURINE, GLUCOSEU, HGBUR, BILIRUBINUR, KETONESUR, PROTEINUR, UROBILINOGEN, NITRITE, LEUKOCYTESUR in the last 72 hours.  Invalid  input(s): APPERANCEUR    Imaging: Dg Chest Port 1 View  Result Date: 05/06/2017 CLINICAL DATA:  Respiratory distress EXAM: PORTABLE CHEST 1 VIEW COMPARISON:  05/02/2017 FINDINGS: Prominent diffuse cardiac enlargement. Pulmonary vascular congestion. Hazy infiltrates over the lungs likely representing edema. There appears to be progression since the previous study. Blunting of the costophrenic angles suggesting small pleural effusions. Calcification of the aorta. No pneumothorax. Vascular graft in the right subclavian region. Old  bilateral rib fracture deformities. IMPRESSION: Cardiac enlargement with increasing vascular congestion and edema. Small bilateral pleural effusions. Progression of CHF changes. Electronically Signed   By: Burman NievesWilliam  Stevens M.D.   On: 05/06/2017 05:43     Medications:    . docusate sodium  100 mg Oral BID  . heparin  5,000 Units Subcutaneous Q8H   acetaminophen **OR** acetaminophen, albuterol, ondansetron **OR** ondansetron (ZOFRAN) IV  Assessment/ Plan:  [name] is a 52 y.o. black female with end stage renal disease on hemodialysis, history of renal transplant, transplant nephrectomy 2007, interstitial lung disease, pulmonary hypertension, left hip necrosis   TTS [facility] right forearm AVF EDW 58kg   1. End Stage Renal Disease with volume overload and pulmonary edema. Extra dialysis treatment today. Last hemodialysis treatment was Tuesday.  - Seen and examined on emergent hemodialysis treatment today.  - Ultrafiltration goal of 3 liters - Scheduled for an extra dialysis treatment for tomorrow as outpatient.   2. Hypertension: home regimen of carvedilol and nifedipine. Elevated on hemodialysis treatment. Volume overloaded.   3. Anemia fo chronic kidney disease: hemoglobin 12.2 - Mircera as outpatient.   4. Secondary Hyperparathyroidism: PTH on 3/21 of 1283. Phosphorus and calcium at goal. - Calcitriol 1.6225mcg daily - Tums with meals.     LOS: 0 Analyssa Downs 4/4/201912:53 PM

## 2017-05-06 NOTE — ED Triage Notes (Signed)
Pt arrived from home via EMS with complaints of respiratory distress. Pt was sleeping when she woke up feeling SOB. Pt placed herself on her normal 3L nasal cannula. Pt is a dialysis patient. Pt stated that for the past few weeks she has had to have more fluid than normal pulled off her. VS per EMS BP-160/110 HR-98 O2sat-97% on 3L. Pt is alet and oriented x 4.

## 2017-05-06 NOTE — Progress Notes (Signed)
05/06/2017 3:42 PM  Pt told me that she took her own 90mg  procardia and 25mg  carvedilol from home supply when I was out of the room because "my blood pressure is high."  These meds and these dosages are on pt's home med list.  Explained the policy on home medications and that pts should not take their own supply while admitted to the hospital.  Pt expressed understanding.  Notified attending MD Luberta MutterKonidena that pt had taken these meds.  No further intervention ordered ior requested at this time, instructed to proceed with discharge as planned.  Bradly Chrisougherty, Newman Waren E, RN

## 2017-05-06 NOTE — ED Provider Notes (Signed)
[facility] Emergency Department Provider Note   First MD Initiated Contact with Patient 05/06/17 [phone]     (approximate)  I have reviewed the triage vital signs and the nursing notes.   HISTORY  Chief Complaint Respiratory Distress    HPI Brittany Ramirez is a 52 y.o. female with below list of chronic medical conditions including end-stage renal disease receiving dialysis Tuesday Thursday and Saturday with no missed dialysis to the emergency department via EMS with acute onset of dyspnea which woke the patient from sleep this morning.  EMS states on their arrival patient had applied her baseline of 3 L nasal cannula with oxygen saturation 90%.  Patient states that dyspnea is improved however patient remains tachypneic with a respiratory rate of 34 at present with apparent respiratory difficulty.   Past Medical History:  Diagnosis Date  . COPD (chronic obstructive pulmonary disease) (HCC)   . Cryptogenic organizing pneumonia (HCC)   . End stage renal disease (HCC)   . Essential hypertension   . Restrictive lung disease     Patient Active Problem List   Diagnosis Date Noted  . Pulmonary edema 05/06/2017  . COPD exacerbation (HCC) 02/13/2015  . Acute respiratory failure (HCC) 02/13/2015  . Pneumonia 02/13/2015  . CHF (congestive heart failure) (HCC) 02/12/2015    Past Surgical History:  Procedure Laterality Date  . AV fistula right lower arm Right     Prior to Admission medications   Medication Sig Start Date End Date Taking? Authorizing Provider  albuterol (PROVENTIL HFA;VENTOLIN HFA) 108 (90 Base) MCG/ACT inhaler Inhale 1-2 puffs into the lungs every 6 (six) hours as needed for wheezing. 05/02/17  Yes Rolland PorterJames, Mark, MD  calcium carbonate (TUMS - DOSED IN MG ELEMENTAL CALCIUM) 500 MG chewable tablet Chew 2 tablets by mouth 3 (three) times daily with meals.    Yes [provider]  carvedilol (COREG) 25 MG tablet Take 25 mg by mouth 2  (two) times daily.   Yes [provider]  cinacalcet (SENSIPAR) 30 MG tablet Take 120 mg by mouth at bedtime.    Yes [provider]  NIFEdipine (PROCARDIA XL/ADALAT-CC) 90 MG 24 hr tablet Take 90 mg by mouth daily. Monday, Wednesday, Friday morning pt takes 90 mg, with carvedilol 25mg . Monday, Wednesday, Friday night pt takes 60mg  alone. Tuesday, Thursday Saturday, and Sunday Takes 90mg , Twice daily along with Carvedilol 25 mg twice daily.   Yes [provider]  predniSONE (DELTASONE) 5 MG tablet Take 5 mg by mouth daily.    Yes [provider]  predniSONE (DELTASONE) 10 MG tablet Take 1 tablet (10 mg total) by mouth daily with breakfast. 40 mg PO (by mouth) x 1 days 30 mg PO x 2 days 20 mg PO x 2 days the resume  10 mg PO daily Patient not taking: Reported on 05/06/2017 02/15/15   [name], [name], MD  traMADol (ULTRAM) 50 MG tablet Take 1 tablet (50 mg total) by mouth every 12 (twelve) hours as needed for moderate pain (Do not drive or operate machinery while taking as can cause drowsiness.). Patient not taking: Reported on 05/06/2017 04/14/16   [name], [name], NP    Allergies Iron; Peanuts [peanut oil]; Penicillins; Potassium-containing compounds; Septra [sulfamethoxazole-trimethoprim]; Skelaxin [metaxalone]; Other; Erythromycin; and Iodine  Family History  Problem Relation Age of Onset  . Diabetes Sister     Social History Social History   Tobacco Use  . Smoking status: Former Smoker    Types: Cigarettes  .  Smokeless tobacco: Never Used  Substance Use Topics  . Alcohol use: Yes    Comment: occas.   . Drug use: Not on file    Review of Systems Constitutional: No fever/chills Eyes: No visual changes. ENT: No sore throat. Cardiovascular: Denies chest pain. Respiratory: Positive for dyspnea Gastrointestinal: No abdominal pain.  No nausea, no vomiting.  No diarrhea.  No constipation. Genitourinary: Negative for dysuria. Musculoskeletal: Negative for  neck pain.  Negative for back pain. Integumentary: Negative for rash. Neurological: Negative for headaches, focal weakness or numbness.   ____________________________________________   PHYSICAL EXAM:  VITAL SIGNS: ED Triage Vitals  Enc Vitals Group     BP 05/06/17 0516 (!) 166/106     Pulse Rate 05/06/17 0516 97     Resp 05/06/17 0516 (!) 34     Temp 05/06/17 0516 98 F (36.7 C)     Temp Source 05/06/17 0516 Oral     SpO2 05/06/17 0516 98 %     Weight 05/06/17 0510 55.8 kg (123 lb)     Height 05/06/17 0510 1.524 m (5')     Head Circumference --      Peak Flow --      Pain Score 05/06/17 0509 0     Pain Loc --      Pain Edu? --      Excl. in GC? --     Constitutional: Alert and oriented.  Apparent respiratory difficulty  eyes: Conjunctivae are normal.  Head: Atraumatic. Mouth/Throat: Mucous membranes are moist.  Oropharynx non-erythematous. Neck: No stridor.   Cardiovascular: Normal rate, regular rhythm. Good peripheral circulation. Grossly normal heart sounds. Respiratory: Tachypnea, positive accessory respiratory muscle use, bibasilar rales Gastrointestinal: Soft and nontender. No distention.  Musculoskeletal: No lower extremity tenderness nor edema. No gross deformities of extremities. Neurologic:  Normal speech and language. No gross focal neurologic deficits are appreciated.  Skin:  Skin is warm, dry and intact. No rash noted. Psychiatric: Mood and affect are normal. Speech and behavior are normal.  ____________________________________________   LABS (all labs ordered are listed, but only abnormal results are displayed)  Labs Reviewed  CBC WITH DIFFERENTIAL/PLATELET - Abnormal; Notable for the following components:      Result Value   RDW 20.0 (*)    Platelets 130 (*)    Lymphs Abs 0.8 (*)    All other components within normal limits  BASIC METABOLIC PANEL - Abnormal; Notable for the following components:   BUN 38 (*)    Creatinine, Ser 7.93 (*)     Calcium 7.6 (*)    GFR calc non Af Amer 5 (*)    GFR calc Af Amer 6 (*)    All other components within normal limits  BRAIN NATRIURETIC PEPTIDE - Abnormal; Notable for the following components:   B Natriuretic Peptide 524.0 (*)    All other components within normal limits  TROPONIN I   ____________________________________________  EKG  ED ECG REPORT I, [place] N BROWN, the attending physician, personally viewed and interpreted this ECG.   Date: 05/06/2017  EKG Time: 5:14 AM  Rate: 98  Rhythm: Normal sinus rhythm  Axis: Normal  Intervals: Normal  ST&T Change: None ______________________________________  RADIOLOGY I, [place] N BROWN, personally viewed and evaluated these images (plain radiographs) as part of my medical decision making, as well as reviewing the written report by the radiologist.  ED MD interpretation: Cardiomegaly with vascular congestion and pulmonary edema.  Official radiology report(s): Dg Chest [facility] 1 View  Result  Date: 05/06/2017 CLINICAL DATA:  Respiratory distress EXAM: PORTABLE CHEST 1 VIEW COMPARISON:  05/02/2017 FINDINGS: Prominent diffuse cardiac enlargement. Pulmonary vascular congestion. Hazy infiltrates over the lungs likely representing edema. There appears to be progression since the previous study. Blunting of the costophrenic angles suggesting small pleural effusions. Calcification of the aorta. No pneumothorax. Vascular graft in the right subclavian region. Old bilateral rib fracture deformities. IMPRESSION: Cardiac enlargement with increasing vascular congestion and edema. Small bilateral pleural effusions. Progression of CHF changes. Electronically Signed   By: Burman Nieves M.D.   On: 05/06/2017 05:43      .Critical Care Performed by: Darci Current, MD Authorized by: Darci Current, MD   Critical care provider statement:    Critical care time (minutes):  30   Critical care time was exclusive of:  Separately billable procedures  and treating other patients and teaching time   Critical care was necessary to treat or prevent imminent or life-threatening deterioration of the following conditions:  Respiratory failure   Critical care was time spent personally by me on the following activities:  Development of treatment plan with patient or surrogate, discussions with consultants, evaluation of patient's response to treatment, examination of patient, obtaining history from patient or surrogate, ordering and performing treatments and interventions, ordering and review of laboratory studies, ordering and review of radiographic studies, pulse oximetry, re-evaluation of patient's condition and review of old charts   I assumed direction of critical care for this patient from another provider in my specialty: no       ____________________________________________   INITIAL IMPRESSION / ASSESSMENT AND PLAN / ED COURSE  As part of my medical decision making, I reviewed the following data within the electronic MEDICAL RECORD NUMBER   52 year old female presented with above-stated history and physical exam consistent with acute pulmonary edema.  Patient with apparent rest or distress Lasix 80 mg IV given with improvement of respiratory effort and rate.  Patient discussed with Dr. Wynelle Link nephrologist on call for emergent dialysis.  Patient also discussed with Dr. Sheryle Hail hospitalist on call for hospital admission. ____________________________________________  FINAL CLINICAL IMPRESSION(S) / ED DIAGNOSES  Final diagnoses:  Respiratory distress  Acute pulmonary edema (HCC)     MEDICATIONS GIVEN DURING THIS VISIT:  Medications  furosemide (LASIX) injection 80 mg (80 mg Intravenous Given 05/06/17 0530)     ED Discharge Orders    None       Note:  This document was prepared using Dragon voice recognition software and may include unintentional dictation errors.    Darci Current, MD 05/06/17 [id number]

## 2017-05-06 NOTE — Care Management Obs Status (Signed)
MEDICARE OBSERVATION STATUS NOTIFICATION   Patient Details  Name: Brittany Ramirez MRN: [id number] Date of Birth: 04/25/1965   Medicare Observation Status Notification Given:  Yes    Eber HongGreene, Halei Hanover R, RN 05/06/2017, 4:26 PM

## 2017-05-07 DIAGNOSIS — D509 Iron deficiency anemia, unspecified: Secondary | ICD-10-CM | POA: Diagnosis not present

## 2017-05-07 DIAGNOSIS — N2581 Secondary hyperparathyroidism of renal origin: Secondary | ICD-10-CM | POA: Diagnosis not present

## 2017-05-07 DIAGNOSIS — D631 Anemia in chronic kidney disease: Secondary | ICD-10-CM | POA: Diagnosis not present

## 2017-05-07 DIAGNOSIS — N186 End stage renal disease: Secondary | ICD-10-CM | POA: Diagnosis not present

## 2017-05-08 ENCOUNTER — Emergency Department
Admission: EM | Admit: 2017-05-08 | Discharge: 2017-06-02 | Disposition: E | Payer: Medicare Other | Attending: Emergency Medicine | Admitting: Emergency Medicine

## 2017-05-08 DIAGNOSIS — Z79899 Other long term (current) drug therapy: Secondary | ICD-10-CM | POA: Insufficient documentation

## 2017-05-08 DIAGNOSIS — Z87891 Personal history of nicotine dependence: Secondary | ICD-10-CM | POA: Diagnosis not present

## 2017-05-08 DIAGNOSIS — N186 End stage renal disease: Secondary | ICD-10-CM | POA: Insufficient documentation

## 2017-05-08 DIAGNOSIS — J449 Chronic obstructive pulmonary disease, unspecified: Secondary | ICD-10-CM | POA: Insufficient documentation

## 2017-05-08 DIAGNOSIS — I120 Hypertensive chronic kidney disease with stage 5 chronic kidney disease or end stage renal disease: Secondary | ICD-10-CM | POA: Diagnosis not present

## 2017-05-08 DIAGNOSIS — Z91010 Allergy to peanuts: Secondary | ICD-10-CM | POA: Diagnosis not present

## 2017-05-08 DIAGNOSIS — I469 Cardiac arrest, cause unspecified: Secondary | ICD-10-CM | POA: Insufficient documentation

## 2017-05-08 LAB — GLUCOSE, CAPILLARY
GLUCOSE-CAPILLARY: 211 mg/dL — AB (ref 65–99)
Glucose-Capillary: 205 mg/dL — ABNORMAL HIGH (ref 65–99)

## 2017-05-08 MED ORDER — EPINEPHRINE PF 1 MG/10ML IJ SOSY
PREFILLED_SYRINGE | INTRAMUSCULAR | Status: AC | PRN
  Administered 2017-05-08 (×2): 1 via INTRAVENOUS

## 2017-05-08 MED ORDER — SODIUM BICARBONATE 8.4 % IV SOLN
INTRAVENOUS | Status: AC | PRN
  Administered 2017-05-08: 50 meq via INTRAVENOUS

## 2017-05-08 MED ORDER — EPINEPHRINE PF 1 MG/10ML IJ SOSY
PREFILLED_SYRINGE | INTRAMUSCULAR | Status: AC | PRN
Start: 2017-05-08 — End: 2017-05-08
  Administered 2017-05-08: 1 via INTRAVENOUS

## 2017-05-08 MED ORDER — EPINEPHRINE PF 1 MG/10ML IJ SOSY
PREFILLED_SYRINGE | INTRAMUSCULAR | Status: AC | PRN
  Administered 2017-05-08: 1 via INTRAVENOUS

## 2017-05-08 MED ORDER — DEXTROSE 50 % IV SOLN
INTRAVENOUS | Status: AC | PRN
  Administered 2017-05-08 (×2): 1 via INTRAVENOUS

## 2017-05-08 MED ORDER — EPINEPHRINE PF 1 MG/10ML IJ SOSY: PREFILLED_SYRINGE | INTRAMUSCULAR | Status: AC | PRN

## 2017-05-08 MED ORDER — CALCIUM CHLORIDE 10 % IV SOLN
INTRAVENOUS | Status: AC | PRN
  Administered 2017-05-08: 1 g via INTRAVENOUS

## 2017-05-09 MED FILL — Medication: Qty: 1 | Status: AC

## 2017-05-10 LAB — GLUCOSE, CAPILLARY: GLUCOSE-CAPILLARY: 22 mg/dL — AB (ref 65–99)

## 2017-05-26 ENCOUNTER — Ambulatory Visit: Payer: Self-pay | Admitting: Surgery

## 2017-06-02 NOTE — Code Documentation (Signed)
Patient time of death occurred at [id number].

## 2017-06-02 NOTE — ED Notes (Signed)
Zollie Scalelivia- ME on called notified that Dr. Wynelle LinkKolluru is the patient's kidney doctor and would possibly sign death certificate. Waiting to hear back if lines and tubes can be removed.  Additional family member enroute to view patient

## 2017-06-02 NOTE — ED Notes (Addendum)
Patient's sisters: 1. Kemia "Gilles ChiquitoVivian" Johnson [phone] 2. Delores Young [phone] 3. Harriett Laural BenesJohnson [phone]  Cousin: Warner MccreedyChemia Holland [phone]   [facility] once arrangements can be made.

## 2017-06-02 NOTE — Code Documentation (Addendum)
Dr. Don PerkingVeronese used US to assess pulse and check heart compressions resumed

## 2017-06-02 NOTE — ED Notes (Signed)
Pt's 3 sisters in room, Dr. Don PerkingVeronese provided information about patient's death. Chaplain at bedside.

## 2017-06-02 NOTE — ED Notes (Signed)
Per Dr. Don PerkingVeronese, waiting for medical examiner to confirm the patient died of natural causes.

## 2017-06-02 NOTE — Code Documentation (Addendum)
Pt arrived via EMS from home, pt was found to be slumped over by husband who placed pt on the ground and started CPR after not feeling a pulse.  Per EMS, pt's husband reported that she had been anxious overnight and did not go to bed.  He also states that she only drank gingerale last night and did not eat.  Per EMS pt had approximately 2-3 mins of down time before CPR started.  Per EMS ROSC achieved at 0701 until 0732 when pulses were lost again.  EMS reports pt rhythm was in asystole.  Pt was given 4 epi, 1 am bicarb, 1 calcium and 1 am D50 for CBG of 27, prior to arrival CBG after D50 was 37.   EMS placed 6.5 ETT at 23 at the lip.  Pt is on hemodialysis with days on TTS, was given an extra treatment yesterday.  Pt has IO placed in left TIB

## 2017-06-02 NOTE — ED Provider Notes (Signed)
[facility] Emergency Department Provider Note  ____________________________________________  Time seen: Approximately 8:16 AM  I have reviewed the triage vital signs and the nursing notes.   HISTORY  Chief Complaint Cardiac Arrest  Level 5 caveat:  Portions of the history and physical were unable to be obtained due to cardiac arrest   HPI Brittany Ramirez is a 52 y.o. female with history listed below who presents for cardiac arrest with ongoing CPR. Patient was at home when she was found slumped over by her fiancee. He started CPR immediately after not feeling a pulse and called 911. Estimated downtime of 2-3 minutes. When EMS arrived patient was found to be in asystole without a pulse. Patient was intubated with a 6.5 ET tube by EMS. patient was found to be hypoglycemic with a blood glucose of 27. She was given an amp of D50. She was also given an amp of calcium, one of bicarbonate, and 4 rounds of epi. Patient had temporary ROSC prior to arrival but lost pulses again en route. CPR was continued in route with [facility] machine until arrival to the ED. Last HD treatment was yesterday here in the Hospital.  Past Medical History:  Diagnosis Date  . COPD (chronic obstructive pulmonary disease) (HCC)   . Cryptogenic organizing pneumonia (HCC)   . End stage renal disease (HCC)   . Essential hypertension   . Restrictive lung disease     Patient Active Problem List   Diagnosis Date Noted  . Pulmonary edema 05/06/2017  . Fluid overload 05/06/2017  . COPD exacerbation (HCC) 02/13/2015  . Acute respiratory failure (HCC) 02/13/2015  . Pneumonia 02/13/2015  . CHF (congestive heart failure) (HCC) 02/12/2015    Past Surgical History:  Procedure Laterality Date  . AV fistula right lower arm Right     Prior to Admission medications   Medication Sig Start Date End Date Taking? Authorizing Provider  albuterol (PROVENTIL HFA;VENTOLIN HFA) 108 (90 Base) MCG/ACT  inhaler Inhale 1-2 puffs into the lungs every 6 (six) hours as needed for wheezing. 05/02/17   Rolland Porter, MD  calcium carbonate (TUMS - DOSED IN MG ELEMENTAL CALCIUM) 500 MG chewable tablet Chew 2 tablets by mouth 3 (three) times daily with meals.     [provider]  carvedilol (COREG) 25 MG tablet Take 25 mg by mouth 2 (two) times daily.    [provider]  cinacalcet (SENSIPAR) 30 MG tablet Take 120 mg by mouth at bedtime.     [provider]  NIFEdipine (PROCARDIA XL/ADALAT-CC) 90 MG 24 hr tablet Take 90 mg by mouth daily. Monday, Wednesday, Friday morning pt takes 90 mg, with carvedilol 25mg . Monday, Wednesday, Friday night pt takes 60mg  alone. Tuesday, Thursday Saturday, and Sunday Takes 90mg , Twice daily along with Carvedilol 25 mg twice daily.    [provider]  predniSONE (DELTASONE) 5 MG tablet Take 5 mg by mouth daily.     [provider]    Allergies Iron; Peanuts [peanut oil]; Penicillins; Potassium-containing compounds; Septra [sulfamethoxazole-trimethoprim]; Skelaxin [metaxalone]; Other; Erythromycin; and Iodine  Family History  Problem Relation Age of Onset  . Diabetes Sister     Social History Social History   Tobacco Use  . Smoking status: Former Smoker    Types: Cigarettes  . Smokeless tobacco: Never Used  Substance Use Topics  . Alcohol use: Yes    Comment: occas.   . Drug use: Not on file    Review of Systems  + cardiac  arrest ____________________________________________   PHYSICAL EXAM:  VITAL SIGNS: none  Constitutional: GCS 3T, intubated HEENT:      Head: Normocephalic and atraumatic.   Enlarged temporal blood vessels      Eyes: Pupils fixed and dilated      Mouth/Throat: ETT in place      Neck: Supple with no signs of meningismus. Cardiovascular: [facility], good femoral pulses. Respiratory: Bilateral breath sounds Gastrointestinal: distended Musculoskeletal: R pedal edema Neurologic:  GCS 3T Skin: Skin is warm, dry and intact. No rash noted  ____________________________________________   LABS (all labs ordered are listed, but only abnormal results are displayed)  Labs Reviewed - No data to display ____________________________________________  EKG  none  ____________________________________________  RADIOLOGY  none  ____________________________________________   PROCEDURES  Procedure(s) performed: None Procedures Critical Care performed: yes  CRITICAL CARE Performed by: Nita Sicklearolina Athena Baltz  ?  Total critical care time: 35 min  Critical care time was exclusive of separately billable procedures and treating other patients.  Critical care was necessary to treat or prevent imminent or life-threatening deterioration.  Critical care was time spent personally by me on the following activities: development of treatment plan with patient and/or surrogate as well as nursing, discussions with consultants, evaluation of patient's response to treatment, examination of patient, obtaining history from patient or surrogate, ordering and performing treatments and interventions, ordering and review of laboratory studies, ordering and review of radiographic studies, pulse oximetry and re-evaluation of patient's condition.  ____________________________________________   INITIAL IMPRESSION / ASSESSMENT AND PLAN / ED COURSE   52 y.o. female with history listed below who presents for cardiac arrest with ongoing CPR. downtime estimated 2-3 minutes. Initial rhythm of asystole. Temporary ROSC achieved prior to arrival. Patient [facility]. ETT 6.5 at 23 at the lip with bilateral breath sounds. Patient placed on monitor with asystole. Hypoglycemic. Patient received 2 amps of the D50 and was started on D5 infusion. [facility] per ACLS protocol. Patient received several rounds of epinephrine, bicarbonate, calcium. Repeat BG  remained stable in the 200s. Bedside US showing no cardiac activity, normal size ventricles with no enlarged right ventricle, no pericardial effusion, and no evidence of PTX. In spite of all resuscitative efforts patient remained in asystole and time of death was called at [phone] [id number]. Case discussed with the [place] office. Steffanie RainwaterFiancee was brought to the room. Chaplain called.       As part of my medical decision making, I reviewed the following data within the electronic MEDICAL RECORD NUMBER History obtained from family, Nursing notes reviewed and incorporated, Old chart reviewed, Notes from prior ED visits and [place] Controlled Substance Database    Pertinent labs & imaging results that were available during my care of the patient were reviewed by me and considered in my medical decision making (see chart for details).    ____________________________________________   FINAL CLINICAL IMPRESSION(S) / ED DIAGNOSES  Final diagnoses:  Cardiac arrest (HCC)      NEW MEDICATIONS STARTED DURING THIS VISIT:  ED Discharge Orders    None       Note:  This document was prepared using Dragon voice recognition software and may include unintentional dictation errors.    Don PerkingVeronese, [place], MD 09/23/2017 [phone]

## 2017-06-02 NOTE — Progress Notes (Signed)
Chaplain responded at 08:31 to provide care to the patient's finance, Eddie. Chaplain provided grief support, active listening, and prayer. Chaplain was requested in the CCU and left the patient's room. Chaplain returned at 10:05 to meet patient's cousin Chemia and other relatives. Chaplain walked family to the patient's room and continued to provide emotional, spiritual, and grief support.

## 2017-06-02 NOTE — ED Notes (Signed)
Per Link SnufferEddie, the patient's fiance, he already informed patient's 3 sisters and cousin Chemia of patient's death and will be arriving to ED later this morning.

## 2017-06-02 NOTE — ED Notes (Signed)
ETT removed, IO removed and 3 PIV lines removed.

## 2017-06-02 DEATH — deceased

## 2017-07-04 IMAGING — CR DG FEMUR 2+V*L*
3 series · 3 of 3 positions shown · non-contrast
Comparison: Left hip series performed today. Pelvic MRI 10/19/2007.
Pelvic CT 11/15/2006.

CLINICAL DATA: Fall.  Left hip pain.

EXAM:
LEFT FEMUR 2 VIEWS

[femur ap (1 of 2)]
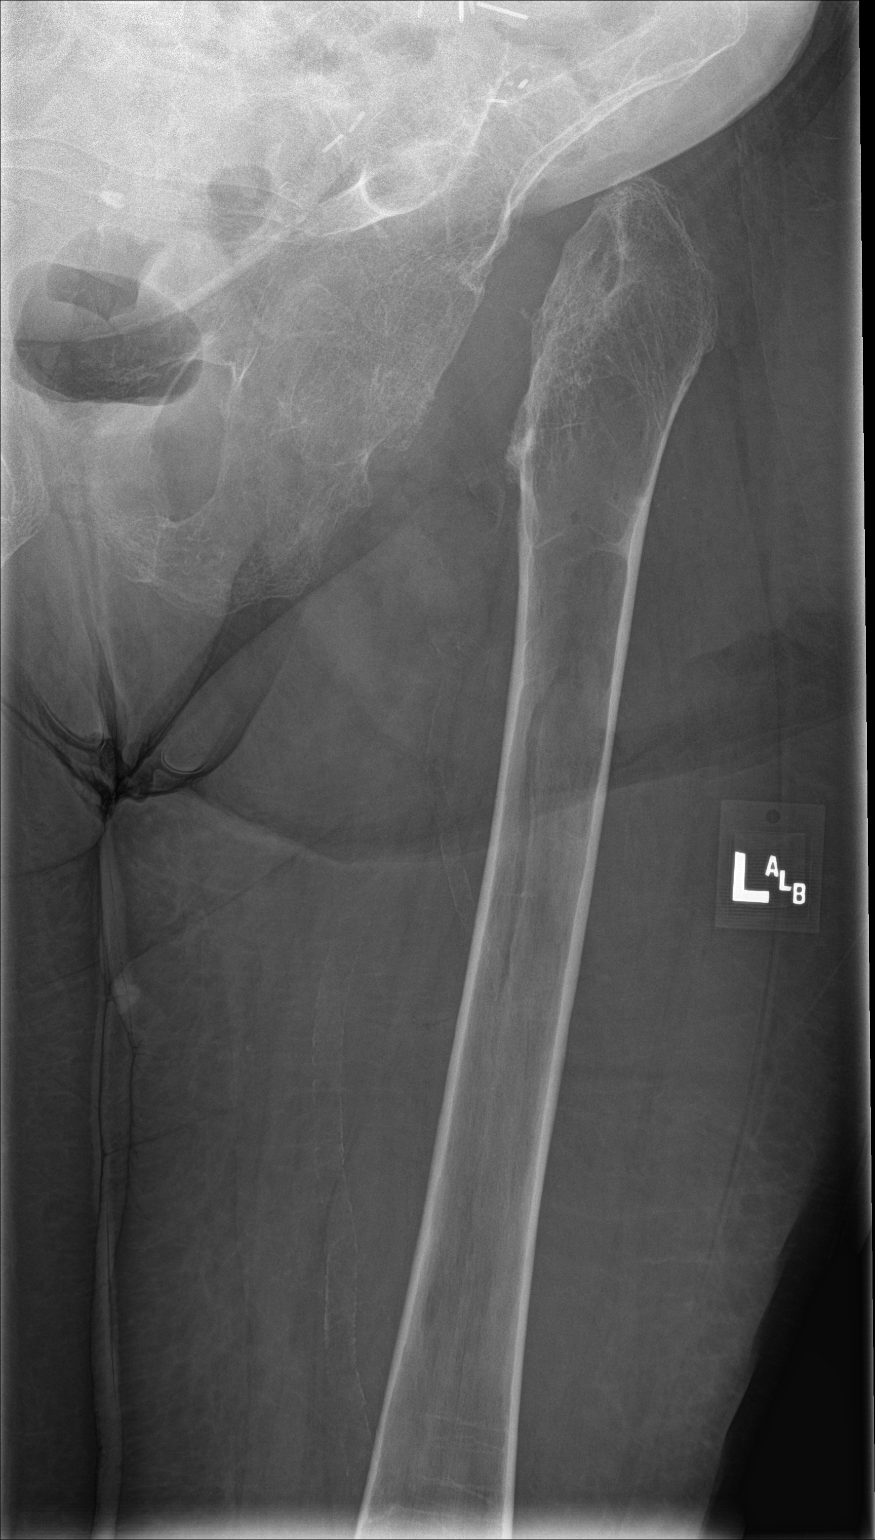

[femur ap (2 of 2)]
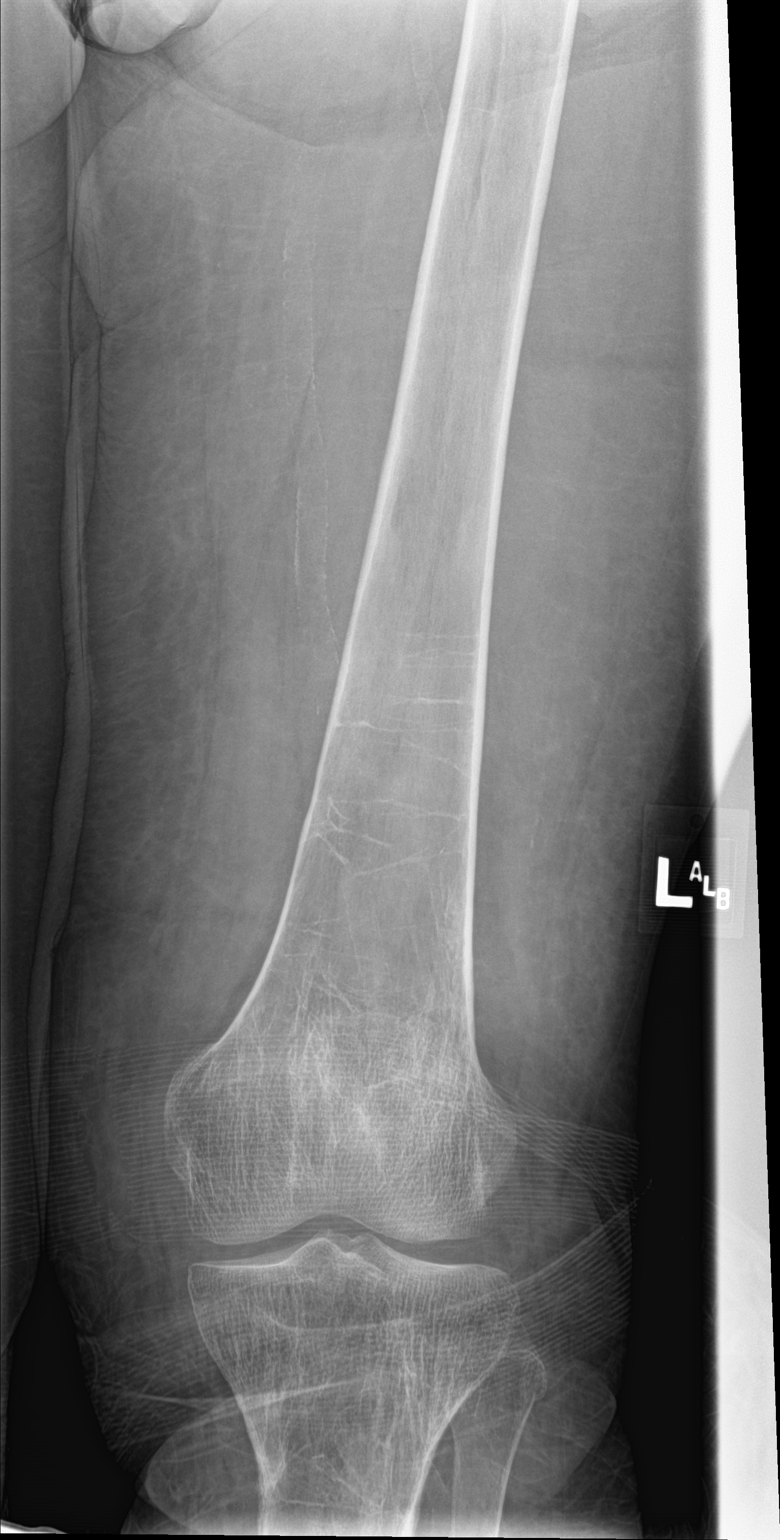

[femur lat]
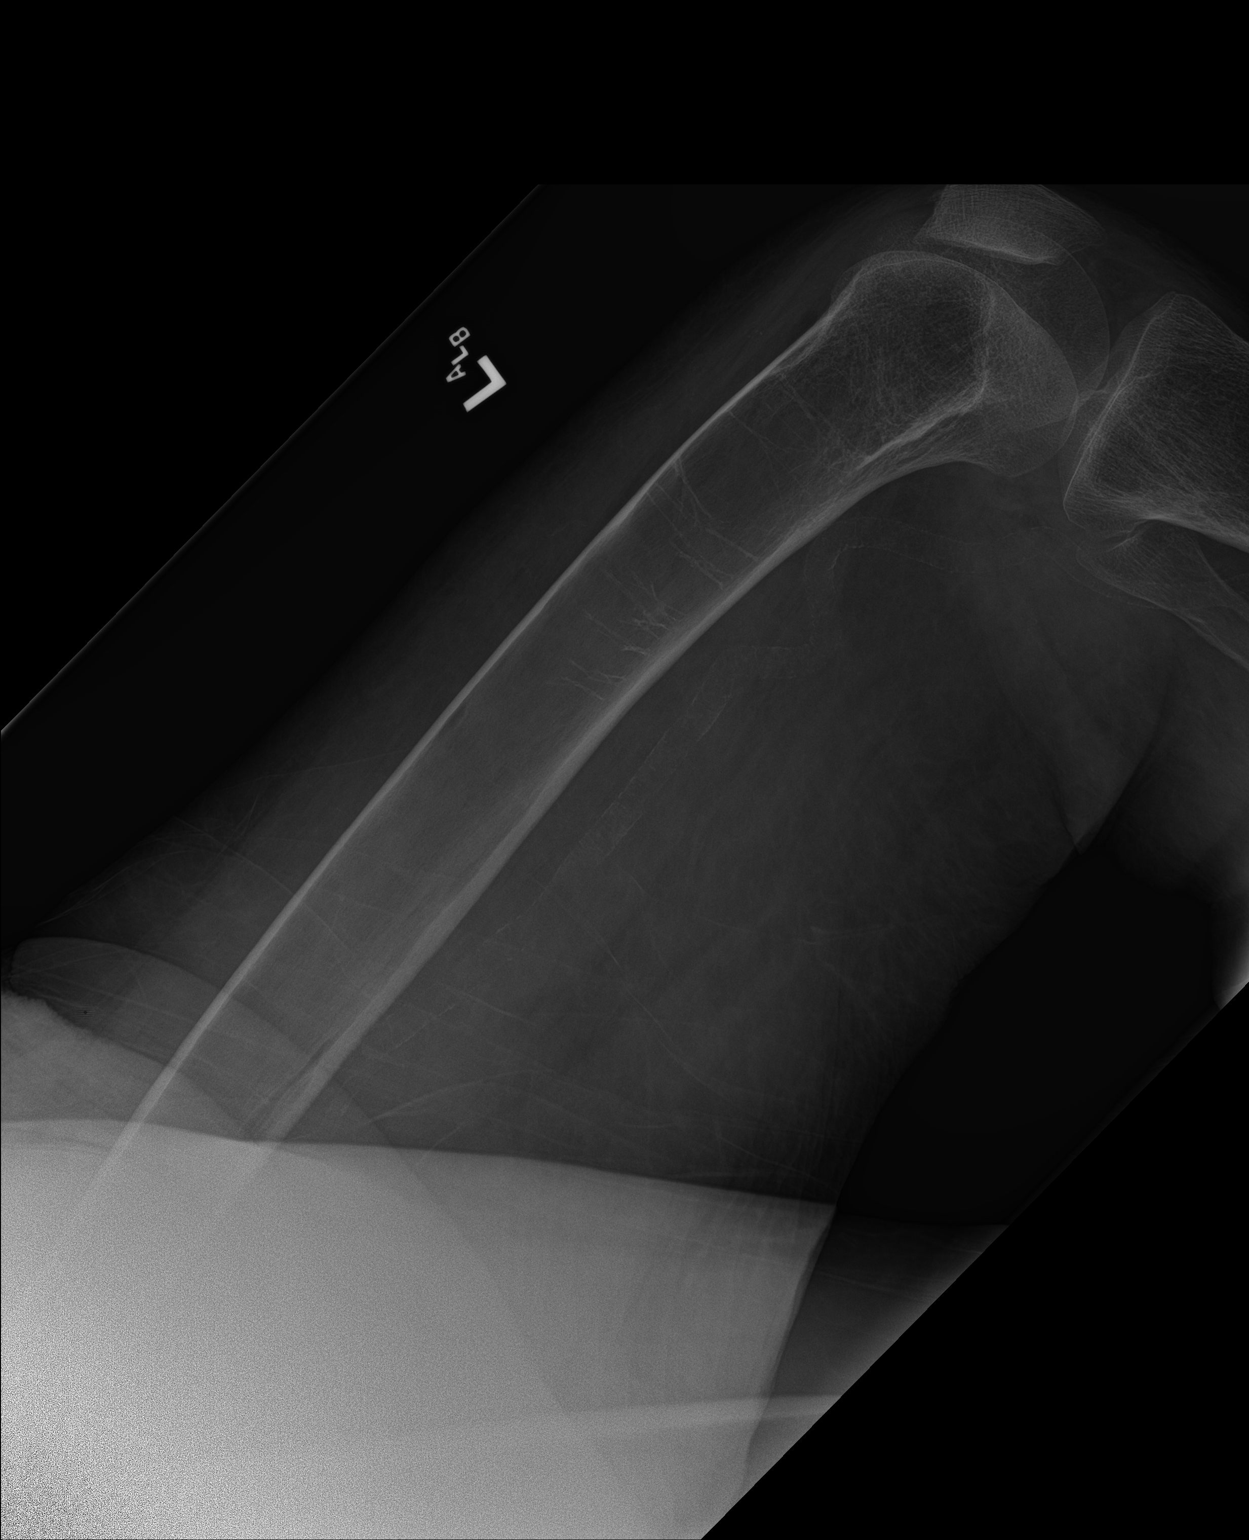

[3 of 3 positions shown; findings below may reference images not displayed]

FINDINGS: Chronic deformity of the proximal left femur with prior resection or
resorption of the left femoral head. Chronic femoral neck fracture
again noted with superior translation of the femoral shaft relative
to the deformed left acetabulum. Trabecular and subperiosteal
resorption of the bone throughout much of the femur is stable since
prior CT. No acute fracture.
IMPRESSION: Chronic left femoral neck fracture with bony resorption or resection
of the femoral head. Superior translation of the femoral shaft
relative to the deformed acetabulum.

No visible acute bony abnormality.

## 2018-07-22 IMAGING — CR DG CHEST 2V
2 series · 2 of 2 positions shown · non-contrast
Comparison: 02/13/2015

CLINICAL DATA: Decreased blood pressure. Shortness of breath.
Dialysis patient.

EXAM:
CHEST - 2 VIEW

[chest pa]
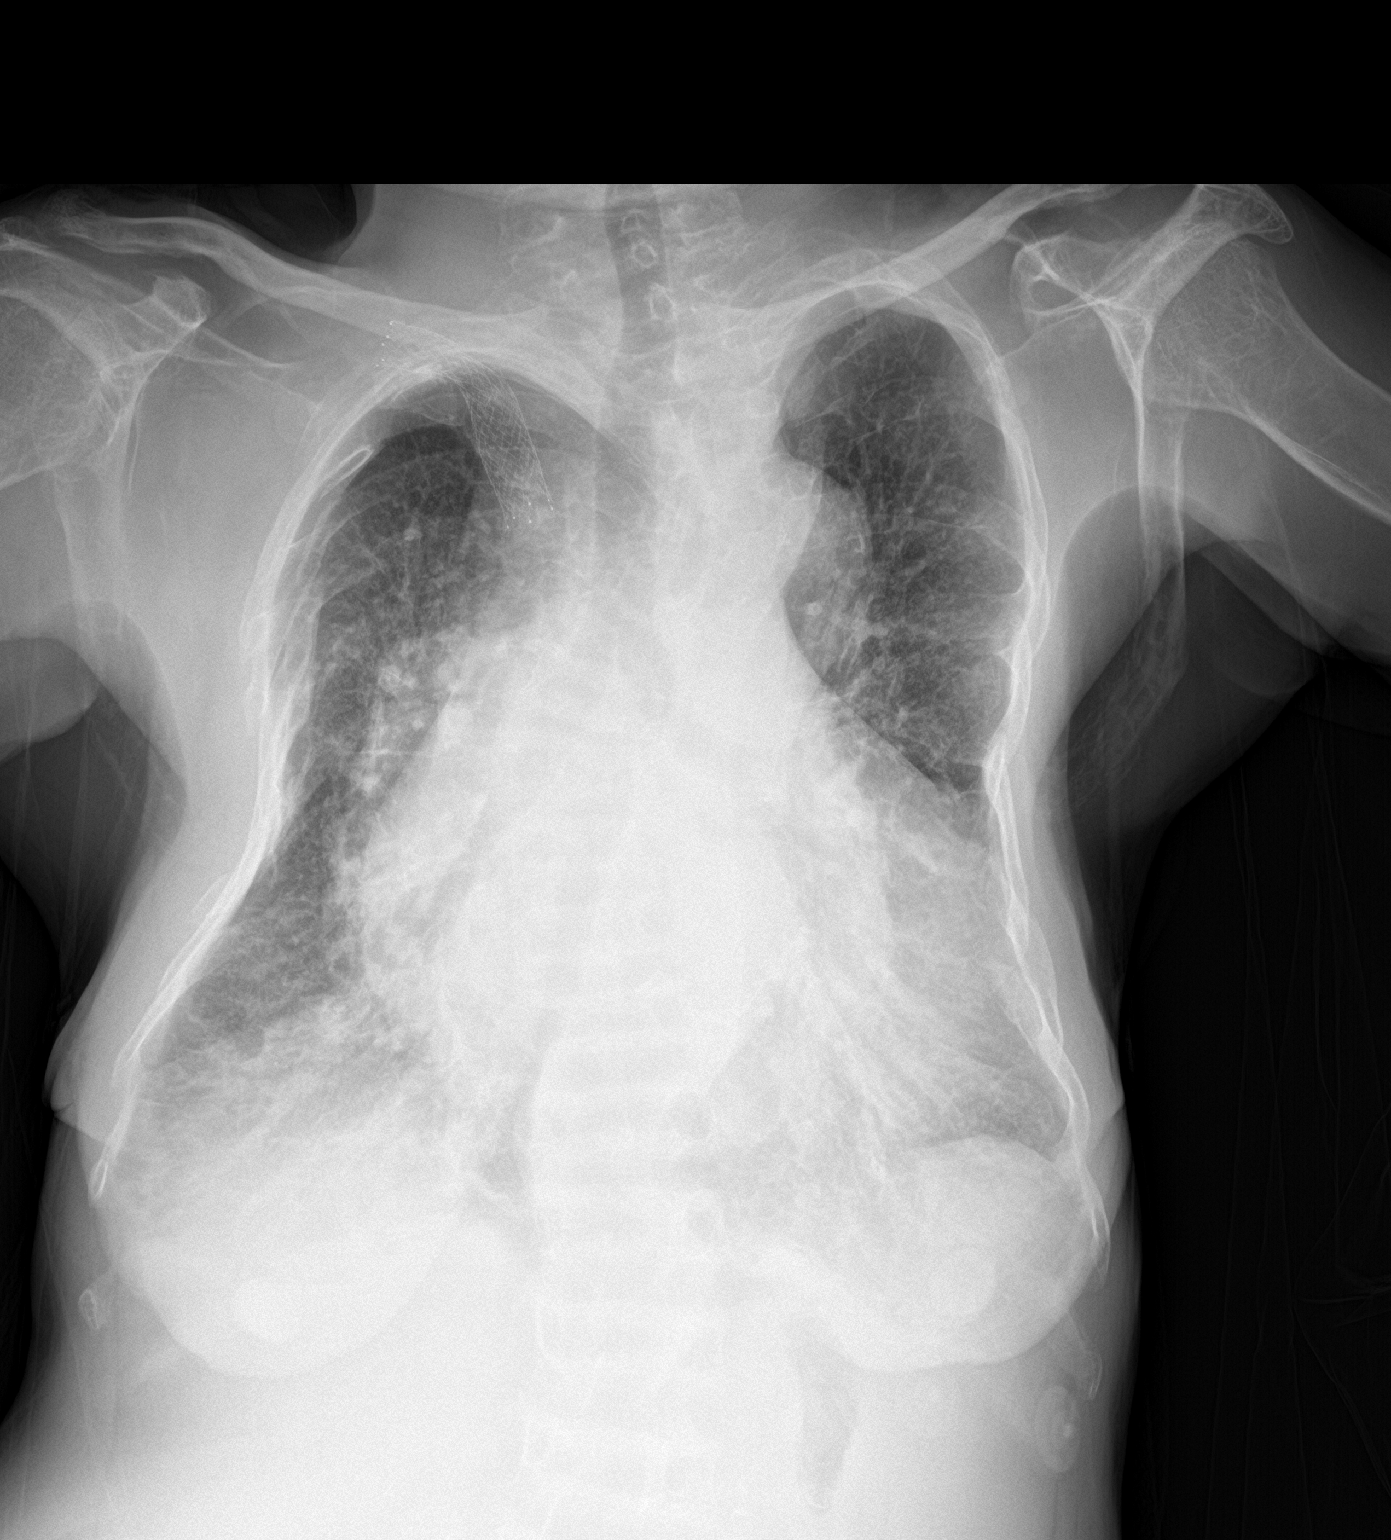

[chest lat]
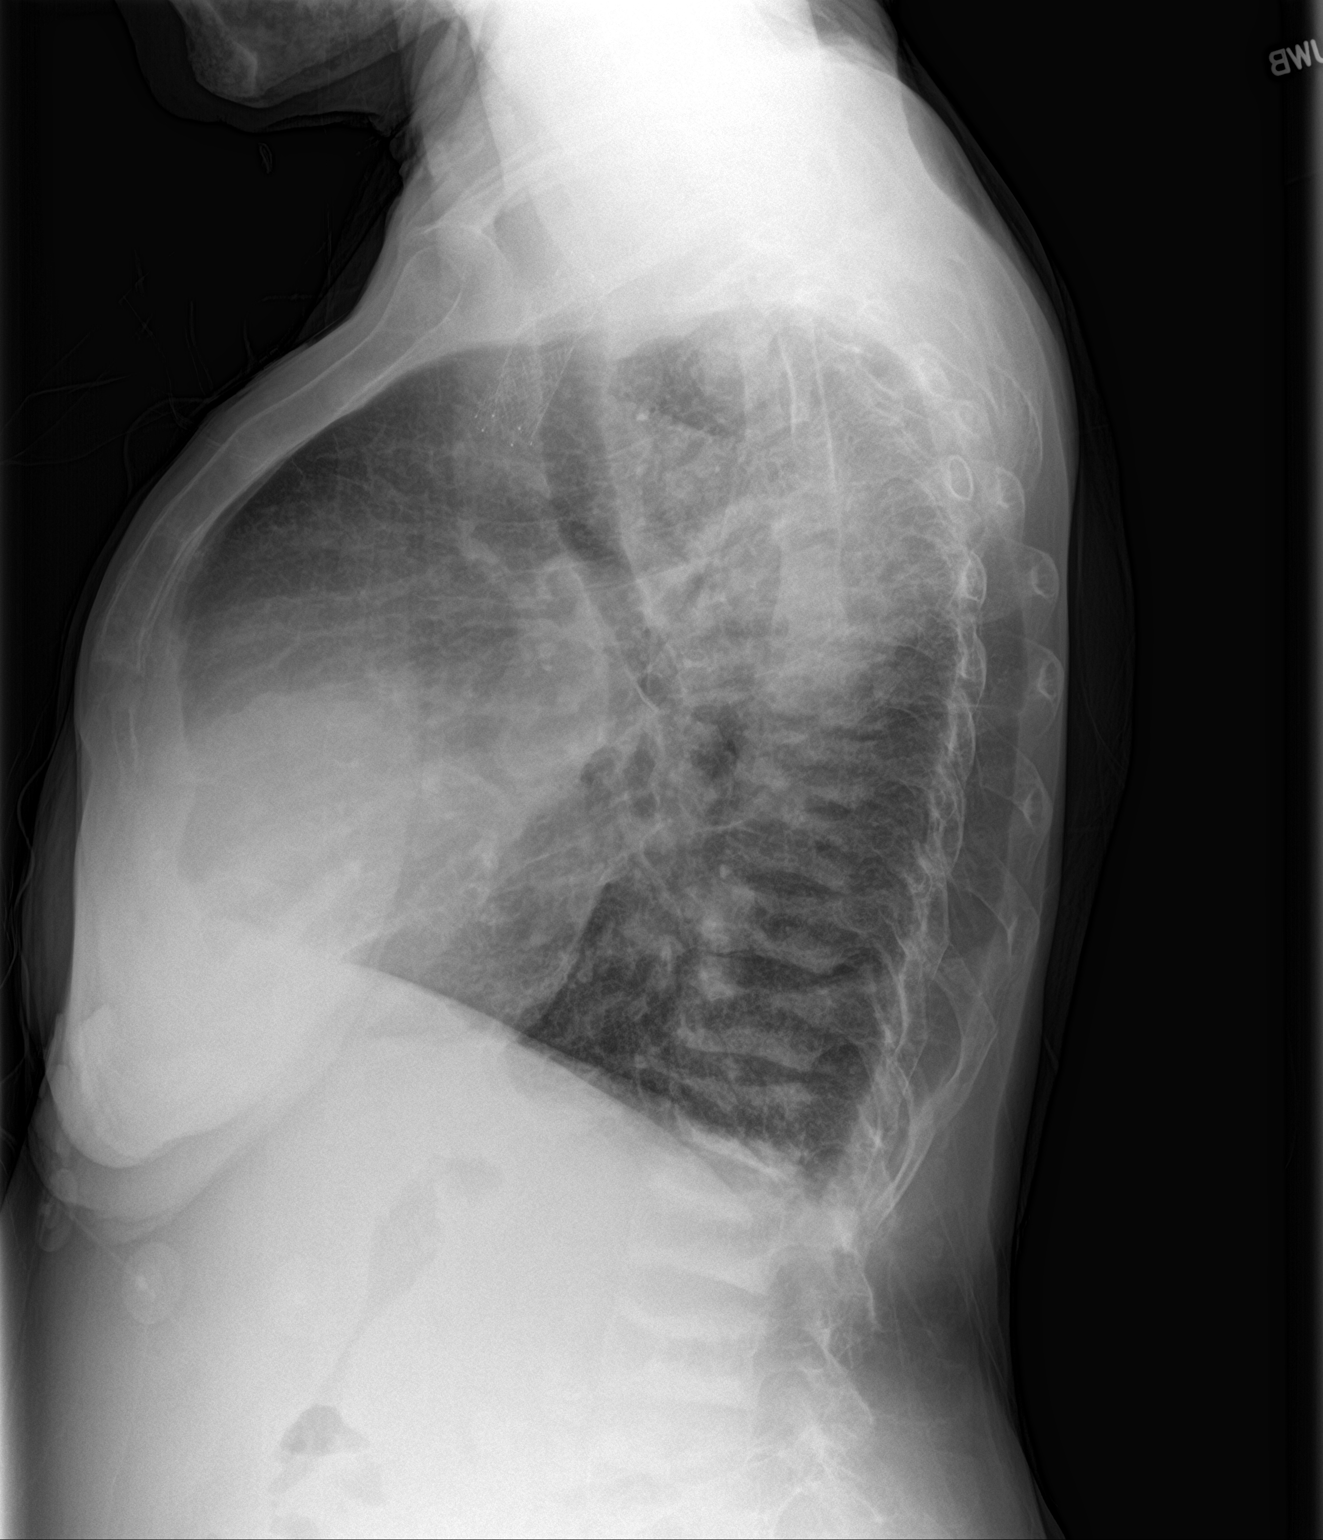

[2 of 2 positions shown; findings below may reference images not displayed]

FINDINGS: Hyperinflation. Abnormal appearance of the vertebral bodies is
likely related to renal osteodystrophy. Right subclavian stent.
Midline trachea. Marked cardiomegaly. No pleural effusion or
pneumothorax. Mild interstitial prominence. No lobar consolidation.
IMPRESSION: Cardiomegaly and mild pulmonary venous congestion.

## 2018-07-26 IMAGING — DX DG CHEST 1V PORT
1 series · 1 of 1 positions shown · non-contrast
Comparison: 05/02/2017

CLINICAL DATA: Respiratory distress

EXAM:
PORTABLE CHEST 1 VIEW

[chest ap]
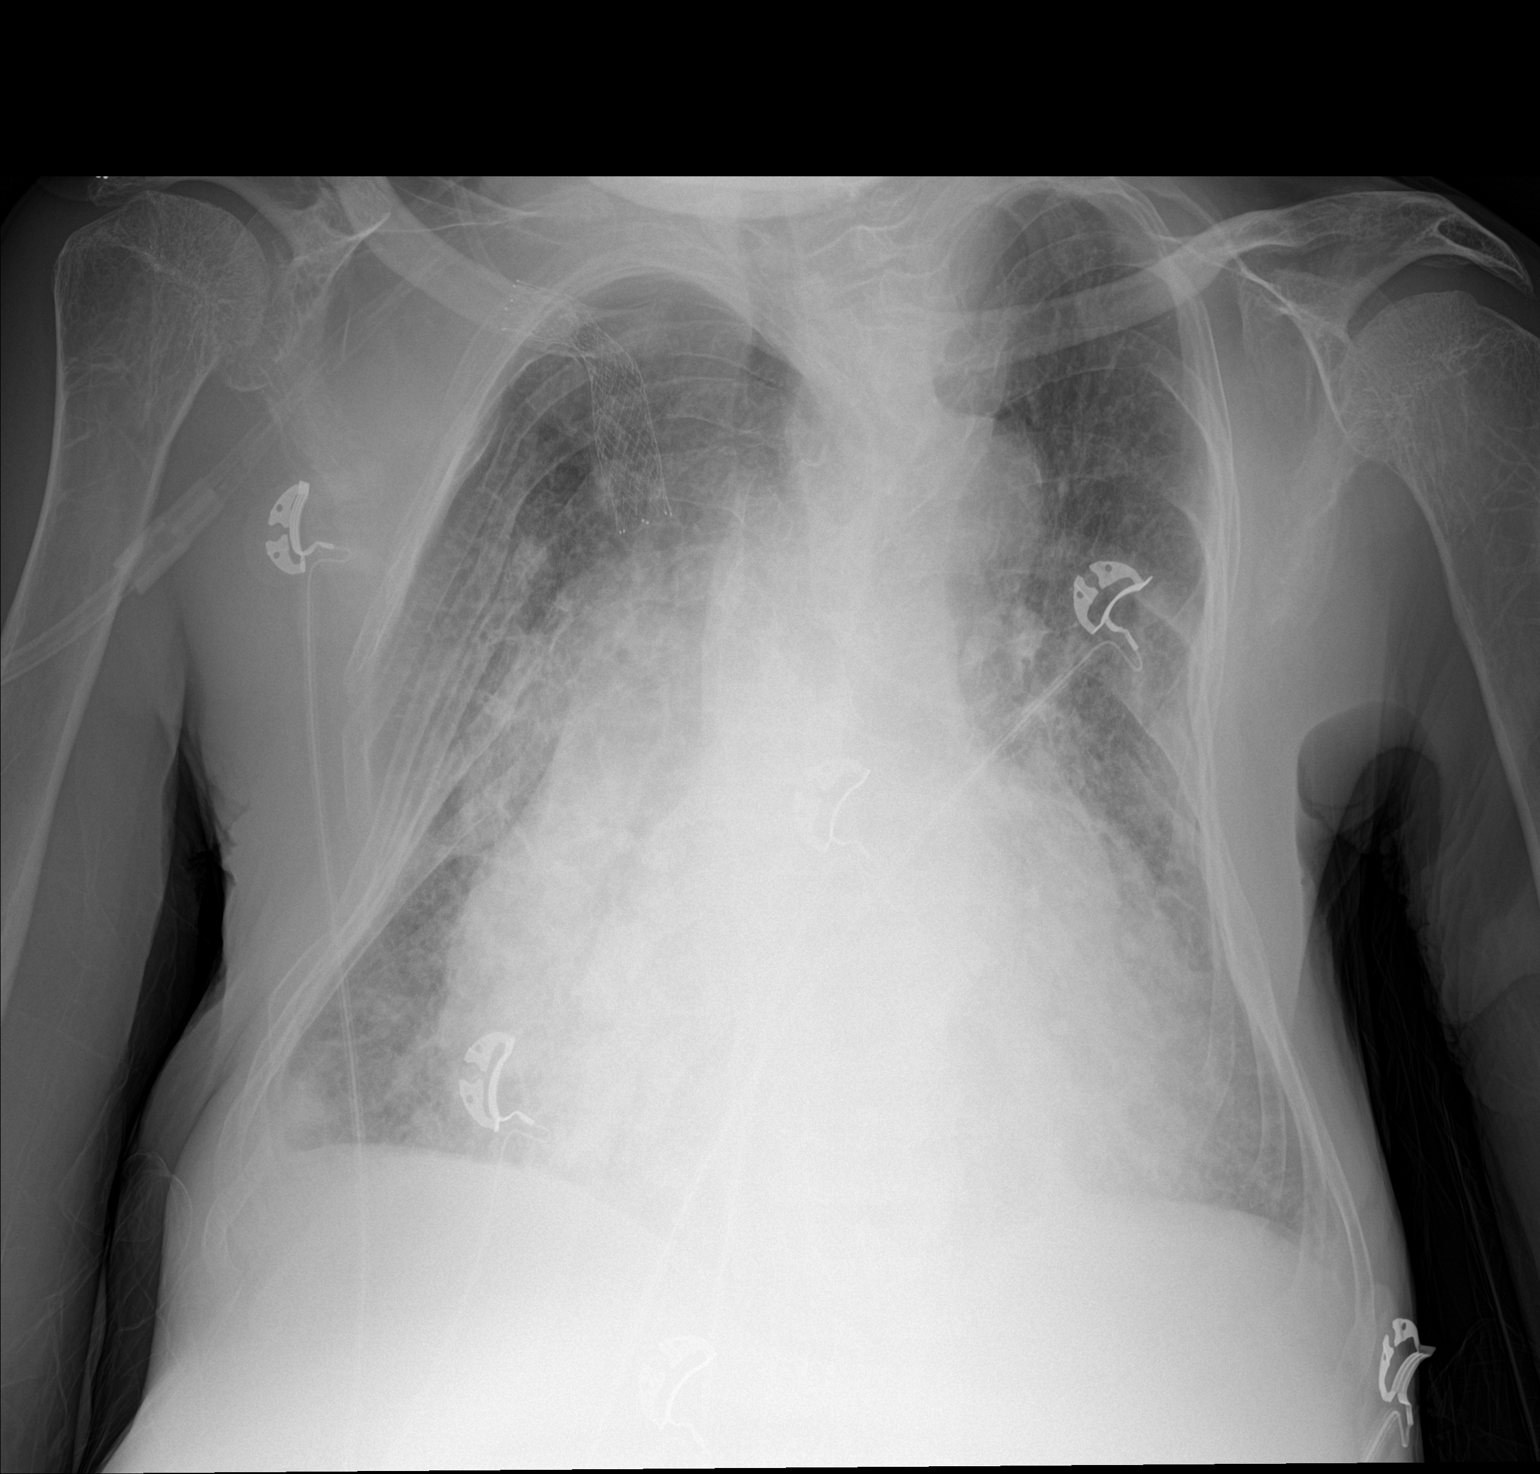

[1 of 1 positions shown; findings below may reference images not displayed]

FINDINGS: Prominent diffuse cardiac enlargement. Pulmonary vascular
congestion. Hazy infiltrates over the lungs likely representing
edema. There appears to be progression since the previous study.
Blunting of the costophrenic angles suggesting small pleural
effusions. Calcification of the aorta. No pneumothorax. Vascular
graft in the right subclavian region. Old bilateral rib fracture
deformities.
IMPRESSION: Cardiac enlargement with increasing vascular congestion and edema.
Small bilateral pleural effusions. Progression of CHF changes.
